# Patient Record
Sex: Female | Born: 1970
Health system: Southern US, Community
[De-identification: ages and names within clinical notes are randomized; demographics above are authoritative.]

## PROBLEM LIST (undated history)

## (undated) DIAGNOSIS — I10 Essential (primary) hypertension: Secondary | ICD-10-CM

## (undated) DIAGNOSIS — R519 Headache, unspecified: Secondary | ICD-10-CM

## (undated) DIAGNOSIS — E559 Vitamin D deficiency, unspecified: Secondary | ICD-10-CM

## (undated) DIAGNOSIS — Z8489 Family history of other specified conditions: Secondary | ICD-10-CM

## (undated) DIAGNOSIS — M549 Dorsalgia, unspecified: Secondary | ICD-10-CM

## (undated) DIAGNOSIS — T4145XA Adverse effect of unspecified anesthetic, initial encounter: Secondary | ICD-10-CM

## (undated) DIAGNOSIS — K760 Fatty (change of) liver, not elsewhere classified: Secondary | ICD-10-CM

## (undated) DIAGNOSIS — D649 Anemia, unspecified: Secondary | ICD-10-CM

## (undated) DIAGNOSIS — M069 Rheumatoid arthritis, unspecified: Secondary | ICD-10-CM

## (undated) DIAGNOSIS — J189 Pneumonia, unspecified organism: Secondary | ICD-10-CM

## (undated) DIAGNOSIS — F419 Anxiety disorder, unspecified: Secondary | ICD-10-CM

## (undated) DIAGNOSIS — F988 Other specified behavioral and emotional disorders with onset usually occurring in childhood and adolescence: Secondary | ICD-10-CM

## (undated) DIAGNOSIS — T8859XA Other complications of anesthesia, initial encounter: Secondary | ICD-10-CM

## (undated) DIAGNOSIS — R51 Headache: Secondary | ICD-10-CM

## (undated) DIAGNOSIS — D219 Benign neoplasm of connective and other soft tissue, unspecified: Secondary | ICD-10-CM

## (undated) DIAGNOSIS — M797 Fibromyalgia: Secondary | ICD-10-CM

## (undated) HISTORY — DX: Vitamin D deficiency, unspecified: E55.9

## (undated) HISTORY — DX: Fatty (change of) liver, not elsewhere classified: K76.0

## (undated) HISTORY — DX: Other specified behavioral and emotional disorders with onset usually occurring in childhood and adolescence: F98.8

## (undated) HISTORY — DX: Morbid (severe) obesity due to excess calories: E66.01

## (undated) HISTORY — DX: Benign neoplasm of connective and other soft tissue, unspecified: D21.9

## (undated) HISTORY — DX: Anxiety disorder, unspecified: F41.9

## (undated) HISTORY — PX: BREAST BIOPSY: SHX20

## (undated) HISTORY — PX: CARPAL TUNNEL RELEASE: SHX101

## (undated) HISTORY — DX: Rheumatoid arthritis, unspecified: M06.9

## (undated) HISTORY — PX: WISDOM TOOTH EXTRACTION: SHX21

## (undated) HISTORY — DX: Fibromyalgia: M79.7

---

## 2011-02-17 DIAGNOSIS — M549 Dorsalgia, unspecified: Secondary | ICD-10-CM | POA: Insufficient documentation

## 2011-02-17 DIAGNOSIS — Z6841 Body Mass Index (BMI) 40.0 and over, adult: Secondary | ICD-10-CM | POA: Insufficient documentation

## 2012-01-16 LAB — BASIC METABOLIC PANEL: BUN: 10 mg/dL (ref 4–21)

## 2012-01-16 LAB — HEPATIC FUNCTION PANEL
Alkaline Phosphatase: 73 U/L (ref 25–125)
Bilirubin, Total: 0.2 mg/dL

## 2012-01-16 LAB — HEMOGLOBIN A1C: Hgb A1c MFr Bld: 5.4 % (ref 4.0–6.0)

## 2012-12-19 ENCOUNTER — Ambulatory Visit (INDEPENDENT_AMBULATORY_CARE_PROVIDER_SITE_OTHER): Payer: BC Managed Care – PPO | Admitting: Family Medicine

## 2012-12-19 ENCOUNTER — Encounter: Payer: Self-pay | Admitting: Family Medicine

## 2012-12-19 VITALS — BP 124/87 | HR 87 | Ht 63.0 in | Wt 228.0 lb

## 2012-12-19 DIAGNOSIS — M5416 Radiculopathy, lumbar region: Secondary | ICD-10-CM | POA: Insufficient documentation

## 2012-12-19 DIAGNOSIS — G8929 Other chronic pain: Secondary | ICD-10-CM

## 2012-12-19 DIAGNOSIS — M47817 Spondylosis without myelopathy or radiculopathy, lumbosacral region: Secondary | ICD-10-CM | POA: Insufficient documentation

## 2012-12-19 DIAGNOSIS — Z1231 Encounter for screening mammogram for malignant neoplasm of breast: Secondary | ICD-10-CM

## 2012-12-19 DIAGNOSIS — M5430 Sciatica, unspecified side: Secondary | ICD-10-CM

## 2012-12-19 DIAGNOSIS — M5431 Sciatica, right side: Secondary | ICD-10-CM

## 2012-12-19 DIAGNOSIS — M533 Sacrococcygeal disorders, not elsewhere classified: Secondary | ICD-10-CM | POA: Insufficient documentation

## 2012-12-19 DIAGNOSIS — M545 Low back pain: Secondary | ICD-10-CM

## 2012-12-19 MED ORDER — GABAPENTIN 300 MG PO CAPS: ORAL_CAPSULE | ORAL | Status: DC

## 2012-12-19 MED ORDER — HYDROCODONE-ACETAMINOPHEN 5-325 MG PO TABS: 1.0000 | ORAL_TABLET | Freq: Three times a day (TID) | ORAL | Status: DC | PRN

## 2012-12-19 MED ORDER — PREDNISONE 20 MG PO TABS: 40.0000 mg | ORAL_TABLET | Freq: Every day | ORAL | Status: DC
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
## 2012-12-25 ENCOUNTER — Encounter: Payer: Self-pay | Admitting: Family Medicine

## 2012-12-25 DIAGNOSIS — D259 Leiomyoma of uterus, unspecified: Secondary | ICD-10-CM | POA: Insufficient documentation

## 2012-12-26 ENCOUNTER — Ambulatory Visit (INDEPENDENT_AMBULATORY_CARE_PROVIDER_SITE_OTHER): Payer: BC Managed Care – PPO | Admitting: Physical Therapy

## 2012-12-26 ENCOUNTER — Encounter: Payer: Self-pay | Admitting: *Deleted

## 2012-12-26 DIAGNOSIS — M2550 Pain in unspecified joint: Secondary | ICD-10-CM

## 2012-12-26 DIAGNOSIS — G8929 Other chronic pain: Secondary | ICD-10-CM

## 2012-12-26 DIAGNOSIS — M2560 Stiffness of unspecified joint, not elsewhere classified: Secondary | ICD-10-CM

## 2012-12-26 DIAGNOSIS — M545 Low back pain: Secondary | ICD-10-CM

## 2012-12-31 ENCOUNTER — Ambulatory Visit (HOSPITAL_BASED_OUTPATIENT_CLINIC_OR_DEPARTMENT_OTHER)
Admission: RE | Admit: 2012-12-31 | Discharge: 2012-12-31 | Disposition: A | Discharge status: Home / Self Care | Payer: BC Managed Care – PPO | Source: Ambulatory Visit | Attending: Family Medicine | Admitting: Family Medicine

## 2012-12-31 DIAGNOSIS — R928 Other abnormal and inconclusive findings on diagnostic imaging of breast: Secondary | ICD-10-CM | POA: Insufficient documentation

## 2012-12-31 DIAGNOSIS — Z1231 Encounter for screening mammogram for malignant neoplasm of breast: Secondary | ICD-10-CM

## 2013-01-02 ENCOUNTER — Ambulatory Visit (INDEPENDENT_AMBULATORY_CARE_PROVIDER_SITE_OTHER): Payer: BC Managed Care – PPO | Admitting: Physical Therapy

## 2013-01-02 DIAGNOSIS — G8929 Other chronic pain: Secondary | ICD-10-CM

## 2013-01-02 DIAGNOSIS — M545 Low back pain: Secondary | ICD-10-CM

## 2013-01-02 DIAGNOSIS — M2560 Stiffness of unspecified joint, not elsewhere classified: Secondary | ICD-10-CM

## 2013-01-02 DIAGNOSIS — M2550 Pain in unspecified joint: Secondary | ICD-10-CM

## 2013-01-10 ENCOUNTER — Encounter: Payer: BC Managed Care – PPO | Admitting: Physical Therapy

## 2013-01-14 ENCOUNTER — Encounter: Payer: BC Managed Care – PPO | Admitting: Physical Therapy

## 2013-01-16 ENCOUNTER — Encounter: Payer: BC Managed Care – PPO | Admitting: Physical Therapy

## 2013-01-19 ENCOUNTER — Emergency Department
Admission: EM | Admit: 2013-01-19 | Discharge: 2013-01-19 | Disposition: A | Discharge status: Home / Self Care | Payer: BC Managed Care – PPO | Source: Home / Self Care | Attending: Family Medicine | Admitting: Family Medicine

## 2013-01-19 ENCOUNTER — Encounter: Payer: Self-pay | Admitting: Emergency Medicine

## 2013-01-19 DIAGNOSIS — J069 Acute upper respiratory infection, unspecified: Secondary | ICD-10-CM

## 2013-01-19 HISTORY — DX: Dorsalgia, unspecified: M54.9

## 2013-01-19 MED ORDER — AMOXICILLIN 875 MG PO TABS: 875.0000 mg | ORAL_TABLET | Freq: Two times a day (BID) | ORAL | Status: DC

## 2013-01-19 MED ORDER — BENZONATATE 100 MG PO CAPS: ORAL_CAPSULE | ORAL | Status: DC

## 2013-01-19 NOTE — ED Provider Notes (Signed)
CSN: [id number]     Arrival date & time 01/19/13  0932 History   First MD Initiated Contact with Patient 01/19/13 [phone]     Chief Complaint  Patient presents with  . Facial Pain      HPI Comments: Patient complains of onset of cold-like symptoms 6 days ago with sinus congestion, followed by sore throat and a cough.  The sore throat resolved yesterday, and she still has the cough. She states that she has a history of pneumonia, with last episode January 2014.  She has had pneumonia about 5 times since 2003.  The history is provided by the patient.    Past Medical History  Diagnosis Date  . Back pain    Past Surgical History  Procedure Laterality Date  . Cesarean section  2000   Family History  Problem Relation Age of Onset  . Hypertension    . Diabetes Father    History  Substance Use Topics  . Smoking status: Former Smoker    Types: Cigarettes    Quit date: 04/20/2010  . Smokeless tobacco: Not on file  . Alcohol Use: Yes     Comment: social   OB History   Grav Para Term Preterm Abortions TAB SAB Ect Mult Living                 Review of Systems + sore throat, resolved + cough No pleuritic pain No wheezing + nasal congestion + post-nasal drainage + sinus pain/pressure + itchy/red eyes ? earache No hemoptysis No SOB No fever, + chills No nausea No vomiting No abdominal pain No diarrhea No urinary symptoms No skin rashes + fatigue No myalgias + headache Used OTC meds without relief  Allergies  Review of patient's allergies indicates no known allergies.  Home Medications   Current Outpatient Rx  Name  Route  Sig  Dispense  Refill  . amoxicillin (AMOXIL) 875 MG tablet   Oral   Take 1 tablet (875 mg total) by mouth 2 (two) times daily. (Rx void after 01/27/13)   20 tablet   0   . benzonatate (TESSALON) 100 MG capsule      Take one cap at bedtime as necessary for cough   12 capsule   0   . gabapentin (NEURONTIN) 300 MG capsule      Once a  day at bedtime x 1 week, then BID x 1 week, then TID   90 capsule   3   . HYDROcodone-acetaminophen (NORCO/VICODIN) 5-325 MG per tablet   Oral   Take 1 tablet by mouth every 8 (eight) hours as needed for pain.   30 tablet   0   . predniSONE (DELTASONE) 20 MG tablet   Oral   Take 2 tablets (40 mg total) by mouth daily.   10 tablet   0    BP 133/82  Pulse 96  Temp(Src) 98.1 F (36.7 C) (Oral)  Ht 5\' 3"  (1.6 m)  Wt 229 lb (103.874 kg)  BMI 40.58 kg/m2  SpO2 97% Physical Exam Nursing notes and Vital Signs reviewed. Appearance:  Patient appears stated age, and in no acute distress.  Patient is obese (BMI 40.6) Eyes:  Pupils are equal, round, and reactive to light and accomodation.  Extraocular movement is intact.  Conjunctivae are not inflamed  Ears:  Canals normal.  Tympanic membranes normal.  Nose:  Mildly congested turbinates.  No sinus tenderness.   Pharynx:  Normal Neck:  Supple.  Tender shotty posterior nodes are  palpated bilaterally  Lungs:  Clear to auscultation.  Breath sounds are equal. Chest:  Distinct tenderness to palpation over the mid-sternum.   Heart:  Regular rate and rhythm without murmurs, rubs, or gallops.  Abdomen:  Nontender without masses or hepatosplenomegaly.  Bowel sounds are present.  No CVA or flank tenderness.  Extremities:  No edema.  No calf tenderness Skin:  No rash present.   ED Course  Procedures  none       MDM   1. Acute upper respiratory infections of unspecified site; suspect viral URI    There is no evidence of bacterial infection today.   Treat symptomatically for now:  Prescription written for Benzonatate [facility]) to take at bedtime for night-time cough.  Take Mucinex D (guaifenesin with decongestant) twice daily for congestion, or plain Mucinex plus Sudafed.  Increase fluid intake, rest. May use Afrin nasal spray (or generic oxymetazoline) twice daily for about 5 days.  Also recommend using saline nasal spray several times  daily and saline nasal irrigation (AYR is a common brand) Stop all antihistamines for now, and other non-prescription cough/cold preparations. Begin Amoxicillin if not improving about one week or if persistent fever develops (Given a prescription to hold, with an expiration date)  Follow-up with family doctor if not improving about10 days.     Lattie Haw, MD 01/19/13 1027

## 2013-01-19 NOTE — ED Notes (Signed)
States she has not travel internationally. Noted sinus congestion on Monday and productive cough.

## 2013-01-21 ENCOUNTER — Encounter: Payer: BC Managed Care – PPO | Admitting: Physical Therapy

## 2013-01-23 ENCOUNTER — Encounter: Payer: BC Managed Care – PPO | Admitting: Physical Therapy

## 2013-01-25 ENCOUNTER — Other Ambulatory Visit: Payer: Self-pay | Admitting: Family Medicine

## 2013-01-25 DIAGNOSIS — R928 Other abnormal and inconclusive findings on diagnostic imaging of breast: Secondary | ICD-10-CM

## 2013-02-13 ENCOUNTER — Other Ambulatory Visit: Payer: Self-pay | Admitting: Radiology

## 2013-02-13 ENCOUNTER — Ambulatory Visit
Admission: RE | Admit: 2013-02-13 | Discharge: 2013-02-13 | Disposition: A | Discharge status: Home / Self Care | Payer: BC Managed Care – PPO | Source: Ambulatory Visit | Attending: Family Medicine | Admitting: Family Medicine

## 2013-02-13 ENCOUNTER — Ambulatory Visit: Payer: BC Managed Care – PPO | Admitting: Family Medicine

## 2013-02-13 ENCOUNTER — Other Ambulatory Visit: Payer: Self-pay | Admitting: Family Medicine

## 2013-02-13 DIAGNOSIS — R928 Other abnormal and inconclusive findings on diagnostic imaging of breast: Secondary | ICD-10-CM

## 2013-02-13 DIAGNOSIS — N6310 Unspecified lump in the right breast, unspecified quadrant: Secondary | ICD-10-CM

## 2013-02-20 ENCOUNTER — Ambulatory Visit (INDEPENDENT_AMBULATORY_CARE_PROVIDER_SITE_OTHER): Payer: BC Managed Care – PPO | Admitting: Family Medicine

## 2013-02-20 ENCOUNTER — Encounter: Payer: Self-pay | Admitting: Family Medicine

## 2013-02-20 VITALS — BP 132/76 | HR 98 | Temp 98.3°F | Wt 229.0 lb

## 2013-02-20 DIAGNOSIS — Z23 Encounter for immunization: Secondary | ICD-10-CM

## 2013-02-20 DIAGNOSIS — M545 Low back pain, unspecified: Secondary | ICD-10-CM

## 2013-02-20 DIAGNOSIS — G8929 Other chronic pain: Secondary | ICD-10-CM

## 2013-02-20 MED ORDER — HYDROCODONE-ACETAMINOPHEN 7.5-325 MG PO TABS: 1.0000 | ORAL_TABLET | Freq: Two times a day (BID) | ORAL | Status: DC | PRN

## 2013-02-20 NOTE — Progress Notes (Signed)
  Subjective:    Patient ID: Elizabeth Copeland, female    DOB: 1970-12-15, 42 y.o.   MRN: [id number]  HPI Acute on chronic back pain - estab care here 2 months ago for chronic low back pain. Started her back on neurontin. Had used it before. Given 30 hydrocodone to use over 2 month period. Had warned about dependency. Started PT and felt worse and was getting HA so quit going.  She feels better some. No longer radiating down her leg as well.  The weather change has exacerbated her sxs. Taking the neurontin BID. Feels like she is back to her baseline for her pain.     Review of Systems     Objective:   Physical Exam  Constitutional: She is oriented to person, place, and time. She appears well-developed and well-nourished.  HENT:  Head: Normocephalic and atraumatic.  Cardiovascular: Normal rate, regular rhythm and normal heart sounds.   Pulmonary/Chest: Effort normal and breath sounds normal.  Musculoskeletal:  Normall flexion, extension, rotation right and left.  + straight leg raise on the right.  Patellar reflexes 2+ bilat.    Neurological: She is alert and oriented to person, place, and time.  Skin: Skin is warm and dry.  Psychiatric: She has a normal mood and affect. Her behavior is normal.          Assessment & Plan:  Chronic low back pain - Unable to complete PT. Has been seeing chiropractor on and off.  Has been using her hydrocodone sparingly but says doesn't isn't as effective. Will inc hydrocodone to 7.5 mg. Given 60 tabs today.  Continue neurontin BID. Ok ot increase to TID if needed. Work on stretches and exercise.  Recommend heating pad at bedtime.  Can still use an NSAID prn on top of meds for pain relief as well. Her radicular sxs on the right are not completely resolved but are better.   Given flu shot today.

## 2013-04-10 ENCOUNTER — Encounter: Payer: Self-pay | Admitting: Family Medicine

## 2013-04-10 ENCOUNTER — Ambulatory Visit (INDEPENDENT_AMBULATORY_CARE_PROVIDER_SITE_OTHER): Payer: BC Managed Care – PPO | Admitting: Family Medicine

## 2013-04-10 VITALS — BP 119/73 | HR 86 | Temp 98.2°F | Wt 226.0 lb

## 2013-04-10 DIAGNOSIS — R5383 Other fatigue: Secondary | ICD-10-CM

## 2013-04-10 DIAGNOSIS — R300 Dysuria: Secondary | ICD-10-CM

## 2013-04-10 DIAGNOSIS — R5381 Other malaise: Secondary | ICD-10-CM

## 2013-04-10 DIAGNOSIS — Z0000 Encounter for general adult medical examination without abnormal findings: Secondary | ICD-10-CM

## 2013-04-10 LAB — POCT URINALYSIS DIPSTICK
Bilirubin, UA: NEGATIVE
GLUCOSE UA: NEGATIVE
Ketones, UA: NEGATIVE
LEUKOCYTES UA: NEGATIVE
NITRITE UA: NEGATIVE
Protein, UA: NEGATIVE
Spec Grav, UA: 1.025
Urobilinogen, UA: 0.2
pH, UA: 6

## 2013-04-10 MED ORDER — GABAPENTIN 300 MG PO CAPS: ORAL_CAPSULE | ORAL | Status: DC

## 2013-04-10 MED ORDER — HYDROCODONE-ACETAMINOPHEN 7.5-325 MG PO TABS: 1.0000 | ORAL_TABLET | Freq: Two times a day (BID) | ORAL | Status: DC | PRN

## 2013-04-10 NOTE — Addendum Note (Signed)
Addended by: Teddy Spike on: 04/10/2013 11:22 AM   Modules accepted: Orders

## 2013-04-10 NOTE — Progress Notes (Signed)
Subjective:     Elizabeth Copeland is a 43 y.o. female and is here for a comprehensive physical exam. The patient reports no problems.  History   Social History  . Marital Status: Single    Spouse Name: N/A    Number of Children: 1  . Years of Education: 2 yr coll   Occupational History  . member [employer]   Social History Main Topics  . Smoking status: Former Smoker    Types: Cigarettes    Quit date: 04/20/2010  . Smokeless tobacco: Not on file  . Alcohol Use: Yes     Comment: social  . Drug Use: No  . Sexual Activity: Not Currently   Other Topics Concern  . Not on file   Social History Narrative   No regular exercise.  Lives with her mother [name] and father Laurren Lepkowski   Health Maintenance  Topic Date Due  . Pap Smear  03/21/1987  . Influenza Vaccine  11/02/2013  . Tetanus/tdap  02/21/2023    The following portions of the patient's history were reviewed and updated as appropriate: allergies, current medications, past family history, past medical history, past social history, past surgical history and problem list.  Review of Systems   A comprehensive review of systems was negative.   Objective:    BP 119/73  Pulse 86  Temp(Src) 98.2 F (36.8 C)  Wt 226 lb (102.513 kg) General appearance: alert, cooperative and appears stated age Head: Normocephalic, without obvious abnormality, atraumatic Eyes: conj clear, EOMi, PEERLA Ears: normal TM's and external ear canals both ears Nose: Nares normal. Septum midline. Mucosa normal. No drainage or sinus tenderness. Throat: lips, mucosa, and tongue normal; teeth and gums normal Neck: no adenopathy, no carotid bruit, no JVD, supple, symmetrical, trachea midline and thyroid not enlarged, symmetric, no tenderness/mass/nodules Back: symmetric, no curvature. ROM normal. No CVA tenderness. Lungs: clear to auscultation bilaterally Heart: regular rate and rhythm, S1, S2 normal, no  murmur, click, rub or gallop Abdomen: soft, non-tender; bowel sounds normal; no masses,  no organomegaly Extremities: extremities normal, atraumatic, no cyanosis or edema Pulses: 2+ and symmetric Skin: Skin color, texture, turgor normal. No rashes or lesions Lymph nodes: Cervical, supraclavicular, and axillary nodes normal. Neurologic: Alert and oriented X 3, normal strength and tone. Normal symmetric reflexes. Normal coordination and gait    Assessment:    Healthy female exam.      Plan:     See After Visit Summary for Counseling Recommendations  Keep up a regular exercise program and make sure you are eating a healthy diet Try to eat 4 servings of dairy a day, or if you are lactose intolerant take a calcium with vitamin D daily.  Your vaccines are up to date.  Check CMP and fasting lipid panel.   dysuria-she has had some dysuria on and off over the last year. We'll check a urinalysis today. No hematuria. She does have uterine fibroids which sometimes cause discomfort and pain and she has heavy bleeding with irregular cycles for which she sees her OB/GYN.  Back Pain - refilled hydrocodone today. F/U in 3 months.   Fatigue - she had a viral illness about a month ago. She says it was an upper respiratory infection. It finally resolved but since then she's had on and off fatigue, joint aches, and myalgias. She said to be fine for a few days and then will come home from work until completely exhausted. It would  then last for a few days and then seemed to improve. I explained her that sometimes illnesses can have longer lasting effects. For example cytomegalovirus and Epstein-Barr virus can actually last for 3 months. Encouraged her to give this another month or 2 disease she continues to improve. She's been afebrile. We will check a CBC with differential today as well as a thyroid level.

## 2013-04-10 NOTE — Patient Instructions (Signed)
Keep up a regular exercise program and make sure you are eating a healthy diet Try to eat 4 servings of dairy a day, or if you are lactose intolerant take a calcium with vitamin D daily.  Your vaccines are up to date.   

## 2013-04-11 ENCOUNTER — Other Ambulatory Visit: Payer: Self-pay | Admitting: Family Medicine

## 2013-04-17 ENCOUNTER — Telehealth: Payer: Self-pay | Admitting: *Deleted

## 2013-04-17 ENCOUNTER — Ambulatory Visit (INDEPENDENT_AMBULATORY_CARE_PROVIDER_SITE_OTHER): Payer: BC Managed Care – PPO

## 2013-04-17 ENCOUNTER — Encounter: Payer: Self-pay | Admitting: Sports Medicine

## 2013-04-17 ENCOUNTER — Other Ambulatory Visit: Payer: Self-pay | Admitting: Family Medicine

## 2013-04-17 ENCOUNTER — Ambulatory Visit (INDEPENDENT_AMBULATORY_CARE_PROVIDER_SITE_OTHER): Payer: BC Managed Care – PPO | Admitting: Sports Medicine

## 2013-04-17 VITALS — BP 125/74 | HR 84 | Wt 227.0 lb

## 2013-04-17 DIAGNOSIS — M79609 Pain in unspecified limb: Secondary | ICD-10-CM

## 2013-04-17 DIAGNOSIS — M5416 Radiculopathy, lumbar region: Secondary | ICD-10-CM

## 2013-04-17 DIAGNOSIS — M545 Low back pain, unspecified: Secondary | ICD-10-CM

## 2013-04-17 DIAGNOSIS — IMO0002 Reserved for concepts with insufficient information to code with codable children: Secondary | ICD-10-CM

## 2013-04-17 LAB — LIPID PANEL
CHOLESTEROL: 205 mg/dL — AB (ref 0–200)
HDL: 29 mg/dL — ABNORMAL LOW (ref >39)
LDL Cholesterol: 137 mg/dL — ABNORMAL HIGH (ref 0–99)
TRIGLYCERIDES: 196 mg/dL — AB (ref <150)
Total CHOL/HDL Ratio: 7.1 Ratio
VLDL: 39 mg/dL (ref 0–40)

## 2013-04-17 LAB — COMPLETE METABOLIC PANEL WITH GFR
ALT: 14 U/L (ref 0–35)
AST: 16 U/L (ref 0–37)
Albumin: 4 g/dL (ref 3.5–5.2)
Alkaline Phosphatase: 53 U/L (ref 39–117)
BUN: 12 mg/dL (ref 6–23)
CALCIUM: 8.9 mg/dL (ref 8.4–10.5)
CHLORIDE: 106 meq/L (ref 96–112)
CO2: 24 mEq/L (ref 19–32)
CREATININE: 0.78 mg/dL (ref 0.50–1.10)
GFR, Est African American: >89 mL/min
GFR, Est Non African American: >89 mL/min
Glucose, Bld: 79 mg/dL (ref 70–99)
Potassium: 4.4 mEq/L (ref 3.5–5.3)
Sodium: 137 mEq/L (ref 135–145)
Total Bilirubin: 0.5 mg/dL (ref 0.3–1.2)
Total Protein: 6.7 g/dL (ref 6.0–8.3)

## 2013-04-17 LAB — CBC
HCT: 30.8 % — ABNORMAL LOW (ref 36.0–46.0)
Hemoglobin: 9.5 g/dL — ABNORMAL LOW (ref 12.0–15.0)
MCH: 20.3 pg — ABNORMAL LOW (ref 26.0–34.0)
MCHC: 30.8 g/dL (ref 30.0–36.0)
MCV: 66 fL — AB (ref 78.0–100.0)
Platelets: 383 10*3/uL (ref 150–400)
RBC: 4.67 MIL/uL (ref 3.87–5.11)
RDW: 17.2 % — ABNORMAL HIGH (ref 11.5–15.5)
WBC: 7.3 10*3/uL (ref 4.0–10.5)

## 2013-04-17 LAB — TSH: TSH: 2.686 u[IU]/mL (ref 0.350–4.500)

## 2013-04-17 MED ORDER — MELOXICAM 15 MG PO TABS: ORAL_TABLET | ORAL | Status: DC

## 2013-04-17 NOTE — Telephone Encounter (Signed)
No precert is needed for MRI Lumbar Spine w/o contrast per [facility].  Helen with imaging notified. Clemetine Marker, LPN

## 2013-04-17 NOTE — Progress Notes (Signed)
   Subjective:    I'm seeing this patient as a consultation for:  Dr. Madilyn Fireman  CC: Low back pain  HPI: This is a very pleasant 43 year old female, she is the daughter of Aurielle Slingerland and Britiny Defrain who I am also seeing. She was referred to me for persistent low back pain despite conservative measures. Back pain has been present for decades, is worse with sitting and with Valsalva and tends to radiate down the right leg in an L5 versus an S1 nerve distribution. She is currently taking low dose gabapentin at approximately 300 mg twice a day, and is currently on low-dose narcotics at 5/325 mg of Norco. Unfortunately symptoms continue to be persistent. She denies any constitutional symptoms denies any bowel or bladder dysfunction.  Past medical history, Surgical history, Family history not pertinant except as noted below, Social history, Allergies, and medications have been entered into the medical record, reviewed, and no changes needed.   Review of Systems: No headache, visual changes, nausea, vomiting, diarrhea, constipation, dizziness, abdominal pain, skin rash, fevers, chills, night sweats, weight loss, swollen lymph nodes, body aches, joint swelling, muscle aches, chest pain, shortness of breath, mood changes, visual or auditory hallucinations.   Objective:   General: Well Developed, well nourished, and in no acute distress.  Neuro/Psych: Alert and oriented x3, extra-ocular muscles intact, able to move all 4 extremities, sensation grossly intact. Skin: Warm and dry, no rashes noted.  Respiratory: Not using accessory muscles, speaking in full sentences, trachea midline.  Cardiovascular: Pulses palpable, no extremity edema. Abdomen: Does not appear distended. Back Exam:  Inspection: Unremarkable  Motion: Flexion 45 deg, Extension 45 deg, Side Bending to 45 deg bilaterally,  Rotation to 45 deg bilaterally  SLR laying: Negative  XSLR laying: Negative  Palpable tenderness:  None. FABER: negative. Sensory change: Gross sensation intact to all lumbar and sacral dermatomes.  Reflexes: 2+ at both patellar tendons, 2+ at achilles tendons, Babinski's downgoing.  Strength at foot  Plantar-flexion: 5/5 Dorsi-flexion: 5/5 Eversion: 5/5 Inversion: 5/5  Leg strength  Quad: 5/5 Hamstring: 5/5 Hip flexor: 5/5 Hip abductors: 5/5  Gait unremarkable.  Lumbar spine x-rays were personally reviewed, there do appear to be very mild endplate reactive changes, there is also what appears to be very mild retrolisthesis of L5 on S1.  Impression and Recommendations:   This case required medical decision making of moderate complexity.

## 2013-04-17 NOTE — Assessment & Plan Note (Signed)
Likely L5-S1. At this point she has had several years of symptoms and has failed physical therapy, low-dose gabapentin, and is currently on low dose narcotics. We are going to proceed with an MRI for interventional injection planning. I am also going to increase her gabapentin. Return to see me go over results of the MRI.

## 2013-04-18 LAB — VITAMIN D 25 HYDROXY (VIT D DEFICIENCY, FRACTURES): Vit D, 25-Hydroxy: 23 ng/mL — ABNORMAL LOW (ref 30–89)

## 2013-04-18 LAB — FERRITIN: Ferritin: 2 ng/mL — ABNORMAL LOW (ref 10–291)

## 2013-04-18 LAB — FOLATE: FOLATE: 6.5 ng/mL

## 2013-04-18 LAB — VITAMIN B12: Vitamin B-12: 470 pg/mL (ref 211–911)

## 2013-04-23 ENCOUNTER — Ambulatory Visit (HOSPITAL_BASED_OUTPATIENT_CLINIC_OR_DEPARTMENT_OTHER)
Admission: RE | Admit: 2013-04-23 | Discharge: 2013-04-23 | Disposition: A | Discharge status: Home / Self Care | Payer: BC Managed Care – PPO | Source: Ambulatory Visit | Attending: Sports Medicine | Admitting: Sports Medicine

## 2013-04-23 DIAGNOSIS — R1909 Other intra-abdominal and pelvic swelling, mass and lump: Secondary | ICD-10-CM | POA: Insufficient documentation

## 2013-04-23 DIAGNOSIS — M5416 Radiculopathy, lumbar region: Secondary | ICD-10-CM

## 2013-04-23 DIAGNOSIS — M48061 Spinal stenosis, lumbar region without neurogenic claudication: Secondary | ICD-10-CM | POA: Insufficient documentation

## 2013-04-23 DIAGNOSIS — M47817 Spondylosis without myelopathy or radiculopathy, lumbosacral region: Secondary | ICD-10-CM | POA: Insufficient documentation

## 2013-04-23 DIAGNOSIS — M5126 Other intervertebral disc displacement, lumbar region: Secondary | ICD-10-CM | POA: Insufficient documentation

## 2013-04-23 DIAGNOSIS — M545 Low back pain, unspecified: Secondary | ICD-10-CM | POA: Insufficient documentation

## 2013-05-01 ENCOUNTER — Encounter: Payer: Self-pay | Admitting: Sports Medicine

## 2013-05-01 ENCOUNTER — Ambulatory Visit (INDEPENDENT_AMBULATORY_CARE_PROVIDER_SITE_OTHER): Payer: BC Managed Care – PPO | Admitting: Sports Medicine

## 2013-05-01 VITALS — BP 125/76 | HR 90 | Ht 62.0 in | Wt 230.0 lb

## 2013-05-01 DIAGNOSIS — D259 Leiomyoma of uterus, unspecified: Secondary | ICD-10-CM

## 2013-05-01 DIAGNOSIS — IMO0002 Reserved for concepts with insufficient information to code with codable children: Secondary | ICD-10-CM

## 2013-05-01 DIAGNOSIS — M5416 Radiculopathy, lumbar region: Secondary | ICD-10-CM

## 2013-05-01 MED ORDER — GABAPENTIN 800 MG PO TABS: 800.0000 mg | ORAL_TABLET | Freq: Three times a day (TID) | ORAL | Status: DC

## 2013-05-01 NOTE — Progress Notes (Signed)
  Subjective:    CC: Followup  HPI: Low back pain: Present for years, discogenic with right-sided L5 radicular symptoms. She had failed conservative measures including physical therapy, NSAIDs, steroids. We recently obtained an MRI, and she is here to followup results. Pain is moderate, persistent. She is having a very good response to gabapentin near the max dose. She does tell me that her goal would be to get off of some of the pills.  Past medical history, Surgical history, Family history not pertinant except as noted below, Social history, Allergies, and medications have been entered into the medical record, reviewed, and no changes needed.   Review of Systems: No fevers, chills, night sweats, weight loss, chest pain, or shortness of breath.   Objective:    General: Well Developed, well nourished, and in no acute distress.  Neuro: Alert and oriented x3, extra-ocular muscles intact, sensation grossly intact.  HEENT: Normocephalic, atraumatic, pupils equal round reactive to light, neck supple, no masses, no lymphadenopathy, thyroid nonpalpable.  Skin: Warm and dry, no rashes. Cardiac: Regular rate and rhythm, no murmurs rubs or gallops, no lower extremity edema.  Respiratory: Clear to auscultation bilaterally. Not using accessory muscles, speaking in full sentences.  MRI was reviewed, there is L4-L5 degenerative disc disease with a small broad-based protrusion, she has worsened degenerative changes at the L5-S1 level with mild grade 1 retrolisthesis of L5 on S1, with a midline as well as foraminal disc protrusion left worse than right. Incidentally noted was a large pelvic mass suggestive of fibroids as well as low bone marrow signal intensity partly explained by her anemia.   Impression and Recommendations:

## 2013-05-01 NOTE — Assessment & Plan Note (Signed)
MRI was personally reviewed and shows multilevel degenerative disc disease worse at the L4-L5 and at the L5-S1 level. The L5-S1 level does show a midline disc protrusion causing bilateral foraminal stenosis, left worse than right. At this point we are going to proceed with a right-sided L5-S1 interlaminar epidural steroid injection. I am also going to have her continue her Mobic, and we are going to switch gabapentin to the 800 mg form. 11 see her back in about 2 weeks after her injection.

## 2013-05-01 NOTE — Assessment & Plan Note (Signed)
Persistent menorrhagia with anemia, and large uterine fibroids, I do think that she would be a good candidate for combined oral contraceptive treatment. It would be nice to have a period once every 3 months, and correct the anemia. I surely would like her to speak with some of our OB/GYN, and I will provide referral.

## 2013-05-08 ENCOUNTER — Ambulatory Visit
Admission: RE | Admit: 2013-05-08 | Discharge: 2013-05-08 | Disposition: A | Discharge status: Home / Self Care | Payer: BC Managed Care – PPO | Source: Ambulatory Visit | Attending: Sports Medicine | Admitting: Sports Medicine

## 2013-05-08 VITALS — BP 179/110 | HR 88

## 2013-05-08 DIAGNOSIS — M5416 Radiculopathy, lumbar region: Secondary | ICD-10-CM

## 2013-05-08 MED ORDER — IOHEXOL 180 MG/ML  SOLN
1.0000 mL | Freq: Once | INTRAMUSCULAR | Status: AC | PRN
  Administered 2013-05-08: 1 mL via EPIDURAL

## 2013-05-08 MED ORDER — METHYLPREDNISOLONE ACETATE 40 MG/ML INJ SUSP (RADIOLOG
120.0000 mg | Freq: Once | INTRAMUSCULAR | Status: AC
  Administered 2013-05-08: 120 mg via EPIDURAL

## 2013-05-08 NOTE — Discharge Instructions (Signed)

## 2013-05-13 ENCOUNTER — Telehealth: Payer: Self-pay

## 2013-05-13 NOTE — Telephone Encounter (Signed)
Left message for patient to give our office a call to set up appointment per Dr. Dianah Field referral.

## 2013-05-22 ENCOUNTER — Ambulatory Visit: Payer: BC Managed Care – PPO | Admitting: Family Medicine

## 2013-05-22 DIAGNOSIS — Z0289 Encounter for other administrative examinations: Secondary | ICD-10-CM

## 2013-05-29 ENCOUNTER — Ambulatory Visit (INDEPENDENT_AMBULATORY_CARE_PROVIDER_SITE_OTHER): Payer: BC Managed Care – PPO | Admitting: Sports Medicine

## 2013-05-29 ENCOUNTER — Encounter: Payer: Self-pay | Admitting: Sports Medicine

## 2013-05-29 VITALS — BP 131/78 | HR 88 | Ht 63.0 in | Wt 230.0 lb

## 2013-05-29 DIAGNOSIS — IMO0002 Reserved for concepts with insufficient information to code with codable children: Secondary | ICD-10-CM

## 2013-05-29 DIAGNOSIS — M5416 Radiculopathy, lumbar region: Secondary | ICD-10-CM

## 2013-05-29 NOTE — Progress Notes (Signed)
  Subjective:    CC: Follow up  HPI: Lumbar radiculopathy: right-sided, L5, failed physical therapy, steroids, NSAIDs, gabapentin was effective but insufficiently, she recently had a right-sided L5-S1 interlaminar epidural and returns pain-free.  Past medical history, Surgical history, Family history not pertinant except as noted below, Social history, Allergies, and medications have been entered into the medical record, reviewed, and no changes needed.   Review of Systems: No fevers, chills, night sweats, weight loss, chest pain, or shortness of breath.   Objective:    General: Well Developed, well nourished, and in no acute distress.  Neuro: Alert and oriented x3, extra-ocular muscles intact, sensation grossly intact.  HEENT: Normocephalic, atraumatic, pupils equal round reactive to light, neck supple, no masses, no lymphadenopathy, thyroid nonpalpable.  Skin: Warm and dry, no rashes. Cardiac: Regular rate and rhythm, no murmurs rubs or gallops, no lower extremity edema.  Respiratory: Clear to auscultation bilaterally. Not using accessory muscles, speaking in full sentences.  Impression and Recommendations:

## 2013-05-29 NOTE — Assessment & Plan Note (Signed)
Resolved after right-sided L5-S1 interlaminar epidural. Return as needed. I have recommended that she do magnesium at bedtime for occasional cramping.

## 2013-07-03 ENCOUNTER — Ambulatory Visit (INDEPENDENT_AMBULATORY_CARE_PROVIDER_SITE_OTHER): Payer: BC Managed Care – PPO | Admitting: Obstetrics & Gynecology

## 2013-07-03 ENCOUNTER — Encounter: Payer: Self-pay | Admitting: Obstetrics & Gynecology

## 2013-07-03 VITALS — BP 140/89 | HR 87 | Resp 16 | Ht 63.0 in | Wt 228.0 lb

## 2013-07-03 DIAGNOSIS — N946 Dysmenorrhea, unspecified: Secondary | ICD-10-CM

## 2013-07-03 DIAGNOSIS — D259 Leiomyoma of uterus, unspecified: Secondary | ICD-10-CM

## 2013-07-03 DIAGNOSIS — D219 Benign neoplasm of connective and other soft tissue, unspecified: Secondary | ICD-10-CM

## 2013-07-03 DIAGNOSIS — N920 Excessive and frequent menstruation with regular cycle: Secondary | ICD-10-CM

## 2013-07-03 MED ORDER — NORGESTREL-ETHINYL ESTRADIOL 0.3-30 MG-MCG PO TABS: 3.0000 | ORAL_TABLET | Freq: Every day | ORAL | Status: DC

## 2013-07-03 NOTE — Progress Notes (Signed)
   Subjective:    Patient ID: Elizabeth Copeland, female    DOB: 10-01-1970, 43 y.o.   MRN: [id number]  HPI  43yo SW P1 (69 yo daughter) here today to discuss her fibroid and create a plan of action. Her periods are heavier and more painful, worse in the recent past. U/S at [facility] showed a 6x6 cm intramural fibroid. Endometrium 9 mm. She took [place] but felt bad with them and is not taking them now. She was offered an endometrial ablation as well as Lupron. She would like to try OCPs.  Review of Systems Pap normal 7/14. Last mammogram 11/14.    Objective:   Physical Exam        Assessment & Plan:  Dysmenorrhea/menorrhagia with an intramural fibroid- trial of OCPs, genric lo ovral. BP check in 3 weeks.

## 2013-07-03 NOTE — Patient Instructions (Signed)
Fibroids Fibroids are lumps (tumors) that can occur any place in a woman's body. These lumps are not cancerous. Fibroids vary in size, weight, and where they grow. HOME CARE  Do not take aspirin.  Write down the number of pads or tampons you use during your period. Tell your doctor. This can help determine the best treatment for you. GET HELP RIGHT AWAY IF:  You have pain in your lower belly (abdomen) that is not helped with medicine.  You have cramps that are not helped with medicine.  You have more bleeding between or during your period.  You feel lightheaded or pass out (faint).  Your lower belly pain gets worse. MAKE SURE YOU:  Understand these instructions.  Will watch your condition.  Will get help right away if you are not doing well or get worse. Document Released: 04/23/2010 Document Revised: 06/13/2011 Document Reviewed: 04/23/2010 [employer] Patient Information 2014 [employer].  

## 2013-07-10 ENCOUNTER — Encounter: Payer: Self-pay | Admitting: Family Medicine

## 2013-07-10 ENCOUNTER — Ambulatory Visit (INDEPENDENT_AMBULATORY_CARE_PROVIDER_SITE_OTHER): Payer: BC Managed Care – PPO | Admitting: Family Medicine

## 2013-07-10 VITALS — BP 120/72 | HR 73 | Ht 63.0 in | Wt 228.0 lb

## 2013-07-10 DIAGNOSIS — M545 Low back pain, unspecified: Secondary | ICD-10-CM

## 2013-07-10 DIAGNOSIS — IMO0002 Reserved for concepts with insufficient information to code with codable children: Secondary | ICD-10-CM

## 2013-07-10 DIAGNOSIS — M5416 Radiculopathy, lumbar region: Secondary | ICD-10-CM

## 2013-07-10 DIAGNOSIS — R197 Diarrhea, unspecified: Secondary | ICD-10-CM

## 2013-07-10 MED ORDER — HYDROCODONE-ACETAMINOPHEN 7.5-325 MG PO TABS
1.0000 | ORAL_TABLET | Freq: Two times a day (BID) | ORAL | Status: DC | PRN
Start: 2013-07-10 — End: 2014-01-03

## 2013-07-10 NOTE — Addendum Note (Signed)
Addended by: Teddy Spike on: 07/10/2013 09:35 AM   Modules accepted: Orders

## 2013-07-10 NOTE — Progress Notes (Signed)
   Subjective:    Patient ID: Elizabeth Copeland, female    DOB: [birth year], 43 y.o.   MRN: [id number]  HPI F/U right lumbar radiculitis. She had epidural and did well overall. Back has been flaring since has been working on her house. Wants to hold off on another epidural till later this year.  Was moving boxes and furniture, etc and that inflammed her back. She is not doing any regular exercises, etc. Did do PT once.  Would like refill on her norco.  Carrying her dog also aggrevated her back too. Better with rest.     Review of Systems     Objective:   Physical Exam  Constitutional: She is oriented to person, place, and time. She appears well-developed and well-nourished.  HENT:  Head: Normocephalic and atraumatic.  Cardiovascular: Normal rate, regular rhythm and normal heart sounds.   Pulmonary/Chest: Effort normal and breath sounds normal.  Musculoskeletal:  Lumbar spine with normal flexion, extension, rotation, side bending. Nontender of the actual spine itself. Nontender over the lumbar paraspinous muscles bilaterally. No SI joint tenderness. Hip, knee, ankle strength is 5 out of 5 bilaterally.  Neurological: She is alert and oriented to person, place, and time.  Skin: Skin is warm and dry.  Psychiatric: She has a normal mood and affect. Her behavior is normal.          Assessment & Plan:  Low back pain, acute on chronic. Will refill hydrocodone today. She says she has a prescription of the meloxicam on hand. Recommend work on heat and gentle stretches. Also discussed long-term really needs to work on strengthening her course. She's not doing any type of regular exercise or stretching or strengthening program. He'll give her a handout today on something so she can certainly work on. If she continues to flare or improving then consider seeing Dr. Dianah Field back for possible epidural injection which was helpful for her.

## 2013-07-24 ENCOUNTER — Ambulatory Visit (INDEPENDENT_AMBULATORY_CARE_PROVIDER_SITE_OTHER): Payer: BC Managed Care – PPO | Admitting: *Deleted

## 2013-07-24 VITALS — BP 119/79 | HR 77 | Resp 16

## 2013-07-24 DIAGNOSIS — N920 Excessive and frequent menstruation with regular cycle: Secondary | ICD-10-CM

## 2013-07-24 DIAGNOSIS — Z3041 Encounter for surveillance of contraceptive pills: Secondary | ICD-10-CM

## 2013-07-24 NOTE — Progress Notes (Signed)
Pt here for BP check. Pt c/o last cycle causing a lot of pain and low grade fever. Pt started OCP 3 days ago and will see if next cycle causes the same Sx. BP WNL today. Pt states BP is as low as it has ever been.

## 2014-01-01 ENCOUNTER — Ambulatory Visit: Payer: BC Managed Care – PPO | Admitting: Family Medicine

## 2014-01-03 ENCOUNTER — Encounter: Payer: Self-pay | Admitting: Family Medicine

## 2014-01-03 ENCOUNTER — Ambulatory Visit (INDEPENDENT_AMBULATORY_CARE_PROVIDER_SITE_OTHER): Payer: BC Managed Care – PPO | Admitting: Family Medicine

## 2014-01-03 VITALS — BP 139/86 | HR 84 | Temp 98.4°F | Ht 63.0 in | Wt 222.0 lb

## 2014-01-03 DIAGNOSIS — M5417 Radiculopathy, lumbosacral region: Secondary | ICD-10-CM | POA: Diagnosis not present

## 2014-01-03 DIAGNOSIS — K2100 Gastro-esophageal reflux disease with esophagitis, without bleeding: Secondary | ICD-10-CM

## 2014-01-03 DIAGNOSIS — M25842 Other specified joint disorders, left hand: Secondary | ICD-10-CM

## 2014-01-03 DIAGNOSIS — M5416 Radiculopathy, lumbar region: Secondary | ICD-10-CM

## 2014-01-03 MED ORDER — HYDROCODONE-ACETAMINOPHEN 7.5-325 MG PO TABS: 1.0000 | ORAL_TABLET | Freq: Two times a day (BID) | ORAL | Status: DC | PRN

## 2014-01-03 MED ORDER — OMEPRAZOLE 40 MG PO CPDR: 40.0000 mg | DELAYED_RELEASE_CAPSULE | Freq: Every day | ORAL | Status: DC

## 2014-01-03 MED ORDER — GABAPENTIN 800 MG PO TABS: 800.0000 mg | ORAL_TABLET | Freq: Three times a day (TID) | ORAL | Status: DC

## 2014-01-03 NOTE — Progress Notes (Signed)
   Subjective:    Patient ID: Elizabeth Copeland, female    DOB: [birth year], 43 y.o.   MRN: [id number]  Back Pain   Followup right lumbar radiculitis- last seen in April.  She would like a refill on her gabapentin and her vicodin.  Has lost 5 lbs.    Still having some loose stool but not all the time.  She never did the stool sample testing.  She has been having a lot of heartburn. + nausea.  Using her rolaids every day.  Not on a PPI. She denies any abdominal pain today. She is having some cramping because she's on her.  Cyst on the palm on the left hand for years.  Started getting tender more lately.  Esp with gripping.  Review of Systems  Musculoskeletal: Positive for back pain.       Objective:   Physical Exam  Constitutional: She is oriented to person, place, and time. She appears well-developed and well-nourished.  HENT:  Head: Normocephalic and atraumatic.  Cardiovascular: Normal rate, regular rhythm and normal heart sounds.   Pulmonary/Chest: Effort normal and breath sounds normal.  Neurological: She is alert and oriented to person, place, and time.  She does have a palpable, mobile cyst on the palm of the left hand at the base of the 4th finger.   Skin: Skin is warm and dry.  Psychiatric: She has a normal mood and affect. Her behavior is normal.          Assessment & Plan:  Right lumbar radiculitis- Stable.  Will refill her meds. F/U in 2-3 months.  Need to have her complete narcotic contract at f/u visiti.   GERD - will start omeprazole 40 mg. Encouraged her take this for 4-6 weeks. If she's doing well at that point and she can decrease down to 20 mg and we can slowly wean her off the medication. We discussed reflux diet. Handout provided.  Cyst on palm of hand-has been there for several years only become painful more recently. Encouraged her to seek with Dr. Dianah Field, or sports medicine doctor about possibly treating this.

## 2014-01-03 NOTE — Patient Instructions (Signed)

## 2014-01-28 ENCOUNTER — Other Ambulatory Visit: Payer: Self-pay | Admitting: *Deleted

## 2014-01-28 DIAGNOSIS — N921 Excessive and frequent menstruation with irregular cycle: Secondary | ICD-10-CM

## 2014-01-28 MED ORDER — NORGESTREL-ETHINYL ESTRADIOL 0.3-30 MG-MCG PO TABS: 3.0000 | ORAL_TABLET | Freq: Every day | ORAL | Status: DC

## 2014-01-28 NOTE — Telephone Encounter (Signed)
RF authorization sent to [facility] for Low-Ogestrel per Dr Hulan Fray.

## 2014-02-03 ENCOUNTER — Encounter: Payer: Self-pay | Admitting: Family Medicine

## 2014-02-19 ENCOUNTER — Encounter: Payer: Self-pay | Admitting: Sports Medicine

## 2014-02-19 ENCOUNTER — Ambulatory Visit (INDEPENDENT_AMBULATORY_CARE_PROVIDER_SITE_OTHER): Payer: BC Managed Care – PPO | Admitting: Sports Medicine

## 2014-02-19 VITALS — BP 131/72 | HR 97 | Ht 63.0 in | Wt 222.0 lb

## 2014-02-19 DIAGNOSIS — M5417 Radiculopathy, lumbosacral region: Secondary | ICD-10-CM | POA: Diagnosis not present

## 2014-02-19 DIAGNOSIS — G5621 Lesion of ulnar nerve, right upper limb: Secondary | ICD-10-CM

## 2014-02-19 DIAGNOSIS — M5416 Radiculopathy, lumbar region: Secondary | ICD-10-CM

## 2014-02-19 NOTE — Assessment & Plan Note (Signed)
Unclear site of neurapraxia, suspect cubital tunnel syndrome. We will start with an elbow sleeve to discourage flexion at night. Over-the-counter NSAIDs. If still no response after one month we will proceed with nerve conduction study. If this does represent cubital tunnel syndrome I will do an ulnar nerve hydrodissection of the cubital tunnel, it seems to be radicular we can obtain an MRI of her neck for interventional injection planning.  Return in one month.

## 2014-02-19 NOTE — Assessment & Plan Note (Signed)
Ten-month response to right-sided L5-S1 interlaminar epidural. Repeat injection.

## 2014-02-19 NOTE — Progress Notes (Signed)
   Subjective:    I'm seeing this patient as a consultation for:  Dr. Madilyn Fireman  CC: right arm numbness  HPI: This is a very pleasant 43 year old female, she comes in with a several week history of pain, with occasional numbness and tingling along the medial aspect of her right forearm radiating into this third, fourth, and fifth fingers. It is moderate, persistent, worse in the morning. She denies any neck pain. No contralateral symptoms. No trauma.  Lumbar radiculopathy: We did do a right-sided lumbar epidural approximately 10-11 months ago, she does desire repeat.  Past medical history, Surgical history, Family history not pertinant except as noted below, Social history, Allergies, and medications have been entered into the medical record, reviewed, and no changes needed.   Review of Systems: No headache, visual changes, nausea, vomiting, diarrhea, constipation, dizziness, abdominal pain, skin rash, fevers, chills, night sweats, weight loss, swollen lymph nodes, body aches, joint swelling, muscle aches, chest pain, shortness of breath, mood changes, visual or auditory hallucinations.   Objective:   General: Well Developed, well nourished, and in no acute distress.  Neuro/Psych: Alert and oriented x3, extra-ocular muscles intact, able to move all 4 extremities, sensation grossly intact. Skin: Warm and dry, no rashes noted.  Respiratory: Not using accessory muscles, speaking in full sentences, trachea midline.  Cardiovascular: Pulses palpable, no extremity edema. Abdomen: Does not appear distended. Neck: Negative spurling's Full neck range of motion Grip strength and sensation normal in bilateral hands Strength good C4 to T1 distribution No sensory change to C4 to T1 Reflexes normal Right Elbow: Unremarkable to inspection. Range of motion full pronation, supination, flexion, extension. Strength is full to all of the above directions Stable to varus, valgus stress. Negative moving  valgus stress test. No discrete areas of tenderness to palpation. Ulnar nerve does not sublux. Positive cubital tunnel Tinel's.  Impression and Recommendations:   This case required medical decision making of moderate complexity.

## 2014-03-19 ENCOUNTER — Ambulatory Visit: Payer: BC Managed Care – PPO | Admitting: Sports Medicine

## 2014-04-30 ENCOUNTER — Other Ambulatory Visit: Payer: Self-pay | Admitting: *Deleted

## 2014-04-30 ENCOUNTER — Encounter: Payer: Self-pay | Admitting: Family Medicine

## 2014-04-30 ENCOUNTER — Ambulatory Visit (INDEPENDENT_AMBULATORY_CARE_PROVIDER_SITE_OTHER): Payer: BLUE CROSS/BLUE SHIELD | Admitting: Family Medicine

## 2014-04-30 VITALS — BP 123/77 | HR 84 | Ht 63.0 in | Wt 223.0 lb

## 2014-04-30 DIAGNOSIS — R0789 Other chest pain: Secondary | ICD-10-CM | POA: Diagnosis not present

## 2014-04-30 DIAGNOSIS — M546 Pain in thoracic spine: Secondary | ICD-10-CM | POA: Diagnosis not present

## 2014-04-30 DIAGNOSIS — J3489 Other specified disorders of nose and nasal sinuses: Secondary | ICD-10-CM | POA: Diagnosis not present

## 2014-04-30 DIAGNOSIS — J029 Acute pharyngitis, unspecified: Secondary | ICD-10-CM

## 2014-04-30 DIAGNOSIS — M549 Dorsalgia, unspecified: Secondary | ICD-10-CM

## 2014-04-30 LAB — POCT RAPID STREP A (OFFICE): Rapid Strep A Screen: NEGATIVE

## 2014-04-30 MED ORDER — HYDROCODONE-ACETAMINOPHEN 7.5-325 MG PO TABS: 1.0000 | ORAL_TABLET | Freq: Two times a day (BID) | ORAL | Status: DC | PRN

## 2014-04-30 NOTE — Progress Notes (Signed)
   Subjective:    Patient ID: Elizabeth Copeland, female    DOB: February 25, 1971, 44 y.o.   MRN: [id number]  HPI ON Sunday was shoveling snow started having anterior and posterior chest pain when took a deep breath.  No SOB or wheezing.  Today she feels some better.  No fever.  Has had a persistant ST.  Says throat was worse yesterday.   Has been on her PPI until recently . Ran out 4 days ago.  Having a lot of post nasal drip and drainage. I think most of her sore throat symptoms are coming from her postnasal drainage. She's also had some nasal itching. Also complains of very dry nose. She feels like it has some cracks and has been bleeding..  Review of Systems     Objective:   Physical Exam  Constitutional: She is oriented to person, place, and time. She appears well-developed and well-nourished.  HENT:  Head: Normocephalic and atraumatic.  Right Ear: External ear normal.  Left Ear: External ear normal.  Nose: Nose normal.  Mouth/Throat: Oropharynx is clear and moist.  TMs and canals are clear.   Eyes: Conjunctivae and EOM are normal. Pupils are equal, round, and reactive to light.  Neck: Neck supple. No thyromegaly present.  Cardiovascular: Normal rate, regular rhythm and normal heart sounds.   Pulmonary/Chest: Effort normal and breath sounds normal. She has no wheezes.  Lymphadenopathy:    She has no cervical adenopathy.  Neurological: She is alert and oriented to person, place, and time.  Skin: Skin is warm and dry.  Psychiatric: She has a normal mood and affect.          Assessment & Plan:  Chest pain/upper back pain-most like secondary to muscle skeletal strain from shoveling snow. She's actually feeling much better today. She still a little bit tender on the anterior chest wall with pressure. Otherwise lungs sounds great.  Peak flows in the green zone.  Heart sounds normal today. It does not sound cardiac in nature.  Pharyngitis - neg for strep. Recommended trial of an  antihistamine such as Claritin or Zyrtec. These can be very drying to the nasal mucosa is similar need to stop it if it bothers her.  Dryness-recommend running a humidifier at that time in her bedroom and using nasal gel moisturizers like Ayr.

## 2014-04-30 NOTE — Patient Instructions (Addendum)
Can try Zyrtec  Or Claritin Can try the nasal gel moisturizer like Ayr. Call if not better.

## 2014-09-24 ENCOUNTER — Ambulatory Visit (INDEPENDENT_AMBULATORY_CARE_PROVIDER_SITE_OTHER): Payer: BLUE CROSS/BLUE SHIELD | Admitting: Sports Medicine

## 2014-09-24 ENCOUNTER — Encounter: Payer: Self-pay | Admitting: Sports Medicine

## 2014-09-24 DIAGNOSIS — M5417 Radiculopathy, lumbosacral region: Secondary | ICD-10-CM

## 2014-09-24 DIAGNOSIS — M5416 Radiculopathy, lumbar region: Secondary | ICD-10-CM

## 2014-09-24 MED ORDER — MAGNESIUM OXIDE 400 MG PO TABS: 800.0000 mg | ORAL_TABLET | Freq: Every day | ORAL | Status: DC

## 2014-09-24 MED ORDER — GABAPENTIN 800 MG PO TABS: 800.0000 mg | ORAL_TABLET | Freq: Three times a day (TID) | ORAL | Status: DC

## 2014-09-24 MED ORDER — HYDROCODONE-ACETAMINOPHEN 7.5-325 MG PO TABS: 1.0000 | ORAL_TABLET | Freq: Two times a day (BID) | ORAL | Status: DC | PRN

## 2014-09-24 NOTE — Progress Notes (Signed)
  Subjective:    CC: Follow-up  HPI: Right lumbar radiculopathy: Previous epidural was approximately a year and a half ago, did well, now having recurrence of axial back pain with right-sided radicular symptoms, she needs to take gabapentin 800 mg 3 times per day. Symptoms are moderate, recurrent.  Past medical history, Surgical history, Family history not pertinant except as noted below, Social history, Allergies, and medications have been entered into the medical record, reviewed, and no changes needed.   Review of Systems: No fevers, chills, night sweats, weight loss, chest pain, or shortness of breath.   Objective:    General: Well Developed, well nourished, and in no acute distress.  Neuro: Alert and oriented x3, extra-ocular muscles intact, sensation grossly intact.  HEENT: Normocephalic, atraumatic, pupils equal round reactive to light, neck supple, no masses, no lymphadenopathy, thyroid nonpalpable.  Skin: Warm and dry, no rashes. Cardiac: Regular rate and rhythm, no murmurs rubs or gallops, no lower extremity edema.  Respiratory: Clear to auscultation bilaterally. Not using accessory muscles, speaking in full sentences. Back Exam:  Inspection: Unremarkable  Motion: Flexion 45 deg, Extension 45 deg, Side Bending to 45 deg bilaterally,  Rotation to 45 deg bilaterally  SLR laying: Negative  XSLR laying: Negative  Palpable tenderness: None. FABER: negative. Sensory change: Gross sensation intact to all lumbar and sacral dermatomes.  Reflexes: 2+ at both patellar tendons, 2+ at achilles tendons, Babinski's downgoing.  Strength at foot  Plantar-flexion: 5/5 Dorsi-flexion: 5/5 Eversion: 5/5 Inversion: 5/5  Leg strength  Quad: 5/5 Hamstring: 5/5 Hip flexor: 5/5 Hip abductors: 5/5  Gait unremarkable.  Impression and Recommendations:

## 2014-09-24 NOTE — Assessment & Plan Note (Signed)
Starting to have recurrence of pain, we are going to repeat a right-sided L5-S1 interlaminar epidural.  Refilling gabapentin. Also adding magnesium oxide for use at bedtime or nocturnal cramps. Return to see me in one month to evaluate response. We can do up to 3 epidurals in a six-month period.

## 2014-10-15 ENCOUNTER — Encounter: Payer: Self-pay | Admitting: Sports Medicine

## 2014-10-15 ENCOUNTER — Ambulatory Visit
Admission: RE | Admit: 2014-10-15 | Discharge: 2014-10-15 | Disposition: A | Discharge status: Home / Self Care | Payer: BLUE CROSS/BLUE SHIELD | Source: Ambulatory Visit | Attending: Sports Medicine | Admitting: Sports Medicine

## 2014-10-15 ENCOUNTER — Ambulatory Visit (INDEPENDENT_AMBULATORY_CARE_PROVIDER_SITE_OTHER): Payer: BLUE CROSS/BLUE SHIELD | Admitting: Sports Medicine

## 2014-10-15 DIAGNOSIS — M5416 Radiculopathy, lumbar region: Secondary | ICD-10-CM

## 2014-10-15 DIAGNOSIS — M5417 Radiculopathy, lumbosacral region: Secondary | ICD-10-CM | POA: Diagnosis not present

## 2014-10-15 MED ORDER — METHYLPREDNISOLONE ACETATE 40 MG/ML INJ SUSP (RADIOLOG
120.0000 mg | Freq: Once | INTRAMUSCULAR | Status: AC
  Administered 2014-10-15: 120 mg via EPIDURAL

## 2014-10-15 MED ORDER — TRAMADOL HCL 50 MG PO TABS: ORAL_TABLET | ORAL | Status: DC

## 2014-10-15 MED ORDER — IOHEXOL 180 MG/ML  SOLN
1.0000 mL | Freq: Once | INTRAMUSCULAR | Status: AC | PRN
  Administered 2014-10-15: 1 mL via EPIDURAL

## 2014-10-15 NOTE — Discharge Instructions (Signed)

## 2014-10-15 NOTE — Progress Notes (Signed)
  Subjective:    CC:  Low back pain  HPI: Lumbar radiculopathy: right-sided, L5-S1 level, was overall doing well after an epidural in February 2015, spent a lot of time a car and now has significant back pain in the same distribution and desires a repeat epidural,  painis moderate, persistent with radiation down the right leg in an L5 distribution. She also has significant axial pain. She does desire a repeat epidural if possible.  Past medical history, Surgical history, Family history not pertinant except as noted below, Social history, Allergies, and medications have been entered into the medical record, reviewed, and no changes needed.   Review of Systems: No fevers, chills, night sweats, weight loss, chest pain, or shortness of breath.   Objective:    General: Well Developed, well nourished, and in no acute distress.  Neuro: Alert and oriented x3, extra-ocular muscles intact, sensation grossly intact.  HEENT: Normocephalic, atraumatic, pupils equal round reactive to light, neck supple, no masses, no lymphadenopathy, thyroid nonpalpable.  Skin: Warm and dry, no rashes. Cardiac: Regular rate and rhythm, no murmurs rubs or gallops, no lower extremity edema.  Respiratory: Clear to auscultation bilaterally. Not using accessory muscles, speaking in full sentences. Back Exam:  Inspection: Unremarkable  Motion: Flexion 45 deg, Extension 45 deg, Side Bending to 45 deg bilaterally,  Rotation to 45 deg bilaterally  SLR laying: Negative  XSLR laying: Negative  Palpable tenderness: None. FABER: negative. Sensory change: Gross sensation intact to all lumbar and sacral dermatomes.  Reflexes: 2+ at both patellar tendons, 2+ at achilles tendons, Babinski's downgoing.  Strength at foot  Plantar-flexion: 5/5 Dorsi-flexion: 5/5 Eversion: 5/5 Inversion: 5/5  Leg strength  Quad: 5/5 Hamstring: 5/5 Hip flexor: 5/5 Hip abductors: 5/5  Gait unremarkable.  Impression and Recommendations:    I spent 25  minutes with this patient, greater than 50% was face-to-face time counseling regarding the above diagnoses

## 2014-10-15 NOTE — Assessment & Plan Note (Addendum)
Recurrent axial and radicular pain. Last epidural was 05/2013. Tramadol, repeat epidural.

## 2015-02-03 ENCOUNTER — Telehealth: Payer: Self-pay

## 2015-02-03 NOTE — Telephone Encounter (Signed)
Error

## 2015-02-04 ENCOUNTER — Telehealth: Payer: Self-pay

## 2015-02-04 DIAGNOSIS — M5416 Radiculopathy, lumbar region: Secondary | ICD-10-CM

## 2015-02-04 MED ORDER — GABAPENTIN 800 MG PO TABS: ORAL_TABLET | ORAL | Status: DC

## 2015-02-04 NOTE — Telephone Encounter (Signed)
PATIENT REQUEST REFILL FOR GABAPENTIN 800 MG   PATIENT ADVISED THAT A FOLLOW UP APPOINTMENT IS NEEDED FOR FURTHER REFILLS. Brenden Rudman,CMA

## 2015-02-05 MED ORDER — GABAPENTIN 800 MG PO TABS: ORAL_TABLET | ORAL | Status: DC

## 2015-02-05 NOTE — Telephone Encounter (Signed)
I think gabapentin is okay.I will call some in.

## 2015-04-09 ENCOUNTER — Encounter: Payer: Self-pay | Admitting: Osteopathic Medicine

## 2015-04-09 ENCOUNTER — Ambulatory Visit (INDEPENDENT_AMBULATORY_CARE_PROVIDER_SITE_OTHER): Payer: BLUE CROSS/BLUE SHIELD | Admitting: Osteopathic Medicine

## 2015-04-09 VITALS — BP 138/80 | HR 97 | Temp 98.2°F | Wt 222.0 lb

## 2015-04-09 DIAGNOSIS — B9789 Other viral agents as the cause of diseases classified elsewhere: Principal | ICD-10-CM

## 2015-04-09 DIAGNOSIS — J069 Acute upper respiratory infection, unspecified: Secondary | ICD-10-CM

## 2015-04-09 MED ORDER — HYDROCOD POLST-CPM POLST ER 10-8 MG/5ML PO SUER: 5.0000 mL | Freq: Two times a day (BID) | ORAL | Status: DC | PRN

## 2015-04-09 MED ORDER — MOMETASONE FUROATE 50 MCG/ACT NA SUSP: NASAL | Status: DC

## 2015-04-09 MED ORDER — METHYLPREDNISOLONE 4 MG PO TBPK: ORAL_TABLET | ORAL | Status: DC

## 2015-04-09 NOTE — Patient Instructions (Signed)
DR. Arianis Bowditch'S HOME CARE INSTRUCTIONS: UPPER RESPIRATORY ILLNESS AND SINUSITIS   FRIST, A FEW NOTES ON OVER-THE-COUNTER MEDICATIONS!  . USE CAUTION - MANY OVER-THE-COUNTER MEDICATIONS COME IN COMBINATIONS OF MULTIPLE GENERICS. FOR INSTANCE, NYQUIL HAS TYLENOL + COUGH MEDICINE + AN ANTIHISTAMINE, SO BE CAREFUL YOU'RE NOT TAKING A COMBINATION MEDICINE WHICH CONTAINS MEDICATIONS YOU'RE ALSO TAKING SEPARATELY (LIKE NYQUIL SYRUP AS WELL AS TYLENOL PILLS).  . YOUR PHARMACIST CAN HELP YOU AVOID MEDICATION INTERACTIONS AND DUPLICATIONS - ASK FOR THEIR HELP IF YOU ARE CONFUSED OR UNSURE ABOUT WHAT TO PURCHASE OVER-THE-COUNTER!  . REMEMBER - IF YOU'RE EVER CONCERNED ABOUT MEDICATION SIDE EFFECTS, OR IF YOU'RE EVER CONCERNED YOUR SYMPTOMS ARE GETTING WORSE DESPITE TREATMENT, PLEASE CALL THE OFFICE!   TREAT SINUS CONGESTION, RUNNY NOSE & POSTNASAL DRIP: . Treat to increase airflow through sinuses, decrease congestion pain and prevent bacterial growth!  . Remember, only 0.5-2% of sinus infections are due to a bacteria, the rest are due to a virus (usually the common cold)! Trust your doctor when he or she decides whether or not you really need an antibiotic!   NASAL SPRAYS: generally safe and should not interact with other medicines. Can take any of these medications, either alone or together... FLONASE (FLUTICASONE) - 2 sprays in each nostril twice per day (also a great allergy medicine to use long-term!) AFRIN (OXYMETOLAZONE) - use sparingly because it will cause rebound congestion, NEVER USE IN KIDS   SALINE NASAL SPRAY- no limit, but avoid use after other nasal sprays or it can wash the medicine away  ANTIHISTAMINES: Helps dry out runny nose and decreases postnasal drip. Benadryl may cause drowsiness but other preparations should not be as sedating. Certain kinds are not as safe in elderly individuals. OK to use unless Dr A says otherwise.  Only use one of the following... BENADRYL (DIPHENHYDRAMINE) -  25-50 mg every 6 hours ZYRTEC (CETIRIZINE) - 5-10 mg daily CLARITIN (LORATIDINE) - less potent. 10 mg daily ALLEGRA (FEXOFENADINE) - least likely to cause drowsiness! 180 mg daily or 60 mg twice per day  DECONGESTANTS: Helps dry out runny nose and helps with sinus pain. May cause insomnia, or sometimes elevated heart rate. Can cause problems if used often in people with high blood pressure. OK to use unless Dr A says otherwise. NEVER USE IN KIDS UNDER 2 YEARS OLD. Only use one of the following... SUDAFED (PSEUDOEPHEDINE) - 60 mg every 4 - 6 hours, also comes in 120 mg extended release every 12 hours, maximum 240 mg in 24 hours.  SUDAED PE (PHENYLEPHRINE) - 10 mg every 4 - 6 hours, maximum 60 mg per day  COMBINATIONS OF ANTIHISTAMINE + DECONGESTANT: these usually require you to show your ID at the pharmacy counter. You can also purchase these medicines separately as noted above.  Only use one of the following... ZYRTEC-D (CETIRIZINE + PSEUDOEPHEDRINE) - 12 hour formulation as directed CLARITIN-D (LORATIDINE + PSEUDOEPHEDRINE) - 12 and 24 hour formulations as directed ALLEGRA-D (FEXOFENADINE + PSEUDOEPHEDRINE) - 12 and 24 hour formulations as directed  PRESCRIPTION TREATMENT FOR SINUS PROBLEMS: Can include nasal sprays, pills, or antibiotics in the case of true bacterial infection. Not everyone needs an antibiotic but there are other medicines which will help you feel better while your body fights the infection!   TREAT COUGH & SORE THROAT: . Remember, cough is the body's way of protecting your airways and lungs, it's a hard-wired reflex that is tough for medicines to treat!  . Irritation to the airways will cause   cough. This irritation is usually caused by upper airway problems like postnasal drip (treat as above) and viral sore throat, but in severe cases can be due to lower airway problems like bronchitis or pneumonia, which a doctor can usually hear on exam of your lungs or an X-ray. . Sore  throat is almost always due to a virus, but occasionally caused by Strep, which requires antibiotics.  . Exercise and smoking may make cough worse - take it easy while you're sick, and QUIT SMOKING!   . Cough due to viral infection can linger for 2 weeks or so. If you're coughing longer than you think you should, or if the cough is severe, please make an appointment in the office - you may need a chest X-ray.   EXPECTORANT: Used to help clear airways, take these with PLENTY of water to help thin mucus secretions and make the mucus easier to cough up   Only use one of the following... ROBITUSSIN (DEXTROMETHORPHAN OR GUAIFENISEN depending on formulation)  MUCINEX (GUAIFENICEN) - usually longer acting  COUGH DROPS/LOZENGES: Whichever over-the-counter agent you prefer!  Here are some suggestions for ingredients to look for (can take both)... BENZOCAINE - numbing effect, also in [place] - cooling effect  HONEY: has gone head-to-head in several clinical trials with prescription cough medicines and found to be equally effective! Try 1 Teaspoon Honey + 2 Drops Lemon Juice, as much as you want to use. NONE FOR KIDS UNDER AGE 8  HERBAL TEA: There are certain ingredients which help "coat the throat" to relieve pain  such as ELM BARK, LICORICE ROOT, MARSHMALLOW ROOT  PRESCRIPTION TREATMENT FOR COUGH: Reserved for severe cases. Can include pills, syrups or inhalers.    TREAT ACHES & PAINS, FEVER: . Illness causes aches, pains and fever as your body increases its natural inflammation response to help fight the infection.  . Rest, good hydration and nutrition, and taking anti-inflammatory medications will help.  . Remember: a true fever is a temperature 100.4 or higher. If you have a fever that is 105.0 or higher, that is a dangerous level and needs medical attention in the office or in the ER!    Can take both of these together... IBUPROFEN - 400-800 mg every 6 - 8 hours.  Ibuprofen and similar medications can cause problems for people with heart disease or history of stomach ulcers, check with Dr A first if you're concerned. Lower doses are usually safe and effective.  TYLENOL (ACETAMINOPHEN) - [phone] mg every 6 hours. It won't cause problems with heart or stomach, but if you have liver problems ask your doctor first.     REMEMBER - THE MOST IMPORTANT THINGS YOU CAN DO TO AVOID CATCHING OR SPREADING ILLNESS INCLUDE:  . COVER YOUR COUGH WITH YOUR ARM, NOT WITH YOUR HANDS!  . DISINFECT COMMONLY USED SURFACES (SUCH AS TELEPHONES & DOORKNOBS) WHEN YOU OR SOMEONE CLOSE TO YOU IS FEELING SICK!  . BE SURE VACCINES ARE UP TO DATE - GET A FLU SHOT EVERY YEAR! . GOOD NUTRITION AND HEALTHY LIFESTYLE WILL HELP YOUR IMMUNE SYSTEM YEAR-ROUND! . AND ABOVE ALL - [place]!

## 2015-04-09 NOTE — Progress Notes (Signed)
HPI: Elizabeth Copeland is a 45 y.o. female who presents to [place]  today for chief complaint of:  Chief Complaint  Patient presents with  . Cough   . Location: sinuses, cough . Quality/duration: pressure/congestion in sinuses about a week, cough staring a few days ago . Context: yes sick contacts, no recent travel . Modifying factors: has tried the following OTC medications: tylenol, Advil  without relief . Assoc signs/symptoms: yes fever/chills highest 99.9 at home, yes in the morning productive cough, Yes  body aches, No  GI upset   Past medical, social and family history reviewed: Past Medical History  Diagnosis Date  . Back pain   . Morbid obesity   . Fibroid    Past Surgical History  Procedure Laterality Date  . Cesarean section  2000   Social History  Substance Use Topics  . Smoking status: Former Smoker    Types: Cigarettes    Quit date: 04/20/2010  . Smokeless tobacco: Not on file  . Alcohol Use: Yes     Comment: social   Family History  Problem Relation Age of Onset  . Hypertension    . Diabetes Father     Current Outpatient Prescriptions  Medication Sig Dispense Refill  . gabapentin (NEURONTIN) 800 MG tablet 1 tab in the morning, 1 tab midday, and 2 tabs at bedtime. 180 tablet 0  . HYDROcodone-acetaminophen (NORCO) 7.5-325 MG per tablet Take 1 tablet by mouth 2 (two) times daily as needed for moderate pain or severe pain. 30 tablet 0  . magnesium oxide (MAG-OX) 400 MG tablet Take 2 tablets (800 mg total) by mouth at bedtime. 90 tablet 3  . norgestrel-ethinyl estradiol (LO/OVRAL,CRYSELLE) 0.3-30 MG-MCG tablet Take 3 tablets by mouth daily. 1 Package 6  . omeprazole (PRILOSEC) 40 MG capsule Take 1 capsule (40 mg total) by mouth daily. 30 capsule 2  . traMADol (ULTRAM) 50 MG tablet 1-2 tabs by mouth Q8 hours, maximum 6 tabs per day. 40 tablet 0   No current facility-administered medications for this visit.   No Known  Allergies    Review of Systems: CONSTITUTIONAL: no fever/chills (temp high but no fever) HEAD/EYES/EARS/NOSE/THROAT: no headache, no vision change or hearing change, yes sore throat CARDIAC: No chest pain/pressure/palpitations, no orthopnea RESPIRATORY: yes cough, no shortness of breath GASTROINTESTINAL: no nausea, no vomiting, no abdominal pain/blood in stool/diarrhea/constipation MUSCULOSKELETAL: yes myalgia/arthralgia   Exam:  BP 138/80 mmHg  Pulse 97  Temp(Src) 98.2 F (36.8 C) (Oral)  Wt 222 lb (100.699 kg)  SpO2 100% Constitutional: VSS, see above. General Appearance: alert, well-developed, well-nourished, NAD Eyes: Normal lids and conjunctive, non-icteric sclera, PERRLA Ears, Nose, Mouth, Throat: Normal external inspection ears/nares/mouth/lips/gums, normal TM, MMM; posterior pharynx without erythema, without exudate Neck: No masses, trachea midline. No thyroid enlargement/tenderness/mass appreciated, normal lymph nodes Respiratory: Normal respiratory effort. No  wheeze/rhonchi/rales Cardiovascular: S1/S2 normal, no murmur/rub/gallop auscultated. RRR. No carotid bruit or JVD. No lower extremity edema.    ASSESSMENT/PLAN: OTC meds printed for patient: highlighted printed sheet with what she could take (antihistamines +/- decongestant, expectorant, Ibuprofen/Tylenol ok since BP recheck better, honey and lozenges and herbal tea no limit). Call Monday if still congested and will consider abx at that time. Pt states not taking Norco, advised do not double up on opiate meds (cough meds plus pain meds)   Viral URI with cough - Plan: methylPREDNISolone (MEDROL DOSEPAK) 4 MG TBPK tablet, mometasone (NASONEX) 50 MCG/ACT nasal spray, chlorpheniramine-HYDROcodone (TUSSIONEX) 10-8 MG/5ML SUER  Return if symptoms worsen or fail to improve.

## 2015-05-06 ENCOUNTER — Ambulatory Visit (INDEPENDENT_AMBULATORY_CARE_PROVIDER_SITE_OTHER): Payer: BLUE CROSS/BLUE SHIELD | Admitting: Sports Medicine

## 2015-05-06 ENCOUNTER — Encounter: Payer: Self-pay | Admitting: Sports Medicine

## 2015-05-06 VITALS — BP 141/86 | HR 97 | Resp 18 | Wt 224.0 lb

## 2015-05-06 DIAGNOSIS — I10 Essential (primary) hypertension: Secondary | ICD-10-CM

## 2015-05-06 DIAGNOSIS — D509 Iron deficiency anemia, unspecified: Secondary | ICD-10-CM

## 2015-05-06 DIAGNOSIS — M5417 Radiculopathy, lumbosacral region: Secondary | ICD-10-CM

## 2015-05-06 DIAGNOSIS — M5416 Radiculopathy, lumbar region: Secondary | ICD-10-CM

## 2015-05-06 LAB — COMPREHENSIVE METABOLIC PANEL
ALT: 12 U/L (ref 6–29)
BUN: 9 mg/dL (ref 7–25)
CO2: 23 mmol/L (ref 20–31)
Calcium: 8.9 mg/dL (ref 8.6–10.2)
Chloride: 104 mmol/L (ref 98–110)
Glucose, Bld: 82 mg/dL (ref 65–99)
Potassium: 4.4 mmol/L (ref 3.5–5.3)

## 2015-05-06 LAB — CBC
HCT: 25.2 % — ABNORMAL LOW (ref 36.0–46.0)
Hemoglobin: 6.6 g/dL — CL (ref 12.0–15.0)
MCH: 15 pg — ABNORMAL LOW (ref 26.0–34.0)
MCHC: 26.2 g/dL — ABNORMAL LOW (ref 30.0–36.0)
MCV: 57.3 fL — ABNORMAL LOW (ref 78.0–100.0)
Platelets: 474 10*3/uL — ABNORMAL HIGH (ref 150–400)
RBC: 4.4 MIL/uL (ref 3.87–5.11)
RDW: 19.3 % — ABNORMAL HIGH (ref 11.5–15.5)
WBC: 9.7 K/uL (ref 4.0–10.5)

## 2015-05-06 LAB — COMPREHENSIVE METABOLIC PANEL WITH GFR
AST: 19 U/L (ref 10–35)
Albumin: 4.1 g/dL (ref 3.6–5.1)
Alkaline Phosphatase: 53 U/L (ref 33–115)
Creat: 0.77 mg/dL (ref 0.50–1.10)
Sodium: 139 mmol/L (ref 135–146)
Total Bilirubin: 0.5 mg/dL (ref 0.2–1.2)
Total Protein: 6.8 g/dL (ref 6.1–8.1)

## 2015-05-06 LAB — LIPID PANEL
Cholesterol: 191 mg/dL (ref 125–200)
HDL: 38 mg/dL — ABNORMAL LOW (ref >=46)
LDL Cholesterol: 127 mg/dL (ref <130)
Total CHOL/HDL Ratio: 5 ratio (ref <=5.0)
Triglycerides: 130 mg/dL (ref <150)
VLDL: 26 mg/dL (ref <30)

## 2015-05-06 LAB — TSH: TSH: 1.654 u[IU]/mL (ref 0.350–4.500)

## 2015-05-06 MED ORDER — GABAPENTIN 800 MG PO TABS: ORAL_TABLET | ORAL | Status: DC

## 2015-05-06 NOTE — Assessment & Plan Note (Signed)
Checking routine blood work, she does have an establish care visit with Dr. Madilyn Fireman coming up. She can follow up the blood work with Dr. Madilyn Fireman.

## 2015-05-06 NOTE — Progress Notes (Signed)
  Subjective:    CC: follow-up  HPI: Lumbar radiculitis: Did extremely well with gabapentin but unfortunately ran out. Pain is not bad enough to consider a repeat epidural.  Preventive measures: Has a establish care visit coming up with Dr. Madilyn Fireman, I'm happy to order some routine blood work that they can follow up together.  Past medical history, Surgical history, Family history not pertinant except as noted below, Social history, Allergies, and medications have been entered into the medical record, reviewed, and no changes needed.   Review of Systems: No fevers, chills, night sweats, weight loss, chest pain, or shortness of breath.   Objective:    General: Well Developed, well nourished, and in no acute distress.  Neuro: Alert and oriented x3, extra-ocular muscles intact, sensation grossly intact.  HEENT: Normocephalic, atraumatic, pupils equal round reactive to light, neck supple, no masses, no lymphadenopathy, thyroid nonpalpable.  Skin: Warm and dry, no rashes. Cardiac: Regular rate and rhythm, no murmurs rubs or gallops, no lower extremity edema.  Respiratory: Clear to auscultation bilaterally. Not using accessory muscles, speaking in full sentences.  Impression and Recommendations:    I spent 25 minutes with this patient, greater than 50% was face-to-face time counseling regarding the above diagnoses

## 2015-05-06 NOTE — Assessment & Plan Note (Signed)
Refilling gabapentin. Previous epidural 7 months ago, does not desire a repeat at this point.

## 2015-05-07 DIAGNOSIS — D509 Iron deficiency anemia, unspecified: Secondary | ICD-10-CM | POA: Insufficient documentation

## 2015-05-07 LAB — IRON AND TIBC
%SAT: 1 % — ABNORMAL LOW (ref 11–50)
Iron: <10 ug/dL — ABNORMAL LOW (ref 40–190)
TIBC: 504 ug/dL — ABNORMAL HIGH (ref 250–450)
UIBC: 499 ug/dL — ABNORMAL HIGH (ref 125–400)

## 2015-05-07 LAB — FOLATE: Folate: 10.7 ng/mL

## 2015-05-07 LAB — RETICULOCYTES
ABS Retic: 95.7 K/uL (ref 19.0–186.0)
RBC.: 4.35 MIL/uL (ref 3.87–5.11)
Retic Ct Pct: 2.2 % (ref 0.4–2.3)

## 2015-05-07 LAB — FERRITIN: Ferritin: 2 ng/mL — ABNORMAL LOW (ref 10–291)

## 2015-05-07 LAB — VITAMIN B12: Vitamin B-12: 476 pg/mL (ref 211–911)

## 2015-05-07 LAB — VITAMIN D 25 HYDROXY (VIT D DEFICIENCY, FRACTURES): Vit D, 25-Hydroxy: 14 ng/mL — ABNORMAL LOW (ref 30–100)

## 2015-05-07 LAB — HEMOGLOBIN A1C
Hgb A1c MFr Bld: 5.3 % (ref <5.7)
Mean Plasma Glucose: 105 mg/dL (ref <117)

## 2015-05-07 MED ORDER — VITAMIN D (ERGOCALCIFEROL) 1.25 MG (50000 UNIT) PO CAPS: 50000.0000 [IU] | ORAL_CAPSULE | ORAL | Status: DC

## 2015-05-07 MED ORDER — FUSION PLUS PO CAPS: 1.0000 | ORAL_CAPSULE | Freq: Every day | ORAL | Status: DC

## 2015-05-07 NOTE — Assessment & Plan Note (Signed)
Likely iron deficiency, an anemia panel, ferritin, reticulocytes, adding aggressive iron supplementation. Next line recheck in one month.

## 2015-05-07 NOTE — Addendum Note (Signed)
Addended by: Silverio Decamp on: 05/07/2015 08:33 AM   Modules accepted: Orders

## 2015-05-11 ENCOUNTER — Encounter: Payer: Self-pay | Admitting: Sports Medicine

## 2015-05-11 DIAGNOSIS — D509 Iron deficiency anemia, unspecified: Secondary | ICD-10-CM

## 2015-05-11 MED ORDER — FUSION PLUS PO CAPS: 1.0000 | ORAL_CAPSULE | Freq: Every day | ORAL | Status: DC

## 2015-06-03 ENCOUNTER — Encounter: Payer: Self-pay | Admitting: Family Medicine

## 2015-06-03 ENCOUNTER — Ambulatory Visit (INDEPENDENT_AMBULATORY_CARE_PROVIDER_SITE_OTHER): Payer: BLUE CROSS/BLUE SHIELD | Admitting: Family Medicine

## 2015-06-03 ENCOUNTER — Encounter: Payer: Self-pay | Admitting: Obstetrics & Gynecology

## 2015-06-03 ENCOUNTER — Ambulatory Visit (INDEPENDENT_AMBULATORY_CARE_PROVIDER_SITE_OTHER): Payer: BLUE CROSS/BLUE SHIELD | Admitting: Obstetrics & Gynecology

## 2015-06-03 VITALS — BP 133/61 | HR 83 | Resp 16 | Ht 62.0 in | Wt 221.0 lb

## 2015-06-03 VITALS — BP 133/61 | HR 83 | Wt 221.0 lb

## 2015-06-03 DIAGNOSIS — D509 Iron deficiency anemia, unspecified: Secondary | ICD-10-CM | POA: Insufficient documentation

## 2015-06-03 DIAGNOSIS — D239 Other benign neoplasm of skin, unspecified: Secondary | ICD-10-CM

## 2015-06-03 DIAGNOSIS — N63 Unspecified lump in breast: Secondary | ICD-10-CM

## 2015-06-03 DIAGNOSIS — Z0000 Encounter for general adult medical examination without abnormal findings: Secondary | ICD-10-CM

## 2015-06-03 DIAGNOSIS — N6310 Unspecified lump in the right breast, unspecified quadrant: Secondary | ICD-10-CM

## 2015-06-03 DIAGNOSIS — Z0189 Encounter for other specified special examinations: Secondary | ICD-10-CM | POA: Diagnosis not present

## 2015-06-03 DIAGNOSIS — E559 Vitamin D deficiency, unspecified: Secondary | ICD-10-CM | POA: Insufficient documentation

## 2015-06-03 DIAGNOSIS — Z01419 Encounter for gynecological examination (general) (routine) without abnormal findings: Secondary | ICD-10-CM

## 2015-06-03 DIAGNOSIS — D251 Intramural leiomyoma of uterus: Secondary | ICD-10-CM | POA: Insufficient documentation

## 2015-06-03 MED ORDER — MEGESTROL ACETATE 40 MG PO TABS: 80.0000 mg | ORAL_TABLET | Freq: Two times a day (BID) | ORAL | Status: DC

## 2015-06-03 NOTE — Addendum Note (Signed)
Addended by: Asencion Islam on: 06/03/2015 11:02 AM   Modules accepted: Orders

## 2015-06-03 NOTE — Progress Notes (Signed)
  Subjective:     Elizabeth Copeland is a 45 y.o. female and is here for a comprehensive physical exam. The patient reports no problems.  Social History   Social History  . Marital Status: Single    Spouse Name: N/A  . Number of Children: 1  . Years of Education: 2 yr coll   Occupational History  . member [employer]   Social History Main Topics  . Smoking status: Former Smoker    Types: Cigarettes    Quit date: 04/20/2010  . Smokeless tobacco: Not on file  . Alcohol Use: Yes     Comment: social  . Drug Use: No  . Sexual Activity: Not Currently   Other Topics Concern  . Not on file   Social History Narrative   No regular exercise.  Lives with her mother [name] and father Shakeita Kozal   Health Maintenance  Topic Date Due  . HIV Screening  03/20/1984  . INFLUENZA VACCINE  06/02/2016 (Originally 11/03/2014)  . PAP SMEAR  10/21/2015  . TETANUS/TDAP  02/21/2023    The following portions of the patient's history were reviewed and updated as appropriate: allergies, current medications, past family history, past medical history, past social history, past surgical history and problem list.  Review of Systems Pertinent items noted in HPI and remainder of comprehensive ROS otherwise negative.   Objective:    BP 133/61 mmHg  Pulse 83  Wt 221 lb (100.245 kg)  SpO2 100% General appearance: alert, cooperative and appears stated age Head: Normocephalic, without obvious abnormality, atraumatic Eyes: conj clear, EOMI, PEERLA Ears: normal TM's and external ear canals both ears Nose: Nares normal. Septum midline. Mucosa normal. No drainage or sinus tenderness. Throat: lips, mucosa, and tongue normal; teeth and gums normal Neck: no adenopathy, no carotid bruit, no JVD, supple, symmetrical, trachea midline and thyroid not enlarged, symmetric, no tenderness/mass/nodules Back: symmetric, no curvature. ROM normal. No CVA tenderness. Lungs: clear to  auscultation bilaterally Heart: regular rate and rhythm, S1, S2 normal, no murmur, click, rub or gallop Abdomen: soft, non-tender; bowel sounds normal; no masses,  no organomegaly Extremities: extremities normal, atraumatic, no cyanosis or edema Pulses: 2+ and symmetric Skin: Skin color, texture, turgor normal. No rashes or lesions or vesicular lesion at corner of the right eye. Lymph nodes: Cervical adenopathy: nl and Supraclavicular adenopathy: nl Neurologic: Alert and oriented X 3, normal strength and tone. Normal symmetric reflexes. Normal coordination and gait    Assessment:    Healthy female exam.      Plan:     See After Visit Summary for Counseling Recommendations   Keep up a regular exercise program and make sure you are eating a healthy diet.  Try to eat 4 servings of dairy a day, or if you are lactose intolerant take a calcium with vitamin D daily.  Your vaccines are up to date.   Vit D - recheck in 3 months. She is taking 50000 unit cap once a day.    Iron def anemia -  Recheck level in 3 mo. Add stool softener since getting some constipation.   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 Refer to Dermatology for Lancing with possible cautery.

## 2015-06-03 NOTE — Progress Notes (Signed)
Subjective:    Elizabeth Copeland is a 45 y.o. SW P70 (50 yo daughter)  female who presents for an annual exam. The patient has no complaints today. The patient is not currently sexually active. GYN screening history: last pap: was normal. The patient wears seatbelts: yes. The patient participates in regular exercise: yes. Has the patient ever been transfused or tattooed?: no. The patient reports that there is not domestic violence in her life.   Menstrual History: OB History    Gravida Para Term Preterm AB TAB SAB Ectopic Multiple Living   1 1        1       Menarche age: 89  Patient's last menstrual period was 05/20/2015.    The following portions of the patient's history were reviewed and updated as appropriate: allergies, current medications, past family history, past medical history, past social history, past surgical history and problem list.  Review of Systems Pertinent items noted in HPI and remainder of comprehensive ROS otherwise negative.    Objective:    BP 133/61 mmHg  Pulse 83  Resp 16  Ht 5\' 2"  (1.575 m)  Wt 221 lb (100.245 kg)  BMI 40.41 kg/m2  LMP 05/20/2015  General Appearance:    Alert, cooperative, no distress, appears stated age  Head:    Normocephalic, without obvious abnormality, atraumatic  Eyes:    PERRL, conjunctiva/corneas clear, EOM's intact, fundi    benign, both eyes  Ears:    Normal TM's and external ear canals, both ears  Nose:   Nares normal, septum midline, mucosa normal, no drainage    or sinus tenderness  Throat:   Lips, mucosa, and tongue normal; teeth and gums normal  Neck:   Supple, symmetrical, trachea midline, no adenopathy;    thyroid:  no enlargement/tenderness/nodules; no carotid   bruit or JVD  Back:     Symmetric, no curvature, ROM normal, no CVA tenderness  Lungs:     Clear to auscultation bilaterally, respirations unlabored  Chest Wall:    No tenderness or deformity   Heart:    Regular rate and rhythm, S1 and S2 normal, no murmur, rub    or gallop  Breast Exam:    2 cm mobile mass at the 10 o'clock position of the right breast, otherwise, both breasts with a normal exam  Abdomen:     Soft, non-tender, bowel sounds active all four quadrants,    no masses, no organomegaly, obese, uterus palpable at about U-2  Genitalia:    Normal female without lesion, discharge or tenderness, I was unable to get a pap smear as her cervix was very anterior, her vagina very long, and she was very nervous about the exam (very little relaxation)     Extremities:   Extremities normal, atraumatic, no cyanosis or edema  Pulses:   2+ and symmetric all extremities  Skin:   Skin color, texture, turgor normal, no rashes or lesions  Lymph nodes:   Cervical, supraclavicular, and axillary nodes normal  Neurologic:   CNII-XII intact, normal strength, sensation and reflexes    throughout  .    Assessment:    Healthy female exam.   Fibroids, anemia   Plan:     Breast self exam technique reviewed and patient encouraged to perform self-exam monthly. Thin prep Pap smear. with cotesting Start megace 80 mg BID Check gyn u/s Consider hysterectomy

## 2015-06-09 ENCOUNTER — Ambulatory Visit (HOSPITAL_BASED_OUTPATIENT_CLINIC_OR_DEPARTMENT_OTHER)
Admission: RE | Admit: 2015-06-09 | Discharge: 2015-06-09 | Disposition: A | Discharge status: Home / Self Care | Payer: BLUE CROSS/BLUE SHIELD | Source: Ambulatory Visit | Attending: Obstetrics & Gynecology | Admitting: Obstetrics & Gynecology

## 2015-06-09 DIAGNOSIS — D251 Intramural leiomyoma of uterus: Secondary | ICD-10-CM

## 2015-06-10 ENCOUNTER — Other Ambulatory Visit (HOSPITAL_BASED_OUTPATIENT_CLINIC_OR_DEPARTMENT_OTHER): Payer: BLUE CROSS/BLUE SHIELD

## 2015-06-12 ENCOUNTER — Telehealth: Payer: Self-pay | Admitting: Obstetrics & Gynecology

## 2015-06-12 DIAGNOSIS — D251 Intramural leiomyoma of uterus: Secondary | ICD-10-CM

## 2015-06-12 NOTE — Telephone Encounter (Signed)
Her u/s showed a large fibroid and they recommend an MRI.

## 2015-06-15 ENCOUNTER — Ambulatory Visit (INDEPENDENT_AMBULATORY_CARE_PROVIDER_SITE_OTHER): Payer: BLUE CROSS/BLUE SHIELD

## 2015-06-15 DIAGNOSIS — D251 Intramural leiomyoma of uterus: Secondary | ICD-10-CM

## 2015-06-15 DIAGNOSIS — D259 Leiomyoma of uterus, unspecified: Secondary | ICD-10-CM

## 2015-06-15 MED ORDER — GADOBENATE DIMEGLUMINE 529 MG/ML IV SOLN
20.0000 mL | Freq: Once | INTRAVENOUS | Status: AC | PRN
  Administered 2015-06-15: 20 mL via INTRAVENOUS

## 2015-06-17 ENCOUNTER — Ambulatory Visit (INDEPENDENT_AMBULATORY_CARE_PROVIDER_SITE_OTHER): Payer: BLUE CROSS/BLUE SHIELD | Admitting: Obstetrics & Gynecology

## 2015-06-17 ENCOUNTER — Encounter: Payer: Self-pay | Admitting: Obstetrics & Gynecology

## 2015-06-17 VITALS — BP 143/83 | HR 94 | Resp 16 | Ht 62.0 in | Wt 221.0 lb

## 2015-06-17 DIAGNOSIS — D259 Leiomyoma of uterus, unspecified: Secondary | ICD-10-CM

## 2015-06-17 DIAGNOSIS — D500 Iron deficiency anemia secondary to blood loss (chronic): Secondary | ICD-10-CM

## 2015-06-17 LAB — CBC
HEMATOCRIT: 26.6 % — AB (ref 36.0–46.0)
HEMOGLOBIN: 7.1 g/dL — AB (ref 12.0–15.0)
MCH: 15.5 pg — AB (ref 26.0–34.0)
MCHC: 26.7 g/dL — AB (ref 30.0–36.0)
MCV: 58.1 fL — AB (ref 78.0–100.0)
Platelets: 514 10*3/uL — ABNORMAL HIGH (ref 150–400)
RBC: 4.58 MIL/uL (ref 3.87–5.11)
RDW: 20.9 % — AB (ref 11.5–15.5)
WBC: 8.1 10*3/uL (ref 4.0–10.5)

## 2015-06-17 NOTE — Progress Notes (Signed)
   Subjective:    Patient ID: Elizabeth Copeland, female    DOB: 12/27/1970, 45 y.o.   MRN: [id number]  HPI 45 yo lady with known fibroids here to discuss her MRI results. MRI shows large fibroids. HBG 6.6. Her bleeding has stopped with megace. She changed to once daily megace instead of BID due to breast tenderness.   Review of Systems     Objective:   Physical Exam [id number] Breathing, conversing, and ambulating normally    Assessment & Plan:   fibroids, anemia, morbid obesity- I discussed TAH versus [place], depot Lupron.  She wants to try medical management first. Lupron ordered Amb ref int radiol Recheck CBC Offered transfusion (She would like to defer this at this time)

## 2015-06-19 ENCOUNTER — Ambulatory Visit: Payer: BLUE CROSS/BLUE SHIELD

## 2015-06-19 ENCOUNTER — Ambulatory Visit (INDEPENDENT_AMBULATORY_CARE_PROVIDER_SITE_OTHER): Payer: BLUE CROSS/BLUE SHIELD | Admitting: *Deleted

## 2015-06-19 DIAGNOSIS — D259 Leiomyoma of uterus, unspecified: Secondary | ICD-10-CM

## 2015-06-19 MED ORDER — LEUPROLIDE ACETATE 3.75 MG IM KIT
3.7500 mg | PACK | Freq: Once | INTRAMUSCULAR | Status: AC
  Administered 2015-06-19: 3.75 mg via INTRAMUSCULAR

## 2015-06-24 ENCOUNTER — Other Ambulatory Visit: Payer: BLUE CROSS/BLUE SHIELD

## 2015-06-28 ENCOUNTER — Other Ambulatory Visit: Payer: Self-pay | Admitting: Sports Medicine

## 2015-07-01 ENCOUNTER — Ambulatory Visit
Admission: RE | Admit: 2015-07-01 | Discharge: 2015-07-01 | Disposition: A | Discharge status: Home / Self Care | Payer: BLUE CROSS/BLUE SHIELD | Source: Ambulatory Visit | Attending: Obstetrics & Gynecology | Admitting: Obstetrics & Gynecology

## 2015-07-01 DIAGNOSIS — N6310 Unspecified lump in the right breast, unspecified quadrant: Secondary | ICD-10-CM

## 2015-07-02 ENCOUNTER — Other Ambulatory Visit (HOSPITAL_COMMUNITY): Payer: Self-pay | Admitting: Obstetrics & Gynecology

## 2015-07-02 ENCOUNTER — Encounter: Payer: Self-pay | Admitting: Obstetrics & Gynecology

## 2015-07-02 DIAGNOSIS — D251 Intramural leiomyoma of uterus: Secondary | ICD-10-CM

## 2015-07-04 ENCOUNTER — Emergency Department (INDEPENDENT_AMBULATORY_CARE_PROVIDER_SITE_OTHER)
Admission: EM | Admit: 2015-07-04 | Discharge: 2015-07-04 | Disposition: A | Discharge status: Home / Self Care | Payer: BLUE CROSS/BLUE SHIELD | Source: Home / Self Care | Attending: Family Medicine | Admitting: Family Medicine

## 2015-07-04 ENCOUNTER — Encounter: Payer: Self-pay | Admitting: Emergency Medicine

## 2015-07-04 DIAGNOSIS — R3915 Urgency of urination: Secondary | ICD-10-CM

## 2015-07-04 DIAGNOSIS — R300 Dysuria: Secondary | ICD-10-CM | POA: Diagnosis not present

## 2015-07-04 LAB — POCT URINALYSIS DIP (MANUAL ENTRY)
BILIRUBIN UA: NEGATIVE
BILIRUBIN UA: NEGATIVE
GLUCOSE UA: NEGATIVE
LEUKOCYTES UA: NEGATIVE
NITRITE UA: NEGATIVE
Spec Grav, UA: 1.015
Urobilinogen, UA: 0.2
pH, UA: 6

## 2015-07-04 NOTE — Discharge Instructions (Signed)
Continue adequate fluid intake

## 2015-07-04 NOTE — ED Notes (Signed)
Pt c/o dysuria off and on, itching, pain and pressure in lower abdomen x 4 days.  Some relief with external Monistat cream.

## 2015-07-04 NOTE — ED Provider Notes (Signed)
CSN: [id number]     Arrival date & time 07/04/15  1110 History   First MD Initiated Contact with Patient 07/04/15 1309     Chief Complaint  Patient presents with  . Urinary Tract Infection      HPI Comments: Patient complains of 1.5 week history of dysuria, urgency, vaginal itching/burning without discharge.  No fevers, chills, and sweats.  No abdominal/pelvic pain or back pain. She states that she has had increased spotting during the past 2.5 weeks, and has an appointment scheduled with Dr. Hulan Fray  Patient is a 45 y.o. female presenting with dysuria. The history is provided by the patient.  Dysuria Pain quality:  Burning Pain severity:  Mild Onset quality:  Gradual Duration:  10 days Timing:  Constant Progression:  Unchanged Chronicity:  New Recent urinary tract infections: no   Relieved by:  None tried Worsened by:  Nothing tried Ineffective treatments:  None tried Urinary symptoms: frequent urination and hesitancy   Urinary symptoms: no discolored urine, no foul-smelling urine, no hematuria and no bladder incontinence   Associated symptoms: no abdominal pain, no fever, no flank pain, no genital lesions, no nausea, no vaginal discharge and no vomiting     Past Medical History  Diagnosis Date  . Back pain   . Morbid obesity ([place])   . Fibroid    Past Surgical History  Procedure Laterality Date  . Cesarean section  2000   Family History  Problem Relation Age of Onset  . Hypertension    . Diabetes Father    Social History  Substance Use Topics  . Smoking status: Former Smoker    Types: Cigarettes    Quit date: 04/20/2010  . Smokeless tobacco: None  . Alcohol Use: Yes     Comment: social   OB History    Gravida Para Term Preterm AB TAB SAB Ectopic Multiple Living   1 1        1      Review of Systems  Constitutional: Negative for fever.  Gastrointestinal: Negative for nausea, vomiting and abdominal pain.  Genitourinary: Positive for dysuria. Negative for flank  pain and vaginal discharge.  All other systems reviewed and are negative.   Allergies  Review of patient's allergies indicates no known allergies.  Home Medications   Prior to Admission medications   Medication Sig Start Date End Date Taking? Authorizing Provider  gabapentin (NEURONTIN) 800 MG tablet 1 tab in the morning, 1 tab midday, and 2 tabs at bedtime. 05/06/15   Silverio Decamp, MD  Iron-FA-B Cmp-C-Biot-Probiotic (FUSION PLUS) CAPS Take 1 capsule by mouth daily. 05/11/15   Silverio Decamp, MD  megestrol (MEGACE) 40 MG tablet Take 2 tablets (80 mg total) by mouth 2 (two) times daily. 06/03/15   Emily Filbert, MD  omeprazole (PRILOSEC) 40 MG capsule Take 1 capsule (40 mg total) by mouth daily. 01/03/14   Hali Marry, MD  Vitamin D, Ergocalciferol, (DRISDOL) 50000 units CAPS capsule TAKE ONE CAPSULE BY MOUTH EVERY 7 DAYS FOR 8 WEEKS TOTAL 06/29/15   Silverio Decamp, MD   Meds Ordered and Administered this Visit  Medications - No data to display  BP 147/98 mmHg  Pulse 86  Temp(Src) 98.6 F (37 C) (Oral)  Ht 5\' 3"  (1.6 m)  Wt 222 lb 8 oz (100.925 kg)  BMI 39.42 kg/m2  SpO2 100%  LMP 05/25/2015 No data found.   Physical Exam Nursing notes and Vital Signs reviewed. Appearance:  Patient appears stated age,  and in no acute distress.  Patient is obese (BMI 39.4)   Eyes:  Pupils are equal, round, and reactive to light and accomodation.  Extraocular movement is intact.  Conjunctivae are not inflamed   Pharynx:  Normal; moist mucous membranes  Neck:  Supple.  No adenopathy Lungs:  Clear to auscultation.  Breath sounds are equal.  Moving air well. Heart:  Regular rate and rhythm without murmurs, rubs, or gallops.  Abdomen:  Nontender without masses or hepatosplenomegaly.  Bowel sounds are present.  No CVA or flank tenderness.  Extremities:  No edema.  Skin:  No rash present.    ED Course  Procedures none     Labs Reviewed  POCT URINALYSIS DIP (MANUAL ENTRY) -  Abnormal; Notable for the following:    Blood, UA moderate (*)    Protein Ur, POC trace (*)    All other components within normal limits     MDM   1. Urinary urgency    Urine culture pending. Follow with Dr. Hulan Fray as scheduled    Kandra Nicolas, MD 07/06/15 [phone]

## 2015-07-05 LAB — URINE CULTURE

## 2015-07-06 ENCOUNTER — Telehealth: Payer: Self-pay

## 2015-07-06 ENCOUNTER — Telehealth: Payer: Self-pay | Admitting: Emergency Medicine

## 2015-07-08 ENCOUNTER — Encounter: Payer: Self-pay | Admitting: Radiology

## 2015-07-08 ENCOUNTER — Ambulatory Visit
Admission: RE | Admit: 2015-07-08 | Discharge: 2015-07-08 | Disposition: A | Discharge status: Home / Self Care | Payer: BLUE CROSS/BLUE SHIELD | Source: Ambulatory Visit | Attending: Obstetrics & Gynecology | Admitting: Obstetrics & Gynecology

## 2015-07-08 DIAGNOSIS — D251 Intramural leiomyoma of uterus: Secondary | ICD-10-CM

## 2015-07-08 DIAGNOSIS — D259 Leiomyoma of uterus, unspecified: Secondary | ICD-10-CM | POA: Diagnosis not present

## 2015-07-08 NOTE — Consult Note (Signed)
Chief Complaint: Patient was seen in consultation today for  Chief Complaint  Patient presents with  . Advice Only    Consult for [place]     at the request of Dove,Myra C  Referring Physician(s): Dove,Myra C  History of Present Illness: Elizabeth Copeland is a 44 y.o. female with known uterine fibroids for many years. She was told that she had fibroids 16 years ago after the birth of her daughter. She has experienced heavy menstrual bleeding for many years and was recently told that she is anemic. Many years ago she used oral contraceptive pills which shorten the number of bleeding days but did not really change the heavy menstrual bleeding. Patient describes a laparoscopic procedure many years ago which may have represented a myomectomy but this is uncertain. She had relatively regular menstrual cycles until recently. Menstrual bleeding typically lasted 7-8 days with heavy bleeding lasting 4-5 days. During the heavy bleeding she is changing pads every 1-2 hours. She recently started Megace and had a Lupron shot approximately 2 weeks. She has developed interperiod spotting since the Lupron injection. She has used iron for anemia in the past but has problems with constipation associated with the iron supplementation. She also complains of frequent urinations, bloating and lower abdominal pressure. Patient's other main complaint is chronic back pain. She points to the pain in her lower back and says that the pain radiates to the right foot. Her last Pap smear was in 2014. Patient is planning to have another Pap smear in the near future. Patient has already had a pelvic ultrasound and MRI.  Past Medical History  Diagnosis Date  . Back pain   . Morbid obesity ([place])   . Fibroid     Past Surgical History  Procedure Laterality Date  . Cesarean section  2000    Allergies: Review of patient's allergies indicates no known allergies.  Medications: Prior to Admission medications   Medication Sig  Start Date End Date Taking? Authorizing Provider  cephALEXin (KEFLEX) 500 MG capsule Take 500 mg by mouth 2 (two) times daily.   Yes Historical Provider, MD  gabapentin (NEURONTIN) 800 MG tablet 1 tab in the morning, 1 tab midday, and 2 tabs at bedtime. 05/06/15  Yes Silverio Decamp, MD  Iron-FA-B Cmp-C-Biot-Probiotic (FUSION PLUS) CAPS Take 1 capsule by mouth daily. 05/11/15  Yes Silverio Decamp, MD  megestrol (MEGACE) 40 MG tablet Take 2 tablets (80 mg total) by mouth 2 (two) times daily. 06/03/15  Yes Emily Filbert, MD  omeprazole (PRILOSEC) 40 MG capsule Take 1 capsule (40 mg total) by mouth daily. 01/03/14  Yes Hali Marry, MD  Vitamin D, Ergocalciferol, (DRISDOL) 50000 units CAPS capsule TAKE ONE CAPSULE BY MOUTH EVERY 7 DAYS FOR 8 WEEKS TOTAL 06/29/15  Yes Silverio Decamp, MD     Family History  Problem Relation Age of Onset  . Hypertension    . Diabetes Father     Social History   Social History  . Marital Status: Single    Spouse Name: N/A  . Number of Children: 1  . Years of Education: 2 yr coll   Occupational History  . member Building control surveyor [employer]   Social History Main Topics  . Smoking status: Former Smoker    Types: Cigarettes    Quit date: 04/20/2010  . Smokeless tobacco: Not on file  . Alcohol Use: Yes     Comment: social  . Drug Use:  No  . Sexual Activity: Not Currently   Other Topics Concern  . Not on file   Social History Narrative   No regular exercise.  Lives with her mother [name] and father Jolan Sundt     Review of Systems  Constitutional: Negative for fever and activity change.  Respiratory: Negative.   Cardiovascular: Negative.   Gastrointestinal: Positive for abdominal pain and constipation.  Genitourinary: Positive for frequency and pelvic pain.  Musculoskeletal: Positive for back pain.    Vital Signs: BP 118/63 mmHg  Pulse 85  Temp(Src) 98.7 F (37.1 C) (Oral)  Resp 15  Ht 5\' 2"   (1.575 m)  Wt 222 lb (100.699 kg)  BMI 40.59 kg/m2  SpO2 100%  LMP 05/25/2015  Physical Exam  Cardiovascular: Normal rate, regular rhythm, normal heart sounds and intact distal pulses.   Pulmonary/Chest: Effort normal and breath sounds normal. No respiratory distress. She has no wheezes. She has no rales. She exhibits no tenderness.  Abdominal: Soft. Bowel sounds are normal. She exhibits no distension. There is tenderness.  Very tender to light palpation in lower abdomen and pelvis.    Mallampati Score: 2     Imaging: Mr Pelvis W Wo Contrast  06/15/2015  CLINICAL DATA:  Patient with pelvic pain. Multiple uterine fibroids. EXAM: MRI PELVIS WITHOUT AND WITH CONTRAST TECHNIQUE: Multiplanar multisequence MR imaging of the pelvis was performed both before and after administration of intravenous contrast. CONTRAST:  64mL MULTIHANCE GADOBENATE DIMEGLUMINE 529 MG/ML IV SOLN COMPARISON:  Pelvic ultrasound 06/09/2015 FINDINGS: Uterus:  Measures 15.8 x 9.4 x 9.9 cm. Fibroids: There is a dominant intramural fibroid with a large submucosal component within the posterior uterine body measuring 5.9 x 7.3 x 6.9 cm. This fibroid demonstrates homogeneous contrast enhancement. There is a 3.8 x 3.8 x 3.7 cm subserosal fibroid off of the right aspect of the uterine fundus which demonstrates homogeneous contrast enhancement. There is a 2.2 x 1.6 x 1.4 cm subserosal fibroid within the right aspect of the uterine body (image 21; series 2) which demonstrates homogeneous enhancement. Intracavitary fibroids:  None. Pedunculated fibroids: None. Fibroid contrast enhancement: All fibroids show diffuse contrast enhancement, without significant internal degeneration/devascularization. Cervix:  Normal appearance. Right ovary:  Normal in appearance.  Measures 1.4 x 2.2 x 1.6 cm. Left ovary:  Normal in appearance.  Measures 3.1 x 2.4 x 2.0 cm. Free fluid:  None. Other: No other pelvic masses, lymphadenopathy, or inflammatory  process identified. Probable incompletely visualized cyst off the inferior pole of the left kidney. Mild heterogeneity of the endometrium, favor small amount of blood products. IMPRESSION: Multiple uterine fibroids with a large dominant fibroid within the posterior uterine body which has a large submucosal component. Electronically Signed   By: Lovey Newcomer M.D.   On: 06/15/2015 09:44   US Transvaginal Non-ob  06/09/2015  CLINICAL DATA:  45 year old female with reported history of uterine fibroids and anemia. LMP 05/20/2015. EXAM: TRANSABDOMINAL AND TRANSVAGINAL ULTRASOUND OF PELVIS TECHNIQUE: Both transabdominal and transvaginal ultrasound examinations of the pelvis were performed. Transabdominal technique was performed for global imaging of the pelvis including uterus, ovaries, adnexal regions, and pelvic cul-de-sac. It was necessary to proceed with endovaginal exam following the transabdominal exam to visualize the endometrium, myometrium, ovaries and adnexa. COMPARISON:  None FINDINGS: Uterus Measurements: 14.0 x 9.0 x 10.9 cm. The uterus is prominently enlarged by uterine fibroids. The transvaginal images of the uterus are essentially nondiagnostic due to slightly retroverted uterine position and enlarged size. Uterine fibroids as follows (using transabdominal  measurements): - posterior uterine body 2.1 x 1.6 x 2.1 cm fibroid, which is probably submucosal - anterior fundal intramural 3.3 x 2.6 x 3.5 cm fibroid - posterior upper uterine body intramural 8.1 x 5.6 x 8.3 cm fibroid Endometrium Endometrial visualization is limited by mass effect/distortion by the surrounding fibroids. Estimated bilayer endometrial thickness is 16 mm. No endometrial cavity fluid or focal endometrial mass demonstrated. Right ovary Right ovary not visualized on the transabdominal or transvaginal portions of the study. No right adnexal mass. Left ovary Left ovary not visualized on the transabdominal or transvaginal portions of the  study. No left adnexal mass. Other findings No abnormal free fluid. IMPRESSION: 1. Prominently enlarged myomatous uterus, with probable submucosal fibroid in the posterior uterine body. Consider further evaluation with MRI pelvis and/or saline infusion sonohysterography to further delineate fibroid locations, as clinically warranted. 2. Limited endometrial visualization due to mass effect/distortion by surrounding fibroids. Estimated bilayer endometrial thickness 16 mm, which is borderline abnormally thickened, possibly due to the secretory phase of the menstrual cycle. No discrete endometrial mass. Consider follow-up by [place] in 6-8 weeks, during the week immediately following menses (exam timing is critical). 3. Nonvisualization of the ovaries. No suspicious adnexal findings. No abnormal free fluid in the pelvis. Electronically Signed   By: Ilona Sorrel M.D.   On: 06/09/2015 13:24   US Pelvis Complete  06/09/2015  CLINICAL DATA:  45 year old female with reported history of uterine fibroids and anemia. LMP 05/20/2015. EXAM: TRANSABDOMINAL AND TRANSVAGINAL ULTRASOUND OF PELVIS TECHNIQUE: Both transabdominal and transvaginal ultrasound examinations of the pelvis were performed. Transabdominal technique was performed for global imaging of the pelvis including uterus, ovaries, adnexal regions, and pelvic cul-de-sac. It was necessary to proceed with endovaginal exam following the transabdominal exam to visualize the endometrium, myometrium, ovaries and adnexa. COMPARISON:  None FINDINGS: Uterus Measurements: 14.0 x 9.0 x 10.9 cm. The uterus is prominently enlarged by uterine fibroids. The transvaginal images of the uterus are essentially nondiagnostic due to slightly retroverted uterine position and enlarged size. Uterine fibroids as follows (using transabdominal measurements): - posterior uterine body 2.1 x 1.6 x 2.1 cm fibroid, which is probably submucosal - anterior fundal intramural 3.3 x 2.6 x 3.5 cm fibroid -  posterior upper uterine body intramural 8.1 x 5.6 x 8.3 cm fibroid Endometrium Endometrial visualization is limited by mass effect/distortion by the surrounding fibroids. Estimated bilayer endometrial thickness is 16 mm. No endometrial cavity fluid or focal endometrial mass demonstrated. Right ovary Right ovary not visualized on the transabdominal or transvaginal portions of the study. No right adnexal mass. Left ovary Left ovary not visualized on the transabdominal or transvaginal portions of the study. No left adnexal mass. Other findings No abnormal free fluid. IMPRESSION: 1. Prominently enlarged myomatous uterus, with probable submucosal fibroid in the posterior uterine body. Consider further evaluation with MRI pelvis and/or saline infusion sonohysterography to further delineate fibroid locations, as clinically warranted. 2. Limited endometrial visualization due to mass effect/distortion by surrounding fibroids. Estimated bilayer endometrial thickness 16 mm, which is borderline abnormally thickened, possibly due to the secretory phase of the menstrual cycle. No discrete endometrial mass. Consider follow-up by [place] in 6-8 weeks, during the week immediately following menses (exam timing is critical). 3. Nonvisualization of the ovaries. No suspicious adnexal findings. No abnormal free fluid in the pelvis. Electronically Signed   By: Ilona Sorrel M.D.   On: 06/09/2015 13:24   US Breast Ltd Uni Right Inc Axilla  07/01/2015  CLINICAL DATA:  Patient presents for  bilateral diagnostic examination as patient's physician believes the previously biopsied benign mass over the upper outer right breast may be larger. Patient had ultrasound-guided core biopsy of a mass over the 11 o'clock position of the right breast November 2014 demonstrating fibroadenoma and PASH. EXAM: 2D DIGITAL DIAGNOSTIC bilateral MAMMOGRAM WITH CAD AND ADJUNCT TOMO ULTRASOUND right BREAST COMPARISON:  Previous exam(s). ACR Breast Density Category b:  There are scattered areas of fibroglandular density. FINDINGS: Examination demonstrates the previously biopsied mass over the upper outer right periareolar region containing post biopsy metallic clip centrally. This mass appears slightly smaller mammographically. Remainder the exam is unchanged. Mammographic images were processed with CAD. Targeted ultrasound is performed, showing slight decrease in size in patient's biopsy-proven benign mass over the 11 o'clock position of the right breast measuring 1.4 x 2.5 x 2.6 cm (previously 1.8 x 2.7 x 3.0 cm). This mass is hypoechoic, oval and circumscribed with parallel long axis. IMPRESSION: Slight decrease in size of biopsy-proven fibroadenoma/PASH over the 11 o'clock position of the right breast 4 cm from the nipple. RECOMMENDATION: Recommend continued annual bilateral screening mammographic followup. I have discussed the findings and recommendations with the patient. Results were also provided in writing at the conclusion of the visit. If applicable, a reminder letter will be sent to the patient regarding the next appointment. BI-RADS CATEGORY  2: Benign. Electronically Signed   By: Marin Olp M.D.   On: 07/01/2015 11:47   Mm Diag Breast Tomo Bilateral  07/01/2015  CLINICAL DATA:  Patient presents for bilateral diagnostic examination as patient's physician believes the previously biopsied benign mass over the upper outer right breast may be larger. Patient had ultrasound-guided core biopsy of a mass over the 11 o'clock position of the right breast November 2014 demonstrating fibroadenoma and PASH. EXAM: 2D DIGITAL DIAGNOSTIC bilateral MAMMOGRAM WITH CAD AND ADJUNCT TOMO ULTRASOUND right BREAST COMPARISON:  Previous exam(s). ACR Breast Density Category b: There are scattered areas of fibroglandular density. FINDINGS: Examination demonstrates the previously biopsied mass over the upper outer right periareolar region containing post biopsy metallic clip centrally.  This mass appears slightly smaller mammographically. Remainder the exam is unchanged. Mammographic images were processed with CAD. Targeted ultrasound is performed, showing slight decrease in size in patient's biopsy-proven benign mass over the 11 o'clock position of the right breast measuring 1.4 x 2.5 x 2.6 cm (previously 1.8 x 2.7 x 3.0 cm). This mass is hypoechoic, oval and circumscribed with parallel long axis. IMPRESSION: Slight decrease in size of biopsy-proven fibroadenoma/PASH over the 11 o'clock position of the right breast 4 cm from the nipple. RECOMMENDATION: Recommend continued annual bilateral screening mammographic followup. I have discussed the findings and recommendations with the patient. Results were also provided in writing at the conclusion of the visit. If applicable, a reminder letter will be sent to the patient regarding the next appointment. BI-RADS CATEGORY  2: Benign. Electronically Signed   By: Marin Olp M.D.   On: 07/01/2015 11:47    Labs:  CBC:  Recent Labs  05/06/15 1021 06/17/15 0949  WBC 9.7 8.1  HGB 6.6* 7.1*  HCT 25.2* 26.6*  PLT 474* 514*    COAGS: No results for input(s): INR, APTT in the last 8760 hours.  BMP:  Recent Labs  05/06/15 1021  NA 139  K 4.4  CL 104  CO2 23  GLUCOSE 82  BUN 9  CALCIUM 8.9  CREATININE 0.77    LIVER FUNCTION TESTS:  Recent Labs  05/06/15 1021  BILITOT 0.5  AST 19  ALT 12  ALKPHOS 53  PROT 6.8  ALBUMIN 4.1    TUMOR MARKERS: No results for input(s): AFPTM, CEA, CA199, CHROMGRNA in the last 8760 hours.  Assessment and Plan:  45 year old female with uterine fibroids, menorrhagia and anemia. Patient also complains of abdominal cramping and urinary frequency. After discussion with the patient, physical examination and review of imaging, I feel the patient would be a good candidate for uterine artery embolization procedure. We discussed uterine artery embolization procedure in depth. We discussed the  procedure risks which include bleeding, infection and vascular injury. We discussed the post procedure recovery which includes at least one night in the hospital and being out of work for a minimum of one week. Patient understands that we would not recommend future pregnancies after this procedure. In addition, I discussed the large posterior fibroid in the submucosal region. Patient understands that this fibroid could be at risk for causing vaginal discharge following the procedure. Other therapeutic options for uterine fibroids were discussed with the patient, including medical management and hysterectomy. At this time, the patient is favoring the uterine artery embolization procedure and would like to proceed with this procedure. The patient is scheduled to get a Pap smear and we'll also request for an endometrial biopsy at the same time. If there is no contraindications based on the endometrial biopsy, we'll plan for uterine artery embolization procedure in the near future.  Thank you for this interesting consult.  I greatly enjoyed meeting Elizabeth Copeland and look forward to participating in their care.  A copy of this report was sent to the requesting provider on this date.  Electronically Signed: Carylon Perches 07/08/2015, 10:52 AM   I spent a total of  30 Minutes in face to face in clinical consultation, greater than 50% of which was counseling/coordinating care for uterine fibroids and menorrhagia.

## 2015-07-21 ENCOUNTER — Ambulatory Visit (INDEPENDENT_AMBULATORY_CARE_PROVIDER_SITE_OTHER): Payer: BLUE CROSS/BLUE SHIELD | Admitting: Physician Assistant

## 2015-07-21 ENCOUNTER — Encounter: Payer: Self-pay | Admitting: Physician Assistant

## 2015-07-21 VITALS — BP 134/68 | HR 87 | Temp 98.9°F | Ht 62.0 in | Wt 221.0 lb

## 2015-07-21 DIAGNOSIS — H65192 Other acute nonsuppurative otitis media, left ear: Secondary | ICD-10-CM | POA: Diagnosis not present

## 2015-07-21 DIAGNOSIS — J069 Acute upper respiratory infection, unspecified: Secondary | ICD-10-CM

## 2015-07-21 DIAGNOSIS — R300 Dysuria: Secondary | ICD-10-CM | POA: Diagnosis not present

## 2015-07-21 DIAGNOSIS — R319 Hematuria, unspecified: Secondary | ICD-10-CM | POA: Diagnosis not present

## 2015-07-21 LAB — POCT URINALYSIS DIPSTICK
Bilirubin, UA: NEGATIVE
GLUCOSE UA: NEGATIVE
Ketones, UA: NEGATIVE
NITRITE UA: NEGATIVE
PROTEIN UA: 100
Spec Grav, UA: 1.02
UROBILINOGEN UA: 0.2
pH, UA: 5

## 2015-07-21 MED ORDER — AMOXICILLIN-POT CLAVULANATE 875-125 MG PO TABS: 1.0000 | ORAL_TABLET | Freq: Two times a day (BID) | ORAL | Status: DC

## 2015-07-21 NOTE — Progress Notes (Deleted)
   Subjective:    Patient ID: Elizabeth Copeland, female    DOB: September 12, 1970, 45 y.o.   MRN: [id number]  HPI    Review of Systems     Objective:   Physical Exam        Assessment & Plan:

## 2015-07-21 NOTE — Progress Notes (Addendum)
   Subjective:    Patient ID: Elizabeth Copeland, female    DOB: [birth year], 45 y.o.   MRN: [id number]  HPI Patient states she started not feeling well last Thursday. She reports sore throat, dry cough, congestion, fullness in her ears, and eye pain/pressure. Her left ear is painful. Patient denies fever. Reports body aches and chills. She has been taken Advil, Robitussin, Benedryl, and Zyrtec with no relief. No ill contacts.   Patient is also complaining of vaginal itching and dysuria since yesterday. Denies urgency or frequency. Reports some odor. Denies suprapubic or back pain. Patient has had menstrual bleeding since the end of March and is seeing GYN tomorrow.  She has dx of uterine fibriods.     Review of Systems  HENT: Positive for congestion and ear pain.   Eyes: Positive for pain.  Respiratory: Positive for cough. Negative for shortness of breath and wheezing.   Genitourinary: Positive for dysuria. Negative for urgency, frequency and pelvic pain.  Neurological: Positive for headaches.       Objective:   Physical Exam  Constitutional: She appears well-developed and well-nourished.  HENT:  Left Ear: There is tenderness. Tympanic membrane is injected and erythematous. Tympanic membrane mobility is abnormal.  Cardiovascular: Normal rate, regular rhythm and normal heart sounds.   Pulmonary/Chest: No respiratory distress. She has no wheezes. She has no rales.  Abdominal: There is tenderness (LLQ. Patient has a history of fibroids and states this tenderness is normal for her.). There is rebound (LLQ). There is no CVA tenderness.  Lymphadenopathy:    She has cervical adenopathy.          Assessment & Plan:  Acute URI- Patient has had symptoms x6 days with no relief from OTC medications. Will treat with Augmentin.   Left otitis media- augmentin should treat.   Dysuria/UTI-.Marland Kitchen Results for orders placed or performed in visit on 07/21/15  POCT urinalysis dipstick  Result Value Ref  Range   Color, UA red    Clarity, UA turbid    Glucose, UA neg    Bilirubin, UA neg    Ketones, UA neg    Spec Grav, UA 1.020    Blood, UA large    pH, UA 5.0    Protein, UA 100    Urobilinogen, UA 0.2    Nitrite, UA neg    Leukocytes, UA small (1+) (A) Negative    UA showed large blood, small leukocytes, protein, and negative nitrates. Will send urine for culture. Treat with Augmentin. Follow up as needed.   Uterine fibriod with menorrhagia and anemia- managed by Dr. Hulan Fray. Not a surgical candidate due to weight. See discussion below.   Morbid obesity- discussed diet changes. Until bleeding and anemia improve hold on exercise. Discussed medication options; however, would like to hold off until pt is feeling better.

## 2015-07-22 ENCOUNTER — Ambulatory Visit (INDEPENDENT_AMBULATORY_CARE_PROVIDER_SITE_OTHER): Payer: BLUE CROSS/BLUE SHIELD | Admitting: Obstetrics & Gynecology

## 2015-07-22 ENCOUNTER — Encounter: Payer: Self-pay | Admitting: Obstetrics & Gynecology

## 2015-07-22 VITALS — BP 141/89 | HR 100 | Ht 63.0 in | Wt 221.0 lb

## 2015-07-22 DIAGNOSIS — D500 Iron deficiency anemia secondary to blood loss (chronic): Secondary | ICD-10-CM | POA: Diagnosis not present

## 2015-07-22 DIAGNOSIS — Z01419 Encounter for gynecological examination (general) (routine) without abnormal findings: Secondary | ICD-10-CM

## 2015-07-22 DIAGNOSIS — N938 Other specified abnormal uterine and vaginal bleeding: Secondary | ICD-10-CM

## 2015-07-22 MED ORDER — MISOPROSTOL 200 MCG PO TABS: ORAL_TABLET | ORAL | Status: DC

## 2015-07-22 NOTE — Progress Notes (Signed)
Subjective:    Elizabeth Copeland is a 45 y.o. 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 The patient has no complaints today except continued DUB. She has seen the radiologist about getting a [place] and this will be scheduled after a pap and EMBX. The patient is not currently sexually active. GYN screening history: last pap: was normal. The patient wears seatbelts: yes. The patient participates in regular exercise: no. Has the patient ever been transfused or tattooed?: no. The patient reports that there is not domestic violence in her life.   Menstrual History: OB History    Gravida Para Term Preterm AB TAB SAB Ectopic Multiple Living   1 1        1       Menarche age: 2  Patient's last menstrual period was 06/28/2015.    The following portions of the patient's history were reviewed and updated as appropriate: allergies, current medications, past family history, past medical history, past social history, past surgical history and problem list.  Review of Systems Pertinent items are noted in HPI. 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 Using megace to help decrease bleeding. Lots of fibroids on u/s/MRI. She declines a flu vaccine. Last mammogram UTD.   Objective:    BP 141/89 mmHg  Pulse 100  Ht 5\' 3"  (1.6 m)  Wt 221 lb (100.245 kg)  BMI 39.16 kg/m2  LMP 06/28/2015  General Appearance:    Alert, cooperative, no distress, appears stated age  Head:    Normocephalic, without obvious abnormality, atraumatic  Eyes:    PERRL, conjunctiva/corneas clear, EOM's intact, fundi    benign, both eyes  Ears:    Normal TM's and external ear canals, both ears  Nose:   Nares normal, septum midline, mucosa normal, no drainage    or sinus tenderness  Throat:   Lips, mucosa, and tongue normal; teeth and gums normal  Neck:   Supple, symmetrical, trachea midline, no adenopathy;    thyroid:  no enlargement/tenderness/nodules; no carotid   bruit or JVD  Back:     Symmetric, no curvature, ROM  normal, no CVA tenderness  Lungs:     Clear to auscultation bilaterally, respirations unlabored  Chest Wall:    No tenderness or deformity   Heart:    Regular rate and rhythm, S1 and S2 normal, no murmur, rub   or gallop  Breast Exam:    No tenderness, masses, or nipple abnormality  Abdomen:     Soft, non-tender, bowel sounds active all four quadrants,    no masses, no organomegaly  Genitalia:    Normal female without lesion, discharge or tenderness, uterus enlarged, about 16 week size. Her cervix is so much deviated anteriorly and cephalad that I do not have a speculum that will allow me to even visualize the cervix     Extremities:   Extremities normal, atraumatic, no cyanosis or edema  Pulses:   2+ and symmetric all extremities  Skin:   Skin color, texture, turgor normal, no rashes or lesions  Lymph nodes:   Cervical, supraclavicular, and axillary nodes normal  Neurologic:   CNII-XII intact, normal strength, sensation and reflexes    throughout  .    Assessment:    Healthy female exam.   DUB   Plan:     Thin prep Pap smear. with cotesting will need to be done in the OR along with a d&c Pretreat with cytotec the night before d&c Email sent to  [place] to schedule this

## 2015-07-23 ENCOUNTER — Encounter (HOSPITAL_COMMUNITY): Payer: Self-pay | Admitting: *Deleted

## 2015-07-24 ENCOUNTER — Other Ambulatory Visit: Payer: Self-pay

## 2015-07-24 ENCOUNTER — Telehealth: Payer: Self-pay

## 2015-07-24 MED ORDER — SULFAMETHOXAZOLE-TRIMETHOPRIM 800-160 MG PO TABS: 1.0000 | ORAL_TABLET | Freq: Two times a day (BID) | ORAL | Status: DC

## 2015-07-24 NOTE — Telephone Encounter (Signed)
Left message for patient that new script sent.

## 2015-07-24 NOTE — Telephone Encounter (Signed)
Ok for bactrim DS 1 tablet bid for 10 days. #20 NRF

## 2015-07-25 LAB — URINE CULTURE: Colony Count: 60000

## 2015-08-05 DIAGNOSIS — Z1283 Encounter for screening for malignant neoplasm of skin: Secondary | ICD-10-CM | POA: Diagnosis not present

## 2015-08-05 DIAGNOSIS — D2339 Other benign neoplasm of skin of other parts of face: Secondary | ICD-10-CM | POA: Diagnosis not present

## 2015-08-05 DIAGNOSIS — Z809 Family history of malignant neoplasm, unspecified: Secondary | ICD-10-CM | POA: Diagnosis not present

## 2015-08-25 NOTE — Patient Instructions (Signed)
Your procedure is scheduled on:  Tuesday, Sep 01, 2015  Enter through the Main Entrance of [facility] at:  10:00 AM  Pick up the phone at the desk and dial [phone].  Call this number if you have problems the morning of surgery: [phone].  Remember: Do NOT eat food or drink after:  Midnight Monday  Take these medicines the morning of surgery with a SIP OF WATER: Gabapentin, Omeprazole  Do NOT wear jewelry (body piercing), metal hair clips/bobby pins, make-up, or nail polish. Do NOT wear lotions, powders, or perfumes.  You may wear deodorant. Do NOT shave for 48 hours prior to surgery. Do NOT bring valuables to the hospital. Contacts, dentures, or bridgework may not be worn into surgery.  Have a responsible adult drive you home and stay with you for 24 hours after your procedure

## 2015-08-26 ENCOUNTER — Encounter (HOSPITAL_COMMUNITY): Payer: Self-pay

## 2015-08-26 ENCOUNTER — Encounter (HOSPITAL_COMMUNITY)
Admission: RE | Admit: 2015-08-26 | Discharge: 2015-08-26 | Disposition: A | Discharge status: Home / Self Care | Payer: BLUE CROSS/BLUE SHIELD | Source: Ambulatory Visit | Attending: Obstetrics & Gynecology | Admitting: Obstetrics & Gynecology

## 2015-08-26 DIAGNOSIS — Z01812 Encounter for preprocedural laboratory examination: Secondary | ICD-10-CM | POA: Insufficient documentation

## 2015-08-26 HISTORY — DX: Headache, unspecified: R51.9

## 2015-08-26 HISTORY — DX: Adverse effect of unspecified anesthetic, initial encounter: T41.45XA

## 2015-08-26 HISTORY — DX: Headache: R51

## 2015-08-26 HISTORY — DX: Anemia, unspecified: D64.9

## 2015-08-26 HISTORY — DX: Other complications of anesthesia, initial encounter: T88.59XA

## 2015-08-26 HISTORY — DX: Pneumonia, unspecified organism: J18.9

## 2015-08-26 LAB — CBC
HEMATOCRIT: 28.6 % — AB (ref 36.0–46.0)
HEMOGLOBIN: 7.5 g/dL — AB (ref 12.0–15.0)
MCH: 15.5 pg — AB (ref 26.0–34.0)
MCHC: 26.2 g/dL — AB (ref 30.0–36.0)
MCV: 59.1 fL — ABNORMAL LOW (ref 78.0–100.0)
Platelets: 438 10*3/uL — ABNORMAL HIGH (ref 150–400)
RBC: 4.84 MIL/uL (ref 3.87–5.11)
RDW: 18.6 % — AB (ref 11.5–15.5)
WBC: 10.3 10*3/uL (ref 4.0–10.5)

## 2015-08-26 NOTE — Pre-Procedure Instructions (Signed)
Dr. Hulan Fray and Dr. Dian Situ notified of Elizabeth Copeland CBC results today.  Dr. Kalman Shan request a type and screen be drawn the day of surgery.  I spoke with the patient and encouraged her to try to resume some form of iron supplementation and increase her intake of iron rich foods.  [name] even though hesitant because of her previous negative experience with taking iron has agreed to resume the iron at this time.  She will start taking the iron every other day to see how she tolerates it.

## 2015-09-01 ENCOUNTER — Ambulatory Visit (HOSPITAL_COMMUNITY): Payer: BLUE CROSS/BLUE SHIELD | Admitting: Anesthesiology

## 2015-09-01 ENCOUNTER — Encounter (HOSPITAL_COMMUNITY)
Admission: RE | Disposition: A | Discharge status: Home / Self Care | Payer: Self-pay | Source: Ambulatory Visit | Attending: Obstetrics & Gynecology

## 2015-09-01 ENCOUNTER — Encounter (HOSPITAL_COMMUNITY): Payer: Self-pay | Admitting: Emergency Medicine

## 2015-09-01 ENCOUNTER — Ambulatory Visit (HOSPITAL_COMMUNITY)
Admission: RE | Admit: 2015-09-01 | Discharge: 2015-09-01 | Disposition: A | Discharge status: Home / Self Care | Payer: BLUE CROSS/BLUE SHIELD | Source: Ambulatory Visit | Attending: Obstetrics & Gynecology | Admitting: Obstetrics & Gynecology

## 2015-09-01 DIAGNOSIS — K219 Gastro-esophageal reflux disease without esophagitis: Secondary | ICD-10-CM | POA: Diagnosis not present

## 2015-09-01 DIAGNOSIS — D649 Anemia, unspecified: Secondary | ICD-10-CM | POA: Diagnosis not present

## 2015-09-01 DIAGNOSIS — N938 Other specified abnormal uterine and vaginal bleeding: Secondary | ICD-10-CM | POA: Insufficient documentation

## 2015-09-01 DIAGNOSIS — Z87891 Personal history of nicotine dependence: Secondary | ICD-10-CM | POA: Diagnosis not present

## 2015-09-01 DIAGNOSIS — D259 Leiomyoma of uterus, unspecified: Secondary | ICD-10-CM | POA: Diagnosis not present

## 2015-09-01 DIAGNOSIS — N840 Polyp of corpus uteri: Secondary | ICD-10-CM | POA: Diagnosis not present

## 2015-09-01 DIAGNOSIS — Z6839 Body mass index (BMI) 39.0-39.9, adult: Secondary | ICD-10-CM | POA: Insufficient documentation

## 2015-09-01 DIAGNOSIS — M549 Dorsalgia, unspecified: Secondary | ICD-10-CM | POA: Diagnosis not present

## 2015-09-01 HISTORY — DX: Family history of other specified conditions: Z84.89

## 2015-09-01 HISTORY — PX: DILATION AND CURETTAGE OF UTERUS: SHX78

## 2015-09-01 LAB — PREGNANCY, URINE: Preg Test, Ur: NEGATIVE

## 2015-09-01 SURGERY — EXAM UNDER ANESTHESIA
Anesthesia: General | Site: Vagina

## 2015-09-01 MED ORDER — ONDANSETRON HCL 4 MG/2ML IJ SOLN
INTRAMUSCULAR | Status: AC
  Filled 2015-09-01: qty 2

## 2015-09-01 MED ORDER — FENTANYL CITRATE (PF) 100 MCG/2ML IJ SOLN
25.0000 ug | INTRAMUSCULAR | Status: DC | PRN
  Administered 2015-09-01 (×4): 25 ug via INTRAVENOUS

## 2015-09-01 MED ORDER — MIDAZOLAM HCL 2 MG/2ML IJ SOLN
INTRAMUSCULAR | Status: DC | PRN
Start: 2015-09-01 — End: 2015-09-01
  Administered 2015-09-01: 2 mg via INTRAVENOUS

## 2015-09-01 MED ORDER — FENTANYL CITRATE (PF) 100 MCG/2ML IJ SOLN
INTRAMUSCULAR | Status: AC
  Filled 2015-09-01: qty 2

## 2015-09-01 MED ORDER — SUCCINYLCHOLINE CHLORIDE 20 MG/ML IJ SOLN
INTRAMUSCULAR | Status: AC
  Filled 2015-09-01: qty 1

## 2015-09-01 MED ORDER — KETOROLAC TROMETHAMINE 30 MG/ML IJ SOLN
INTRAMUSCULAR | Status: AC
  Filled 2015-09-01: qty 1

## 2015-09-01 MED ORDER — FENTANYL CITRATE (PF) 100 MCG/2ML IJ SOLN
INTRAMUSCULAR | Status: AC
  Administered 2015-09-01: 25 ug via INTRAVENOUS
  Filled 2015-09-01: qty 2

## 2015-09-01 MED ORDER — LACTATED RINGERS IV SOLN
INTRAVENOUS | Status: DC
  Administered 2015-09-01 (×2): via INTRAVENOUS

## 2015-09-01 MED ORDER — PROPOFOL 10 MG/ML IV BOLUS
INTRAVENOUS | Status: DC | PRN
  Administered 2015-09-01: 50 mg via INTRAVENOUS
  Administered 2015-09-01: 200 mg via INTRAVENOUS

## 2015-09-01 MED ORDER — OXYCODONE-ACETAMINOPHEN 5-325 MG PO TABS: 1.0000 | ORAL_TABLET | Freq: Four times a day (QID) | ORAL | Status: DC | PRN

## 2015-09-01 MED ORDER — FENTANYL CITRATE (PF) 100 MCG/2ML IJ SOLN
INTRAMUSCULAR | Status: DC | PRN
  Administered 2015-09-01: 100 ug via INTRAVENOUS
  Administered 2015-09-01 (×2): 50 ug via INTRAVENOUS

## 2015-09-01 MED ORDER — LACTATED RINGERS IV SOLN: INTRAVENOUS | Status: DC

## 2015-09-01 MED ORDER — LIDOCAINE HCL (CARDIAC) 20 MG/ML IV SOLN
INTRAVENOUS | Status: AC
  Filled 2015-09-01: qty 5

## 2015-09-01 MED ORDER — PROPOFOL 10 MG/ML IV BOLUS
INTRAVENOUS | Status: AC
  Filled 2015-09-01: qty 40

## 2015-09-01 MED ORDER — LIDOCAINE HCL (CARDIAC) 20 MG/ML IV SOLN
INTRAVENOUS | Status: DC | PRN
  Administered 2015-09-01: 100 mg via INTRAVENOUS

## 2015-09-01 MED ORDER — SCOPOLAMINE 1 MG/3DAYS TD PT72
1.0000 | MEDICATED_PATCH | Freq: Once | TRANSDERMAL | Status: DC
  Administered 2015-09-01: 1.5 mg via TRANSDERMAL

## 2015-09-01 MED ORDER — BUPIVACAINE HCL (PF) 0.5 % IJ SOLN
INTRAMUSCULAR | Status: AC
  Filled 2015-09-01: qty 30

## 2015-09-01 MED ORDER — KETOROLAC TROMETHAMINE 30 MG/ML IJ SOLN
INTRAMUSCULAR | Status: DC | PRN
  Administered 2015-09-01: 30 mg via INTRAVENOUS

## 2015-09-01 MED ORDER — MIDAZOLAM HCL 2 MG/2ML IJ SOLN
INTRAMUSCULAR | Status: AC
  Filled 2015-09-01: qty 2

## 2015-09-01 MED ORDER — DEXAMETHASONE SODIUM PHOSPHATE 4 MG/ML IJ SOLN
INTRAMUSCULAR | Status: AC
  Filled 2015-09-01: qty 1

## 2015-09-01 MED ORDER — SCOPOLAMINE 1 MG/3DAYS TD PT72
MEDICATED_PATCH | TRANSDERMAL | Status: AC
  Administered 2015-09-01: 1.5 mg via TRANSDERMAL
  Filled 2015-09-01: qty 1

## 2015-09-01 MED ORDER — ONDANSETRON HCL 4 MG/2ML IJ SOLN
INTRAMUSCULAR | Status: DC | PRN
  Administered 2015-09-01: 4 mg via INTRAVENOUS

## 2015-09-01 MED ORDER — DEXAMETHASONE SODIUM PHOSPHATE 10 MG/ML IJ SOLN
INTRAMUSCULAR | Status: DC | PRN
  Administered 2015-09-01: 4 mg via INTRAVENOUS

## 2015-09-01 SURGICAL SUPPLY — 18 items
CLOTH BEACON ORANGE TIMEOUT ST (SAFETY) ×3 IMPLANT
CONTAINER PREFILL 10% NBF 60ML (FORM) IMPLANT
COVER BACK TABLE 60X90IN (DRAPES) IMPLANT
DILATOR CANAL MILEX (MISCELLANEOUS) IMPLANT
GLOVE BIO SURGEON STRL SZ 6.5 (GLOVE) ×2 IMPLANT
GLOVE BIO SURGEONS STRL SZ 6.5 (GLOVE) ×1
GLOVE BIOGEL PI IND STRL 7.0 (GLOVE) ×1 IMPLANT
GLOVE BIOGEL PI INDICATOR 7.0 (GLOVE) ×2
GOWN STRL REUS W/TWL LRG LVL3 (GOWN DISPOSABLE) ×6 IMPLANT
KIT BERKELEY 1ST TRIMESTER 3/8 (MISCELLANEOUS) ×3 IMPLANT
NEEDLE SPNL 18GX3.5 QUINCKE PK (NEEDLE) ×3 IMPLANT
PACK VAGINAL MINOR WOMEN LF (CUSTOM PROCEDURE TRAY) ×3 IMPLANT
PAD OB MATERNITY 4.3X12.25 (PERSONAL CARE ITEMS) ×3 IMPLANT
PAD PREP 24X48 CUFFED NSTRL (MISCELLANEOUS) ×3 IMPLANT
PIPET BIOPSY ENDOMETRIAL 3MM (SUCTIONS) ×3 IMPLANT
SET BERKELEY SUCTION TUBING (SUCTIONS) ×3 IMPLANT
TOWEL OR 17X24 6PK STRL BLUE (TOWEL DISPOSABLE) ×6 IMPLANT
VACURETTE 6 ASPIR F TIP BERK (CANNULA) ×3 IMPLANT

## 2015-09-01 NOTE — Anesthesia Procedure Notes (Addendum)
Procedure Name: LMA Insertion Date/Time: 09/01/2015 12:08 PM Performed by: Jonna Munro Pre-anesthesia Checklist: Patient identified, Emergency Drugs available, Suction available, Patient being monitored and Timeout performed Patient Re-evaluated:Patient Re-evaluated prior to inductionOxygen Delivery Method: Circle system utilized Preoxygenation: Pre-oxygenation with 100% oxygen Intubation Type: IV induction Ventilation: Mask ventilation without difficulty LMA: LMA inserted LMA Size: 4.0 Number of attempts: 1 Placement Confirmation: positive ETCO2 and breath sounds checked- equal and bilateral Tube secured with: Tape Dental Injury: Teeth and Oropharynx as per pre-operative assessment    Procedure Name: Intubation Date/Time: 09/01/2015 12:24 PM Performed by: Jonna Munro Pre-anesthesia Checklist: Patient identified, Emergency Drugs available, Suction available, Patient being monitored and Timeout performed Patient Re-evaluated:Patient Re-evaluated prior to inductionOxygen Delivery Method: Circle system utilized Preoxygenation: Pre-oxygenation with 100% oxygen Intubation Type: IV induction Laryngoscope Size: Mac and 3 Grade View: Grade II Tube type: Oral Tube size: 7.0 mm Number of attempts: 2 Placement Confirmation: ETT inserted through vocal cords under direct vision,  positive ETCO2 and breath sounds checked- equal and bilateral Secured at: 22 cm Tube secured with: Tape Dental Injury: Teeth and Oropharynx as per pre-operative assessment  Comments: DL  By CRNA unable to see cords, DL by Dr. Landry Dyke, patient intubated atraumatically, +ETCO2, =BBS.

## 2015-09-01 NOTE — Anesthesia Preprocedure Evaluation (Signed)
Anesthesia Evaluation  Patient identified by MRN, date of birth, ID band Patient awake    Reviewed: Allergy & Precautions, H&P , NPO status , Patient's Chart, lab work & pertinent test results  Airway Mallampati: II  TM Distance: >3 FB Neck ROM: full    Dental no notable dental hx. (+) Dental Advisory Given, Teeth Intact   Pulmonary neg pulmonary ROS, former smoker,    Pulmonary exam normal breath sounds clear to auscultation       Cardiovascular Exercise Tolerance: Good negative cardio ROS   Rhythm:regular Rate:Normal     Neuro/Psych negative neurological ROS  negative psych ROS   GI/Hepatic negative GI ROS, Neg liver ROS, GERD  Medicated and Controlled,  Endo/Other  negative endocrine ROSMorbid obesity  Renal/GU negative Renal ROS  negative genitourinary   Musculoskeletal   Abdominal (+) + obese,   Peds  Hematology negative hematology ROS (+)   Anesthesia Other Findings   Reproductive/Obstetrics negative OB ROS                             Anesthesia Physical Anesthesia Plan  ASA: III  Anesthesia Plan: General   Post-op Pain Management:    Induction: Intravenous  Airway Management Planned: LMA  Additional Equipment:   Intra-op Plan:   Post-operative Plan:   Informed Consent: I have reviewed the patients History and Physical, chart, labs and discussed the procedure including the risks, benefits and alternatives for the proposed anesthesia with the patient or authorized representative who has indicated his/her understanding and acceptance.   Dental Advisory Given  Plan Discussed with: CRNA and Surgeon  Anesthesia Plan Comments:         Anesthesia Quick Evaluation

## 2015-09-01 NOTE — Op Note (Signed)
09/01/2015  12:33 PM  PATIENT:  Elizabeth Copeland  45 y.o. female  PRE-OPERATIVE DIAGNOSIS:  DUB, fibroids, morbid obesity, preventative care  POST-OPERATIVE DIAGNOSIS:  same  PROCEDURE:  Procedure(s): EXAM UNDER ANESTHESIA (N/A) DILATATION AND CURETTAGE (N/A)  SURGEON:  Surgeon(s) and Role:    * Emily Filbert, MD - Primary  ANESTHESIA:   general  EBL:   minimal  BLOOD ADMINISTERED:none  DRAINS: none   LOCAL MEDICATIONS USED:  NONE  SPECIMEN:  Source of Specimen:  uterine curettings and pap smear  DISPOSITION OF SPECIMEN:  PATHOLOGY  COUNTS:  YES  TOURNIQUET:  * No tourniquets in log *  DICTATION: .Dragon Dictation  PLAN OF CARE: Discharge to home after PACU  PATIENT DISPOSITION:  PACU - hemodynamically stable.   Delay start of Pharmacological VTE agent (>24hrs) due to surgical blood loss or risk of bleeding: not applicable    The risks, benefits, and alternatives of surgery were explained, understood, and accepted. All questions were answered. Consents were signed. In the operating room MAC anesthesia was applied without complication, and she was placed in the dorsal lithotomy position. Her vagina was prepped and draped in the usual sterile fashion.  A an extra longspeculum was placed in the vagina and a Deaver anteriorly. I was able to see her small cervix and I grasped it with a singe tooth tenaculum. In order to get the pap smear and biopsy, general anesthesia was given to provide patient relaxation.  I was then able to obtain a pap smear and then I used a Pipelle endometrial biopsy devise to get a small amount of endometrial tissue. She is currently having her period and her blood is very watery (hemoglobin of 7.5). I didn't feel that I had an adequate sample so I used a #6 flexible suction curette and applied suction to obtain more endometrium. There was no bleeding noted at the end of the case (other than menses)  She was taken to the recovery room after being extubated.  She tolerated the procedure well.

## 2015-09-01 NOTE — H&P (Signed)
Elizabeth Copeland is an 45 y.o. female here today for a d&c and pap smear. She needs these prior to having a [place] for treatment of her large fibroids and menorrhagia and anemia.   Her cervix is so much deviated anteriorly and cephalad that I do not have a speculum that will allow me to even visualize the cervix            Patient's last menstrual period was 09/01/2015.    Past Medical History  Diagnosis Date  . Back pain   . Morbid obesity ([place])   . Fibroid   . Pneumonia   . Headache   . Anemia   . Complication of anesthesia     patient has not received anesthesia before but states father and brother have a hard time waking up from anesthesia  . Family history of adverse reaction to anesthesia     mother "has a hard time waking up and moody"     Past Surgical History  Procedure Laterality Date  . Cesarean section  2000  . Wisdom tooth extraction      Family History  Problem Relation Age of Onset  . Hypertension    . Diabetes Father     Social History:  reports that she quit smoking about 13 years ago. Her smoking use included Cigarettes. She has a 7.5 pack-year smoking history. She has never used smokeless tobacco. She reports that she does not drink alcohol or use illicit drugs.  Allergies: No Known Allergies  Prescriptions prior to admission  Medication Sig Dispense Refill Last Dose  . gabapentin (NEURONTIN) 800 MG tablet 1 tab in the morning, 1 tab midday, and 2 tabs at bedtime. (Patient taking differently: Take 800-1,600 mg by mouth 3 (three) times daily. 1 tab in the morning, 1 tab midday, and 2 tabs at bedtime.) 360 tablet 3 08/31/2015 at Unknown time  . ibuprofen (ADVIL,MOTRIN) 200 MG tablet Take 400-600 mg by mouth every 6 (six) hours as needed for headache.     . megestrol (MEGACE) 40 MG tablet Take 2 tablets (80 mg total) by mouth 2 (two) times daily. 60 tablet 5 08/31/2015 at Unknown time  . misoprostol (CYTOTEC) 200 MCG tablet Take 3 pills by mouth the night  before d&c. (Patient taking differently: Take 600 mcg by mouth once. Take 3 pills by mouth the night before d&c.) 3 tablet 0 08/31/2015 at Unknown time  . omeprazole (PRILOSEC) 40 MG capsule Take 1 capsule (40 mg total) by mouth daily. 30 capsule 2 08/31/2015 at Unknown time  . Vitamin D, Ergocalciferol, (DRISDOL) 50000 units CAPS capsule Take 50,000 Units by mouth every 7 (seven) days. For 8 weeks total.     . sulfamethoxazole-trimethoprim (BACTRIM DS,SEPTRA DS) 800-160 MG tablet Take 1 tablet by mouth 2 (two) times daily. (Patient not taking: Reported on 08/24/2015) 20 tablet 0 Not Taking at Unknown time    ROS  Blood pressure 153/85, pulse 81, temperature 99.8 F (37.7 C), temperature source Oral, resp. rate 16, last menstrual period 09/01/2015, SpO2 99 %. Physical Exam  Heart- rrr Lungs- CTAB Abd- benign  Results for orders placed or performed during the hospital encounter of 09/01/15 (from the past 24 hour(s))  Pregnancy, urine     Status: None   Collection Time: 09/01/15 10:00 AM  Result Value Ref Range   Preg Test, Ur NEGATIVE NEGATIVE    No results found.  Assessment/Plan: Need for uterine sampling and pap smear Plan to do this under anesthesia  She  understands the risks of surgery, including, but not to infection, bleeding, DVTs, damage to bowel, bladder, ureters. She wishes to proceed.     Elizabeth Copeland C. 09/01/2015, 11:46 AM

## 2015-09-01 NOTE — Transfer of Care (Signed)
Immediate Anesthesia Transfer of Care Note  Patient: Elizabeth Copeland  Procedure(s) Performed: Procedure(s): EXAM UNDER ANESTHESIA (N/A) DILATATION AND CURETTAGE (N/A)  Patient Location: PACU  Anesthesia Type:General  Level of Consciousness: awake, alert  and oriented  Airway & Oxygen Therapy: Patient Spontanous Breathing and Patient connected to nasal cannula oxygen  Post-op Assessment: Report given to RN and Post -op Vital signs reviewed and stable  Post vital signs: Reviewed and stable  Last Vitals:  Filed Vitals:   09/01/15 1023  BP: 153/85  Pulse: 81  Temp: 37.7 C  Resp: 16    Last Pain:  Filed Vitals:   09/01/15 1254  PainSc: 7       Patients Stated Pain Goal: 3 ([id number])  Complications: No apparent anesthesia complications

## 2015-09-01 NOTE — Discharge Instructions (Signed)
Dilation and Curettage or Vacuum Curettage, Care After Refer to this sheet in the next few weeks. These instructions provide you with information on caring for yourself after your procedure. Your health care provider may also give you more specific instructions. Your treatment has been planned according to current medical practices, but problems sometimes occur. Call your health care provider if you have any problems or questions after your procedure. WHAT TO EXPECT AFTER THE PROCEDURE After your procedure, it is typical to have light cramping and bleeding. This may last for 2 days to 2 weeks after the procedure. HOME CARE INSTRUCTIONS   Do not drive for 24 hours.  Wait 1 week before returning to strenuous activities.  Take your temperature 2 times a day for 4 days and write it down. Provide these temperatures to your health care provider if you develop a fever.  Avoid long periods of standing.  Avoid heavy lifting, pushing, or pulling. Do not lift anything heavier than 10 pounds (4.5 kg).  Limit stair climbing to once or twice a day.  Take rest periods often.  You may resume your usual diet.  Drink enough fluids to keep your urine clear or pale yellow.  Your usual bowel function should return. If you have constipation, you may:  Take a mild laxative with permission from your health care provider.  Add fruit and bran to your diet.  Drink more fluids.  Take showers instead of baths until your health care provider gives you permission to take baths.  Do not go swimming or use a hot tub until your health care provider approves.  Try to have someone with you or available to you the first 24-48 hours, especially if you were given a general anesthetic.  Do not douche, use tampons, or have sex (intercourse) for 2 weeks after the procedure.  Only take over-the-counter or prescription medicines as directed by your health care provider. Do not take aspirin. It can cause  bleeding.  Follow up with your health care provider as directed. SEEK MEDICAL CARE IF:   You have increasing cramps or pain that is not relieved with medicine.  You have abdominal pain that does not seem to be related to the same area of earlier cramping and pain.  You have bad smelling vaginal discharge.  You have a rash.  You are having problems with any medicine. SEEK IMMEDIATE MEDICAL CARE IF:   You have bleeding that is heavier than a normal menstrual period.  You have a fever.  You have chest pain.  You have shortness of breath.  You feel dizzy or feel like fainting.  You pass out.  You have pain in your shoulder strap area.  You have heavy vaginal bleeding with or without blood clots. MAKE SURE YOU:   Understand these instructions.  Will watch your condition.  Will get help right away if you are not doing well or get worse.   This information is not intended to replace advice given to you by your health care provider. Make sure you discuss any questions you have with your health care provider.   Document Released: 03/18/2000 Document Revised: 03/26/2013 Document Reviewed: 10/18/2012 Elsevier Interactive Patient Education 2016 [place].   May remove scop patch behind left ear on or before 09/04/15.  Wash hands with soap and water after any contact with the patch.  Do not take ibuprofen/motrin/advil products until 6:40pm 09/01/15.    Patients signature: ______________________  Nurses signature ________________________  Support person's signature_______________________

## 2015-09-01 NOTE — Anesthesia Postprocedure Evaluation (Signed)
Anesthesia Post Note  Patient: Elizabeth Copeland  Procedure(s) Performed: Procedure(s) (LRB): EXAM UNDER ANESTHESIA (N/A) DILATATION AND CURETTAGE (N/A)  Patient location during evaluation: PACU Anesthesia Type: General Level of consciousness: awake and alert Pain management: pain level controlled Vital Signs Assessment: post-procedure vital signs reviewed and stable Respiratory status: spontaneous breathing, nonlabored ventilation, respiratory function stable and patient connected to nasal cannula oxygen Cardiovascular status: blood pressure returned to baseline and stable Postop Assessment: no signs of nausea or vomiting Anesthetic complications: no    Last Vitals:  Filed Vitals:   09/01/15 1415 09/01/15 1451  BP: 125/79 124/80  Pulse: 71 77  Temp: 36.8 C 36.9 C  Resp: 23 20    Last Pain:  Filed Vitals:   09/01/15 1504  PainSc: 4                  Peniel Biel L

## 2015-09-02 ENCOUNTER — Encounter (HOSPITAL_COMMUNITY): Payer: Self-pay | Admitting: Obstetrics & Gynecology

## 2015-09-02 LAB — CYTOLOGY - PAP

## 2015-09-04 ENCOUNTER — Other Ambulatory Visit: Payer: Self-pay | Admitting: Sports Medicine

## 2015-09-14 ENCOUNTER — Other Ambulatory Visit: Payer: Self-pay | Admitting: Diagnostic Radiology

## 2015-09-14 DIAGNOSIS — D259 Leiomyoma of uterus, unspecified: Secondary | ICD-10-CM

## 2015-10-14 ENCOUNTER — Ambulatory Visit (INDEPENDENT_AMBULATORY_CARE_PROVIDER_SITE_OTHER): Payer: BLUE CROSS/BLUE SHIELD

## 2015-10-14 ENCOUNTER — Ambulatory Visit (INDEPENDENT_AMBULATORY_CARE_PROVIDER_SITE_OTHER): Payer: BLUE CROSS/BLUE SHIELD | Admitting: Family Medicine

## 2015-10-14 ENCOUNTER — Encounter: Payer: Self-pay | Admitting: Family Medicine

## 2015-10-14 VITALS — BP 141/67 | HR 97 | Wt 225.0 lb

## 2015-10-14 DIAGNOSIS — Q6102 Congenital multiple renal cysts: Secondary | ICD-10-CM | POA: Diagnosis not present

## 2015-10-14 DIAGNOSIS — R1011 Right upper quadrant pain: Secondary | ICD-10-CM

## 2015-10-14 DIAGNOSIS — M5417 Radiculopathy, lumbosacral region: Secondary | ICD-10-CM

## 2015-10-14 DIAGNOSIS — K760 Fatty (change of) liver, not elsewhere classified: Secondary | ICD-10-CM

## 2015-10-14 DIAGNOSIS — M5416 Radiculopathy, lumbar region: Secondary | ICD-10-CM

## 2015-10-14 MED ORDER — PREDNISONE 50 MG PO TABS: ORAL_TABLET | ORAL | Status: DC

## 2015-10-14 NOTE — Progress Notes (Signed)
  Subjective:    CC: Recurrent back pain and radiculopathy  HPI: This is a pleasant 45 year old female, she has known lumbar degenerative disc disease, we treated her in the past with lumbar epidurals over a year ago as well as gabapentin, things are stable until recently. She's now having a recurrence of pain in the back, worse with sitting, flexion, Valsalva with radiation down the right leg in an L5 distribution to the ball of the foot. She tells me her previous epidural which was on the right at L5-S1 in interlaminar approach was ineffective. She tells me she desires a quick fix today.  Past medical history, Surgical history, Family history not pertinant except as noted below, Social history, Allergies, and medications have been entered into the medical record, reviewed, and no changes needed.   Review of Systems: No fevers, chills, night sweats, weight loss, chest pain, or shortness of breath.   Objective:    General: Well Developed, well nourished, and in no acute distress.  Neuro: Alert and oriented x3, extra-ocular muscles intact, sensation grossly intact.  HEENT: Normocephalic, atraumatic, pupils equal round reactive to light, neck supple, no masses, no lymphadenopathy, thyroid nonpalpable.  Skin: Warm and dry, no rashes. Cardiac: Regular rate and rhythm, no murmurs rubs or gallops, no lower extremity edema.  Respiratory: Clear to auscultation bilaterally. Not using accessory muscles, speaking in full sentences. Back Exam:  Inspection: Unremarkable  Motion: Flexion 45 deg, Extension 45 deg, Side Bending to 45 deg bilaterally,  Rotation to 45 deg bilaterally  SLR laying: Negative  XSLR laying: Negative  Palpable tenderness: None. FABER: negative. Sensory change: Gross sensation intact to all lumbar and sacral dermatomes.  Reflexes: 2+ at both patellar tendons, 2+ at achilles tendons, Babinski's downgoing.  Strength at foot  Plantar-flexion: 5/5 Dorsi-flexion: 5/5 Eversion: 5/5  Inversion: 5/5  Leg strength  Quad: 5/5 Hamstring: 5/5 Hip flexor: 5/5 Hip abductors: 5/5  Gait unremarkable.  MRI from 2015 shows that the right L5-S1 disc comes in close contact with the mid to extraforaminal L5 nerve root on the right. This has likely worsened over the past 2 years.  Impression and Recommendations:    I spent 25 minutes with this patient, greater than 50% was face-to-face time counseling regarding the above diagnoses

## 2015-10-14 NOTE — Progress Notes (Signed)
Subjective:    CC: Abd Pain  HPI:  45 yo female comes in complaining of Right upper quadrant abdominal pain, that occasionally radiates to the epigastric area.  Had a D & C 6 weeks ago.  She feels like it is a "rolling " pain under her breasts. Worse when she bends over or eats.She says even eating a small bowl of cereal will make her stomach hurt. Feels better when lying down or standing.  He Artie takes a PPI daily. She says that it almost feels like a very tight squeezing sensation. She has not been any nausea or vomiting.  No recent change in bowels. She was on iron previously but was causing some constipation so she did back off of that about a month ago when she had her D&C surgery. She still has a extremely large uterine fibroid and is actually planning on having a procedure soon to treat that. He has felt more thirsty.  BP 141/67 mmHg  Pulse 97  Wt 225 lb (102.059 kg)  SpO2 98%    No Known Allergies  Past Medical History  Diagnosis Date  . Back pain   . Morbid obesity ([place])   . Fibroid   . Pneumonia   . Headache   . Anemia   . Complication of anesthesia     patient has not received anesthesia before but states father and brother have a hard time waking up from anesthesia  . Family history of adverse reaction to anesthesia     mother "has a hard time waking up and moody"     Past Surgical History  Procedure Laterality Date  . Cesarean section  2000  . Wisdom tooth extraction    . Dilation and curettage of uterus N/A 09/01/2015    Procedure: DILATATION AND CURETTAGE;  Surgeon: Emily Filbert, MD;  Location: [facility];  Service: Gynecology;  Laterality: N/A;    Social History   Social History  . Marital Status: Single    Spouse Name: N/A  . Number of Children: 1  . Years of Education: 2 yr coll   Occupational History  . member [employer]   Social History Main Topics  . Smoking status: Former Smoker -- 0.50 packs/day for 15 years    Types: Cigarettes    Quit date: 04/20/2002  . Smokeless tobacco: Never Used  . Alcohol Use: No  . Drug Use: No  . Sexual Activity: Not Currently   Other Topics Concern  . Not on file   Social History Narrative   No regular exercise.  Lives with her mother [name] and father Hagan Cedar    Family History  Problem Relation Age of Onset  . Hypertension    . Diabetes Father     Outpatient Encounter Prescriptions as of 10/14/2015  Medication Sig  . Vitamin D, Ergocalciferol, (DRISDOL) 50000 units CAPS capsule TK ONE C PO Q 7 DAYS FOR 8 WKS TOTAL  . gabapentin (NEURONTIN) 800 MG tablet 1 tab in the morning, 1 tab midday, and 2 tabs at bedtime. (Patient taking differently: Take 800-1,600 mg by mouth 3 (three) times daily. 1 tab in the morning, 1 tab midday, and 2 tabs at bedtime.)  . ibuprofen (ADVIL,MOTRIN) 200 MG tablet Take 400-600 mg by mouth every 6 (six) hours as needed for headache.  Marland Kitchen omeprazole (PRILOSEC) 40 MG capsule Take 1 capsule (40 mg total) by mouth daily.  . [DISCONTINUED] megestrol (MEGACE) 40 MG tablet Take  2 tablets (80 mg total) by mouth 2 (two) times daily.  . [DISCONTINUED] oxyCODONE-acetaminophen (PERCOCET/ROXICET) 5-325 MG tablet Take 1-2 tablets by mouth every 6 (six) hours as needed.  . [DISCONTINUED] sulfamethoxazole-trimethoprim (BACTRIM DS,SEPTRA DS) 800-160 MG tablet Take 1 tablet by mouth 2 (two) times daily. (Patient not taking: Reported on 08/24/2015)  . [DISCONTINUED] Vitamin D, Ergocalciferol, (DRISDOL) 50000 units CAPS capsule Take 50,000 Units by mouth every 7 (seven) days. For 8 weeks total.  . [DISCONTINUED] Vitamin D, Ergocalciferol, (DRISDOL) 50000 units CAPS capsule TAKE ONE CAPSULE BY MOUTH EVERY 7 DAYS FOR 8 WEEKS TOTAL   No facility-administered encounter medications on file as of 10/14/2015.        Review of Systems: No fevers, chills, night sweats, weight loss, chest pain, or shortness of breath.   Objective:    General: Well  Developed, well nourished, and in no acute distress.  Neuro: Alert and oriented x3, extra-ocular muscles intact, sensation grossly intact.  HEENT: Normocephalic, atraumatic  Skin: Warm and dry, no rashes. Cardiac: Regular rate and rhythm, no murmurs rubs or gallops, no lower extremity edema.  Respiratory: Clear to auscultation bilaterally. Not using accessory muscles, speaking in full sentences. Abd: Soft, normal bowel sounds, tender in the right upper quadrant and in the right lower and left lower quadrants. No rebound or guarding.   Impression and Recommendations:   Right upper quadrant pain-recommend evaluate with ultrasound for possible cholecystitis or gallstones. His pain sounds consistent with gallbladder dysfunction. She is Artie on a PPI daily. Encouraged her to stay hydrated and eat small meals for now. Stay way from any foods that are higher content and fat such as dairy and more oily or greasy foods.

## 2015-10-14 NOTE — Assessment & Plan Note (Addendum)
Recurrence of back pain with right-sided radiculopathy, she had a right-sided L5-S1 interlaminar epidural in the distant past. Unfortunately she didn't have tremendous relief from this injection, pain is classic L5 distribution down to the plantar aspect of the foot. This time we are going to proceed with a right L5-S1 transforaminal epidural rather than interlaminar. Adding prednisone as well.

## 2015-11-03 ENCOUNTER — Other Ambulatory Visit: Payer: Self-pay | Admitting: Sports Medicine

## 2015-11-17 ENCOUNTER — Other Ambulatory Visit: Payer: Self-pay | Admitting: Radiology

## 2015-11-18 ENCOUNTER — Encounter (HOSPITAL_COMMUNITY): Payer: Self-pay

## 2015-11-18 ENCOUNTER — Observation Stay (HOSPITAL_COMMUNITY)
Admission: RE | Admit: 2015-11-18 | Discharge: 2015-11-19 | Disposition: A | Discharge status: Home / Self Care | Payer: BLUE CROSS/BLUE SHIELD | Source: Ambulatory Visit | Attending: Interventional Radiology | Admitting: Interventional Radiology

## 2015-11-18 ENCOUNTER — Ambulatory Visit (HOSPITAL_COMMUNITY)
Admission: RE | Admit: 2015-11-18 | Discharge: 2015-11-18 | Disposition: A | Discharge status: Home / Self Care | Payer: BLUE CROSS/BLUE SHIELD | Source: Ambulatory Visit | Attending: Diagnostic Radiology | Admitting: Diagnostic Radiology

## 2015-11-18 DIAGNOSIS — Z5181 Encounter for therapeutic drug level monitoring: Secondary | ICD-10-CM | POA: Insufficient documentation

## 2015-11-18 DIAGNOSIS — Z87891 Personal history of nicotine dependence: Secondary | ICD-10-CM | POA: Diagnosis not present

## 2015-11-18 DIAGNOSIS — D259 Leiomyoma of uterus, unspecified: Principal | ICD-10-CM | POA: Diagnosis present

## 2015-11-18 DIAGNOSIS — R102 Pelvic and perineal pain: Secondary | ICD-10-CM | POA: Diagnosis not present

## 2015-11-18 DIAGNOSIS — Z791 Long term (current) use of non-steroidal anti-inflammatories (NSAID): Secondary | ICD-10-CM | POA: Insufficient documentation

## 2015-11-18 HISTORY — PX: IR GENERIC HISTORICAL: IMG1180011

## 2015-11-18 LAB — CBC WITH DIFFERENTIAL/PLATELET
BASOS PCT: 1 %
Basophils Absolute: 0.1 10*3/uL (ref 0.0–0.1)
EOS ABS: 0.2 10*3/uL (ref 0.0–0.7)
Eosinophils Relative: 2 %
HCT: 25.6 % — ABNORMAL LOW (ref 36.0–46.0)
Hemoglobin: 6.6 g/dL — CL (ref 12.0–15.0)
LYMPHS ABS: 2.2 10*3/uL (ref 0.7–4.0)
Lymphocytes Relative: 26 %
MCH: 15.7 pg — AB (ref 26.0–34.0)
MCHC: 25.8 g/dL — ABNORMAL LOW (ref 30.0–36.0)
MCV: 60.8 fL — ABNORMAL LOW (ref 78.0–100.0)
MONO ABS: 0.6 10*3/uL (ref 0.1–1.0)
MONOS PCT: 7 %
Neutro Abs: 5.2 10*3/uL (ref 1.7–7.7)
Neutrophils Relative %: 64 %
PLATELETS: 531 10*3/uL — AB (ref 150–400)
RBC: 4.21 MIL/uL (ref 3.87–5.11)
RDW: 19 % — AB (ref 11.5–15.5)
WBC: 8.3 10*3/uL (ref 4.0–10.5)

## 2015-11-18 LAB — COMPREHENSIVE METABOLIC PANEL
ALBUMIN: 4.1 g/dL (ref 3.5–5.0)
ALT: 15 U/L (ref 14–54)
AST: 30 U/L (ref 15–41)
Alkaline Phosphatase: 65 U/L (ref 38–126)
Anion gap: 7 (ref 5–15)
BILIRUBIN TOTAL: 0.2 mg/dL — AB (ref 0.3–1.2)
BUN: 12 mg/dL (ref 6–20)
CO2: 23 mmol/L (ref 22–32)
Calcium: 9.1 mg/dL (ref 8.9–10.3)
Chloride: 106 mmol/L (ref 101–111)
Creatinine, Ser: 0.79 mg/dL (ref 0.44–1.00)
GFR calc Af Amer: >60 mL/min (ref >60)
GFR calc non Af Amer: >60 mL/min (ref >60)
GLUCOSE: 101 mg/dL — AB (ref 65–99)
POTASSIUM: 4.1 mmol/L (ref 3.5–5.1)
Sodium: 136 mmol/L (ref 135–145)
TOTAL PROTEIN: 7.4 g/dL (ref 6.5–8.1)

## 2015-11-18 LAB — HCG, SERUM, QUALITATIVE: PREG SERUM: NEGATIVE

## 2015-11-18 LAB — PROTIME-INR
INR: 0.91
PROTHROMBIN TIME: 12.3 s (ref 11.4–15.2)

## 2015-11-18 MED ORDER — IOPAMIDOL (ISOVUE-300) INJECTION 61%
100.0000 mL | Freq: Once | INTRAVENOUS | Status: AC | PRN
  Administered 2015-11-18: 50 mL via INTRAVENOUS

## 2015-11-18 MED ORDER — DIPHENHYDRAMINE HCL 50 MG/ML IJ SOLN
12.5000 mg | Freq: Four times a day (QID) | INTRAMUSCULAR | Status: DC | PRN
  Administered 2015-11-18: 12.5 mg via INTRAVENOUS
  Filled 2015-11-18: qty 1

## 2015-11-18 MED ORDER — SODIUM CHLORIDE 0.9 % IV SOLN
INTRAVENOUS | Status: DC
  Administered 2015-11-18: 08:00:00 via INTRAVENOUS

## 2015-11-18 MED ORDER — HYDROMORPHONE 1 MG/ML IV SOLN
INTRAVENOUS | Status: AC
  Filled 2015-11-18: qty 25

## 2015-11-18 MED ORDER — GABAPENTIN 400 MG PO CAPS: 1600.0000 mg | ORAL_CAPSULE | Freq: Every day | ORAL | Status: DC

## 2015-11-18 MED ORDER — CEFTRIAXONE SODIUM 2 G IJ SOLR: 2.0000 g | Freq: Once | INTRAMUSCULAR | Status: DC

## 2015-11-18 MED ORDER — HYDROCODONE-ACETAMINOPHEN 5-325 MG PO TABS
1.0000 | ORAL_TABLET | ORAL | Status: DC | PRN
  Administered 2015-11-19: 2 via ORAL
  Filled 2015-11-18: qty 2

## 2015-11-18 MED ORDER — HYDROMORPHONE HCL 2 MG/ML IJ SOLN
INTRAMUSCULAR | Status: AC
  Administered 2015-11-18: 1 mg
  Filled 2015-11-18: qty 2

## 2015-11-18 MED ORDER — GABAPENTIN 400 MG PO CAPS: 800.0000 mg | ORAL_CAPSULE | ORAL | Status: DC

## 2015-11-18 MED ORDER — DOCUSATE SODIUM 100 MG PO CAPS
100.0000 mg | ORAL_CAPSULE | Freq: Two times a day (BID) | ORAL | Status: DC
  Administered 2015-11-18 – 2015-11-19 (×2): 100 mg via ORAL
  Filled 2015-11-18 (×2): qty 1

## 2015-11-18 MED ORDER — DEXTROSE 5 % IV SOLN
2.0000 g | Freq: Once | INTRAVENOUS | Status: DC
  Administered 2015-11-18: 2 g via INTRAVENOUS
  Filled 2015-11-18: qty 2

## 2015-11-18 MED ORDER — NALOXONE HCL 0.4 MG/ML IJ SOLN: 0.4000 mg | INTRAMUSCULAR | Status: DC | PRN

## 2015-11-18 MED ORDER — ONDANSETRON HCL 4 MG/2ML IJ SOLN
4.0000 mg | Freq: Once | INTRAMUSCULAR | Status: AC
  Administered 2015-11-18: 4 mg via INTRAVENOUS

## 2015-11-18 MED ORDER — PROMETHAZINE HCL 25 MG PO TABS
25.0000 mg | ORAL_TABLET | Freq: Three times a day (TID) | ORAL | Status: DC | PRN
  Administered 2015-11-19: 25 mg via ORAL
  Filled 2015-11-18: qty 1

## 2015-11-18 MED ORDER — OLOPATADINE HCL 0.1 % OP SOLN
1.0000 [drp] | Freq: Every day | OPHTHALMIC | Status: DC
  Filled 2015-11-18: qty 5

## 2015-11-18 MED ORDER — DEXAMETHASONE SODIUM PHOSPHATE 10 MG/ML IJ SOLN
10.0000 mg | Freq: Once | INTRAMUSCULAR | Status: AC
  Administered 2015-11-18: 10 mg via INTRAVENOUS

## 2015-11-18 MED ORDER — LIDOCAINE HCL 1 % IJ SOLN
INTRAMUSCULAR | Status: AC | PRN
  Administered 2015-11-18: 5 mL

## 2015-11-18 MED ORDER — ONDANSETRON HCL 4 MG/2ML IJ SOLN
INTRAMUSCULAR | Status: AC
  Filled 2015-11-18: qty 2

## 2015-11-18 MED ORDER — HYDROMORPHONE 1 MG/ML IV SOLN: INTRAVENOUS | Status: DC

## 2015-11-18 MED ORDER — LIDOCAINE HCL 1 % IJ SOLN
INTRAMUSCULAR | Status: AC
  Filled 2015-11-18: qty 20

## 2015-11-18 MED ORDER — DIPHENHYDRAMINE HCL 50 MG/ML IJ SOLN: 12.5000 mg | Freq: Four times a day (QID) | INTRAMUSCULAR | Status: DC | PRN

## 2015-11-18 MED ORDER — SODIUM CHLORIDE 0.9 % IV SOLN
INTRAVENOUS | Status: AC
  Administered 2015-11-18: 15:00:00 via INTRAVENOUS

## 2015-11-18 MED ORDER — DEXAMETHASONE 4 MG PO TABS
8.0000 mg | ORAL_TABLET | Freq: Once | ORAL | Status: AC
  Administered 2015-11-18: 8 mg via ORAL
  Filled 2015-11-18: qty 2

## 2015-11-18 MED ORDER — SODIUM CHLORIDE 0.9% FLUSH: 3.0000 mL | INTRAVENOUS | Status: DC | PRN

## 2015-11-18 MED ORDER — KETOROLAC TROMETHAMINE 30 MG/ML IJ SOLN
30.0000 mg | INTRAMUSCULAR | Status: AC
  Administered 2015-11-18: 30 mg via INTRAVENOUS
  Filled 2015-11-18: qty 1

## 2015-11-18 MED ORDER — MIDAZOLAM HCL 2 MG/2ML IJ SOLN
INTRAMUSCULAR | Status: AC | PRN
  Administered 2015-11-18: 1 mg via INTRAVENOUS
  Administered 2015-11-18: 0.5 mg via INTRAVENOUS
  Administered 2015-11-18: 1 mg via INTRAVENOUS
  Administered 2015-11-18: 0.5 mg via INTRAVENOUS

## 2015-11-18 MED ORDER — NALOXONE HCL 0.4 MG/ML IJ SOLN
INTRAMUSCULAR | Status: AC
  Filled 2015-11-18: qty 1

## 2015-11-18 MED ORDER — PROMETHAZINE HCL 25 MG RE SUPP: 25.0000 mg | Freq: Three times a day (TID) | RECTAL | Status: DC | PRN

## 2015-11-18 MED ORDER — DIPHENHYDRAMINE HCL 12.5 MG/5ML PO ELIX
12.5000 mg | ORAL_SOLUTION | Freq: Four times a day (QID) | ORAL | Status: DC | PRN
  Filled 2015-11-18: qty 5

## 2015-11-18 MED ORDER — SODIUM CHLORIDE 0.9% FLUSH: 9.0000 mL | INTRAVENOUS | Status: DC | PRN

## 2015-11-18 MED ORDER — ONDANSETRON HCL 4 MG/2ML IJ SOLN
4.0000 mg | Freq: Four times a day (QID) | INTRAMUSCULAR | Status: DC | PRN
  Administered 2015-11-18: 4 mg via INTRAVENOUS
  Filled 2015-11-18: qty 2

## 2015-11-18 MED ORDER — MIDAZOLAM HCL 2 MG/2ML IJ SOLN
INTRAMUSCULAR | Status: AC
  Filled 2015-11-18: qty 4

## 2015-11-18 MED ORDER — SODIUM CHLORIDE 0.9 % IV SOLN
510.0000 mg | Freq: Once | INTRAVENOUS | Status: AC
  Administered 2015-11-18: 510 mg via INTRAVENOUS
  Filled 2015-11-18: qty 17

## 2015-11-18 MED ORDER — PREDNISONE 20 MG PO TABS
20.0000 mg | ORAL_TABLET | Freq: Every day | ORAL | Status: AC
  Administered 2015-11-19: 20 mg via ORAL
  Filled 2015-11-18: qty 1

## 2015-11-18 MED ORDER — KETOROLAC TROMETHAMINE 30 MG/ML IJ SOLN
30.0000 mg | Freq: Four times a day (QID) | INTRAMUSCULAR | Status: DC
  Administered 2015-11-18 – 2015-11-19 (×3): 30 mg via INTRAVENOUS
  Filled 2015-11-18 (×3): qty 1

## 2015-11-18 MED ORDER — ONDANSETRON HCL 4 MG/2ML IJ SOLN: 4.0000 mg | Freq: Four times a day (QID) | INTRAMUSCULAR | Status: DC | PRN

## 2015-11-18 MED ORDER — SODIUM CHLORIDE 0.9 % IV SOLN: 250.0000 mL | INTRAVENOUS | Status: DC | PRN

## 2015-11-18 MED ORDER — FLUMAZENIL 0.5 MG/5ML IV SOLN
INTRAVENOUS | Status: AC
  Filled 2015-11-18: qty 5

## 2015-11-18 MED ORDER — HYDROMORPHONE 1 MG/ML IV SOLN
INTRAVENOUS | Status: DC
  Administered 2015-11-18: 12:00:00 via INTRAVENOUS
  Administered 2015-11-18: 2.7 mg via INTRAVENOUS
  Administered 2015-11-18: 1.5 mg via INTRAVENOUS
  Administered 2015-11-19: 2.7 mg via INTRAVENOUS
  Administered 2015-11-19: 0.6 mg via INTRAVENOUS

## 2015-11-18 MED ORDER — SODIUM CHLORIDE 0.9% FLUSH
3.0000 mL | Freq: Two times a day (BID) | INTRAVENOUS | Status: DC
  Administered 2015-11-18: 3 mL via INTRAVENOUS

## 2015-11-18 MED ORDER — HYDROMORPHONE HCL 1 MG/ML IJ SOLN
INTRAMUSCULAR | Status: AC | PRN
  Administered 2015-11-18 (×4): 0.5 mg via INTRAVENOUS
  Administered 2015-11-18: 1 mg via INTRAVENOUS

## 2015-11-18 MED ORDER — PANTOPRAZOLE SODIUM 40 MG PO TBEC
40.0000 mg | DELAYED_RELEASE_TABLET | Freq: Every day | ORAL | Status: DC
  Administered 2015-11-19: 40 mg via ORAL
  Filled 2015-11-18: qty 1

## 2015-11-18 MED ORDER — HYDROMORPHONE HCL 1 MG/ML IJ SOLN: 1.0000 mg | Freq: Once | INTRAMUSCULAR | Status: DC

## 2015-11-18 MED ORDER — ENOXAPARIN SODIUM 40 MG/0.4ML ~~LOC~~ SOLN: 40.0000 mg | SUBCUTANEOUS | Status: DC

## 2015-11-18 MED ORDER — CEFAZOLIN SODIUM-DEXTROSE 2-4 GM/100ML-% IV SOLN
2.0000 g | INTRAVENOUS | Status: DC
  Filled 2015-11-18: qty 100

## 2015-11-18 MED ORDER — DEXAMETHASONE SODIUM PHOSPHATE 10 MG/ML IJ SOLN
INTRAMUSCULAR | Status: AC
  Filled 2015-11-18: qty 1

## 2015-11-18 MED ORDER — DIPHENHYDRAMINE HCL 12.5 MG/5ML PO ELIX: 12.5000 mg | ORAL_SOLUTION | Freq: Four times a day (QID) | ORAL | Status: DC | PRN

## 2015-11-18 NOTE — Progress Notes (Signed)
Patient ID: Elizabeth Copeland, female   DOB: [birth year], 45 y.o.   MRN: [id number]    Referring Physician(s): Dove,M  Supervising Physician: Jacqulynn Cadet  Patient Status:  Inpatient  Chief Complaint:  Symptomatic uterine fibroids  Subjective:  Patient status post bilateral uterine artery embolization earlier today. Now drowsy secondary to PCA Dilaudid with occasional pelvic cramping; denies nausea/ vomiting. Has had a few small sips of liquids. Feels a little flushed.  Allergies: Review of patient's allergies indicates no known allergies.  Medications: Prior to Admission medications   Medication Sig Start Date End Date Taking? Authorizing Provider  gabapentin (NEURONTIN) 800 MG tablet 1 tab in the morning, 1 tab midday, and 2 tabs at bedtime. Patient taking differently: Take 800-1,600 mg by mouth 3 (three) times daily. 1 tab in the morning, 1 tab midday, and 2 tabs at bedtime. 05/06/15  Yes Silverio Decamp, MD  ibuprofen (ADVIL,MOTRIN) 200 MG tablet Take 400-600 mg by mouth every 6 (six) hours as needed for headache.   Yes Historical Provider, MD  olopatadine (PATANOL) 0.1 % ophthalmic solution Place 1 drop into both eyes daily.    Yes Historical Provider, MD  omeprazole (PRILOSEC) 40 MG capsule Take 1 capsule (40 mg total) by mouth daily. Patient taking differently: Take 40 mg by mouth daily as needed (indigestion.).  01/03/14  Yes Hali Marry, MD  tiZANidine (ZANAFLEX) 2 MG tablet Take 2 mg by mouth daily as needed (migraine/cycle.).   Yes Historical Provider, MD  predniSONE (DELTASONE) 50 MG tablet One tab PO daily for 5 days. Patient not taking: Reported on 11/18/2015 10/14/15   Silverio Decamp, MD     Vital Signs: BP 137/81 (BP Location: Right Arm)   Pulse 90   Temp 97.7 F (36.5 C) (Oral)   Resp 16   Ht 5\' 3"  (1.6 m)   Wt 235 lb 3.7 oz (106.7 kg)   SpO2 98%   BMI 41.67 kg/m   Physical Exam awake, but drowsy. Abdomen soft, positive bowel sounds,  mild pelvic tenderness to palpation. Puncture site right common femoral artery soft, nontender, no hematoma. Intact distal pulses. Urine in Foley yellow.  Imaging: Ir Angiogram Pelvis Selective Or Supraselective  Result Date: 11/18/2015 INDICATION: 45 year old female with severe menorrhagia and chronic iron deficiency anemia in the setting of multiple uterine fibroids. She presents today for bilateral uterine artery embolization. EXAM: IR EMBO TUMOR ORGAN ISCHEMIA INFARCT INC GUIDE ROADMAPPING; IR ULTRASOUND GUIDANCE VASC ACCESS RIGHT; ADDITIONAL ARTERIOGRAPHY; PELVIC SELECTIVE ARTERIOGRAPHY 1. Ultrasound-guided puncture right common femoral artery 2. Catheterization of the left external iliac artery with arteriogram 3. Catheterization left internal iliac artery with arteriogram 4. Catheterization of the left uterine artery with arteriogram 5. Particle embolization left uterine artery to near stasis 6. Catheterization of the right internal iliac artery with arteriogram 7. Catheterization of the right uterine artery with arteriogram 8. Particle embolization of the right uterine artery to near stasis 9. Application of Cordis Exoseal extra arterial vascular plug for hemostasis MEDICATIONS: 2 g Rocephin. The antibiotic was administered within 1 hour of the procedure. Additionally, 10 mg Decadron, 4 mg Zofran and 1 mg Dilaudid were administered prior to the procedure. ANESTHESIA/SEDATION: Moderate (conscious) sedation was employed during this procedure. A total of Versed 3 mg and Dilaudid 4 mg was administered intravenously. Moderate Sedation Time: 51 minutes. The patient's level of consciousness and vital signs were monitored continuously by radiology nursing throughout the procedure under my direct supervision. CONTRAST:  75 mL Isovue 370 FLUOROSCOPY TIME:  Fluoroscopy Time: 8 minutes 36 seconds (1075 mGy). COMPLICATIONS: None immediate. PROCEDURE: Informed consent was obtained from the patient following  explanation of the procedure, risks, benefits and alternatives. The patient understands, agrees and consents for the procedure. All questions were addressed. A time out was performed prior to the initiation of the procedure. Maximal barrier sterile technique utilized including caps, mask, sterile gowns, sterile gloves, large sterile drape, hand hygiene, and Betadine prep. The right common femoral artery was interrogated with ultrasound and found to be widely patent. An image was obtained and stored for the medical record. Local anesthesia was attained by infiltration with 1% lidocaine. A small dermatotomy was made. Under real-time sonographic guidance, the vessel was punctured with a 21 gauge micropuncture needle. Using standard technique, the initial micro needle was exchanged over a 0.018 micro wire for a transitional 4 [place] micro sheath. The micro sheath was then exchanged over a 0.035 wire for a working 5 [place] vascular sheath. A C2 Cobra catheter was then advanced over a Bentson wire into the abdominal aorta. Using a glidewire, the C2 cobra catheter was advanced up and over the aortic bifurcation and into the and left iliac system. An arteriogram was performed confirming that the catheter was within the external iliac artery. The external iliac, proximal profunda and superficial femoral arteries are widely patent and unremarkable. The inferior epigastric and ascending ileal lumbar arteries are also within normal limits. The catheter was brought back and advanced into the internal iliac artery. An internal iliac arteriogram was performed. There is a robust left uterine artery with significant supplied to the fibroid uterus. A Renegade high flow micro catheter was advanced into the horizontal segment of the left uterine artery. An arteriogram was performed. This confirms hypervascularity and masslike tumor blush consistent with the known uterine fibroids. There is no evidence of a vesico vaginal branch or  other abnormal anatomy. Particle embolization was then performed to near stasis using 1 vial of 500 -700 embospheres and 1.6 vials of 700 - 900 embospheres. Post embolization contrast injection demonstrates near stasis with reflux of contrast material up the uterine artery. The micro catheter was removed. The C2 cobra catheter was then fashioned into a Waltman loop using a standard technique. The catheter was then used to select the right internal iliac artery. An internal iliac arteriogram was performed. The origin of the left uterine artery is clearly visible. The vessel slightly hyper trophic. Tumor blush corresponding with the known uterine fibroids again noted. Again, the Renegade high flow micro catheter was successfully advanced into the horizontal segments of the right uterine artery. An arteriogram was performed. There is a prominent vesicle vaginal branch proximally. The micro catheter was positions several cm beyond the origin of this blood vessel. Particle embolization was then performed using a combination of 1 vial of 500 -700 atmospheres and approximately 1 mL of 700 - 900 embospheres. Post embolization show near stasis within the uterine artery. The vesico vaginal branches are preserved. The catheters were removed. A limited right common femoral arteriogram was performed through the arterial sheath. The arterial puncture is well positioned between the femoral bifurcation and inferior epigastric artery. A Cordis Exoseal extra arterial vascular plug was then applied. Hemostasis was attained without issue. The patient tolerated the procedure very well. IMPRESSION: Successful bilateral uterine artery embolization. Signed, Criselda Peaches, MD [facility] Radiology Electronically Signed   By: Jacqulynn Cadet M.D.   On: 11/18/2015 12:59   Ir Angiogram Pelvis Selective Or  Supraselective  Result Date: 11/18/2015 INDICATION: 45 year old female with  severe menorrhagia and chronic iron deficiency anemia in the setting of multiple uterine fibroids. She presents today for bilateral uterine artery embolization. EXAM: IR EMBO TUMOR ORGAN ISCHEMIA INFARCT INC GUIDE ROADMAPPING; IR ULTRASOUND GUIDANCE VASC ACCESS RIGHT; ADDITIONAL ARTERIOGRAPHY; PELVIC SELECTIVE ARTERIOGRAPHY 1. Ultrasound-guided puncture right common femoral artery 2. Catheterization of the left external iliac artery with arteriogram 3. Catheterization left internal iliac artery with arteriogram 4. Catheterization of the left uterine artery with arteriogram 5. Particle embolization left uterine artery to near stasis 6. Catheterization of the right internal iliac artery with arteriogram 7. Catheterization of the right uterine artery with arteriogram 8. Particle embolization of the right uterine artery to near stasis 9. Application of Cordis Exoseal extra arterial vascular plug for hemostasis MEDICATIONS: 2 g Rocephin. The antibiotic was administered within 1 hour of the procedure. Additionally, 10 mg Decadron, 4 mg Zofran and 1 mg Dilaudid were administered prior to the procedure. ANESTHESIA/SEDATION: Moderate (conscious) sedation was employed during this procedure. A total of Versed 3 mg and Dilaudid 4 mg was administered intravenously. Moderate Sedation Time: 51 minutes. The patient's level of consciousness and vital signs were monitored continuously by radiology nursing throughout the procedure under my direct supervision. CONTRAST:  75 mL Isovue 370 FLUOROSCOPY TIME:  Fluoroscopy Time: 8 minutes 36 seconds (1075 mGy). COMPLICATIONS: None immediate. PROCEDURE: Informed consent was obtained from the patient following explanation of the procedure, risks, benefits and alternatives. The patient understands, agrees and consents for the procedure. All questions were addressed. A time out was performed prior to the initiation of the procedure. Maximal barrier sterile technique utilized including caps,  mask, sterile gowns, sterile gloves, large sterile drape, hand hygiene, and Betadine prep. The right common femoral artery was interrogated with ultrasound and found to be widely patent. An image was obtained and stored for the medical record. Local anesthesia was attained by infiltration with 1% lidocaine. A small dermatotomy was made. Under real-time sonographic guidance, the vessel was punctured with a 21 gauge micropuncture needle. Using standard technique, the initial micro needle was exchanged over a 0.018 micro wire for a transitional 4 [place] micro sheath. The micro sheath was then exchanged over a 0.035 wire for a working 5 [place] vascular sheath. A C2 Cobra catheter was then advanced over a Bentson wire into the abdominal aorta. Using a glidewire, the C2 cobra catheter was advanced up and over the aortic bifurcation and into the and left iliac system. An arteriogram was performed confirming that the catheter was within the external iliac artery. The external iliac, proximal profunda and superficial femoral arteries are widely patent and unremarkable. The inferior epigastric and ascending ileal lumbar arteries are also within normal limits. The catheter was brought back and advanced into the internal iliac artery. An internal iliac arteriogram was performed. There is a robust left uterine artery with significant supplied to the fibroid uterus. A Renegade high flow micro catheter was advanced into the horizontal segment of the left uterine artery. An arteriogram was performed. This confirms hypervascularity and masslike tumor blush consistent with the known uterine fibroids. There is no evidence of a vesico vaginal branch or other abnormal anatomy. Particle embolization was then performed to near stasis using 1 vial of 500 -700 embospheres and 1.6 vials of 700 - 900 embospheres. Post embolization contrast injection demonstrates near stasis with reflux of contrast material up the uterine artery. The micro  catheter was removed. The C2 cobra catheter was then fashioned into  a Waltman loop using a standard technique. The catheter was then used to select the right internal iliac artery. An internal iliac arteriogram was performed. The origin of the left uterine artery is clearly visible. The vessel slightly hyper trophic. Tumor blush corresponding with the known uterine fibroids again noted. Again, the Renegade high flow micro catheter was successfully advanced into the horizontal segments of the right uterine artery. An arteriogram was performed. There is a prominent vesicle vaginal branch proximally. The micro catheter was positions several cm beyond the origin of this blood vessel. Particle embolization was then performed using a combination of 1 vial of 500 -700 atmospheres and approximately 1 mL of 700 - 900 embospheres. Post embolization show near stasis within the uterine artery. The vesico vaginal branches are preserved. The catheters were removed. A limited right common femoral arteriogram was performed through the arterial sheath. The arterial puncture is well positioned between the femoral bifurcation and inferior epigastric artery. A Cordis Exoseal extra arterial vascular plug was then applied. Hemostasis was attained without issue. The patient tolerated the procedure very well. IMPRESSION: Successful bilateral uterine artery embolization. Signed, Criselda Peaches, MD [facility] Radiology Electronically Signed   By: Jacqulynn Cadet M.D.   On: 11/18/2015 12:59   Ir Angiogram Selective Each Additional Vessel  Result Date: 11/18/2015 INDICATION: 45 year old female with severe menorrhagia and chronic iron deficiency anemia in the setting of multiple uterine fibroids. She presents today for bilateral uterine artery embolization. EXAM: IR EMBO TUMOR ORGAN ISCHEMIA INFARCT INC GUIDE ROADMAPPING; IR ULTRASOUND GUIDANCE VASC ACCESS RIGHT; ADDITIONAL  ARTERIOGRAPHY; PELVIC SELECTIVE ARTERIOGRAPHY 1. Ultrasound-guided puncture right common femoral artery 2. Catheterization of the left external iliac artery with arteriogram 3. Catheterization left internal iliac artery with arteriogram 4. Catheterization of the left uterine artery with arteriogram 5. Particle embolization left uterine artery to near stasis 6. Catheterization of the right internal iliac artery with arteriogram 7. Catheterization of the right uterine artery with arteriogram 8. Particle embolization of the right uterine artery to near stasis 9. Application of Cordis Exoseal extra arterial vascular plug for hemostasis MEDICATIONS: 2 g Rocephin. The antibiotic was administered within 1 hour of the procedure. Additionally, 10 mg Decadron, 4 mg Zofran and 1 mg Dilaudid were administered prior to the procedure. ANESTHESIA/SEDATION: Moderate (conscious) sedation was employed during this procedure. A total of Versed 3 mg and Dilaudid 4 mg was administered intravenously. Moderate Sedation Time: 51 minutes. The patient's level of consciousness and vital signs were monitored continuously by radiology nursing throughout the procedure under my direct supervision. CONTRAST:  75 mL Isovue 370 FLUOROSCOPY TIME:  Fluoroscopy Time: 8 minutes 36 seconds (1075 mGy). COMPLICATIONS: None immediate. PROCEDURE: Informed consent was obtained from the patient following explanation of the procedure, risks, benefits and alternatives. The patient understands, agrees and consents for the procedure. All questions were addressed. A time out was performed prior to the initiation of the procedure. Maximal barrier sterile technique utilized including caps, mask, sterile gowns, sterile gloves, large sterile drape, hand hygiene, and Betadine prep. The right common femoral artery was interrogated with ultrasound and found to be widely patent. An image was obtained and stored for the medical record. Local anesthesia was attained by  infiltration with 1% lidocaine. A small dermatotomy was made. Under real-time sonographic guidance, the vessel was punctured with a 21 gauge micropuncture needle. Using standard technique, the initial micro needle was exchanged over a 0.018 micro wire for a transitional 4 [place] micro sheath. The micro  sheath was then exchanged over a 0.035 wire for a working 5 [place] vascular sheath. A C2 Cobra catheter was then advanced over a Bentson wire into the abdominal aorta. Using a glidewire, the C2 cobra catheter was advanced up and over the aortic bifurcation and into the and left iliac system. An arteriogram was performed confirming that the catheter was within the external iliac artery. The external iliac, proximal profunda and superficial femoral arteries are widely patent and unremarkable. The inferior epigastric and ascending ileal lumbar arteries are also within normal limits. The catheter was brought back and advanced into the internal iliac artery. An internal iliac arteriogram was performed. There is a robust left uterine artery with significant supplied to the fibroid uterus. A Renegade high flow micro catheter was advanced into the horizontal segment of the left uterine artery. An arteriogram was performed. This confirms hypervascularity and masslike tumor blush consistent with the known uterine fibroids. There is no evidence of a vesico vaginal branch or other abnormal anatomy. Particle embolization was then performed to near stasis using 1 vial of 500 -700 embospheres and 1.6 vials of 700 - 900 embospheres. Post embolization contrast injection demonstrates near stasis with reflux of contrast material up the uterine artery. The micro catheter was removed. The C2 cobra catheter was then fashioned into a Waltman loop using a standard technique. The catheter was then used to select the right internal iliac artery. An internal iliac arteriogram was performed. The origin of the left uterine artery is clearly  visible. The vessel slightly hyper trophic. Tumor blush corresponding with the known uterine fibroids again noted. Again, the Renegade high flow micro catheter was successfully advanced into the horizontal segments of the right uterine artery. An arteriogram was performed. There is a prominent vesicle vaginal branch proximally. The micro catheter was positions several cm beyond the origin of this blood vessel. Particle embolization was then performed using a combination of 1 vial of 500 -700 atmospheres and approximately 1 mL of 700 - 900 embospheres. Post embolization show near stasis within the uterine artery. The vesico vaginal branches are preserved. The catheters were removed. A limited right common femoral arteriogram was performed through the arterial sheath. The arterial puncture is well positioned between the femoral bifurcation and inferior epigastric artery. A Cordis Exoseal extra arterial vascular plug was then applied. Hemostasis was attained without issue. The patient tolerated the procedure very well. IMPRESSION: Successful bilateral uterine artery embolization. Signed, Criselda Peaches, MD [facility] Radiology Electronically Signed   By: Jacqulynn Cadet M.D.   On: 11/18/2015 12:59   Ir Angiogram Selective Each Additional Vessel  Result Date: 11/18/2015 INDICATION: 46 year old female with severe menorrhagia and chronic iron deficiency anemia in the setting of multiple uterine fibroids. She presents today for bilateral uterine artery embolization. EXAM: IR EMBO TUMOR ORGAN ISCHEMIA INFARCT INC GUIDE ROADMAPPING; IR ULTRASOUND GUIDANCE VASC ACCESS RIGHT; ADDITIONAL ARTERIOGRAPHY; PELVIC SELECTIVE ARTERIOGRAPHY 1. Ultrasound-guided puncture right common femoral artery 2. Catheterization of the left external iliac artery with arteriogram 3. Catheterization left internal iliac artery with arteriogram 4. Catheterization of the left uterine artery  with arteriogram 5. Particle embolization left uterine artery to near stasis 6. Catheterization of the right internal iliac artery with arteriogram 7. Catheterization of the right uterine artery with arteriogram 8. Particle embolization of the right uterine artery to near stasis 9. Application of Cordis Exoseal extra arterial vascular plug for hemostasis MEDICATIONS: 2 g Rocephin. The antibiotic was administered within 1 hour of the  procedure. Additionally, 10 mg Decadron, 4 mg Zofran and 1 mg Dilaudid were administered prior to the procedure. ANESTHESIA/SEDATION: Moderate (conscious) sedation was employed during this procedure. A total of Versed 3 mg and Dilaudid 4 mg was administered intravenously. Moderate Sedation Time: 51 minutes. The patient's level of consciousness and vital signs were monitored continuously by radiology nursing throughout the procedure under my direct supervision. CONTRAST:  75 mL Isovue 370 FLUOROSCOPY TIME:  Fluoroscopy Time: 8 minutes 36 seconds (1075 mGy). COMPLICATIONS: None immediate. PROCEDURE: Informed consent was obtained from the patient following explanation of the procedure, risks, benefits and alternatives. The patient understands, agrees and consents for the procedure. All questions were addressed. A time out was performed prior to the initiation of the procedure. Maximal barrier sterile technique utilized including caps, mask, sterile gowns, sterile gloves, large sterile drape, hand hygiene, and Betadine prep. The right common femoral artery was interrogated with ultrasound and found to be widely patent. An image was obtained and stored for the medical record. Local anesthesia was attained by infiltration with 1% lidocaine. A small dermatotomy was made. Under real-time sonographic guidance, the vessel was punctured with a 21 gauge micropuncture needle. Using standard technique, the initial micro needle was exchanged over a 0.018 micro wire for a transitional 4 [place] micro  sheath. The micro sheath was then exchanged over a 0.035 wire for a working 5 [place] vascular sheath. A C2 Cobra catheter was then advanced over a Bentson wire into the abdominal aorta. Using a glidewire, the C2 cobra catheter was advanced up and over the aortic bifurcation and into the and left iliac system. An arteriogram was performed confirming that the catheter was within the external iliac artery. The external iliac, proximal profunda and superficial femoral arteries are widely patent and unremarkable. The inferior epigastric and ascending ileal lumbar arteries are also within normal limits. The catheter was brought back and advanced into the internal iliac artery. An internal iliac arteriogram was performed. There is a robust left uterine artery with significant supplied to the fibroid uterus. A Renegade high flow micro catheter was advanced into the horizontal segment of the left uterine artery. An arteriogram was performed. This confirms hypervascularity and masslike tumor blush consistent with the known uterine fibroids. There is no evidence of a vesico vaginal branch or other abnormal anatomy. Particle embolization was then performed to near stasis using 1 vial of 500 -700 embospheres and 1.6 vials of 700 - 900 embospheres. Post embolization contrast injection demonstrates near stasis with reflux of contrast material up the uterine artery. The micro catheter was removed. The C2 cobra catheter was then fashioned into a Waltman loop using a standard technique. The catheter was then used to select the right internal iliac artery. An internal iliac arteriogram was performed. The origin of the left uterine artery is clearly visible. The vessel slightly hyper trophic. Tumor blush corresponding with the known uterine fibroids again noted. Again, the Renegade high flow micro catheter was successfully advanced into the horizontal segments of the right uterine artery. An arteriogram was performed. There is a  prominent vesicle vaginal branch proximally. The micro catheter was positions several cm beyond the origin of this blood vessel. Particle embolization was then performed using a combination of 1 vial of 500 -700 atmospheres and approximately 1 mL of 700 - 900 embospheres. Post embolization show near stasis within the uterine artery. The vesico vaginal branches are preserved. The catheters were removed. A limited right common femoral arteriogram was performed through the arterial sheath.  The arterial puncture is well positioned between the femoral bifurcation and inferior epigastric artery. A Cordis Exoseal extra arterial vascular plug was then applied. Hemostasis was attained without issue. The patient tolerated the procedure very well. IMPRESSION: Successful bilateral uterine artery embolization. Signed, Criselda Peaches, MD [facility] Radiology Electronically Signed   By: Jacqulynn Cadet M.D.   On: 11/18/2015 12:59   Ir US Guide Vasc Access Right  Result Date: 11/18/2015 INDICATION: 45 year old female with severe menorrhagia and chronic iron deficiency anemia in the setting of multiple uterine fibroids. She presents today for bilateral uterine artery embolization. EXAM: IR EMBO TUMOR ORGAN ISCHEMIA INFARCT INC GUIDE ROADMAPPING; IR ULTRASOUND GUIDANCE VASC ACCESS RIGHT; ADDITIONAL ARTERIOGRAPHY; PELVIC SELECTIVE ARTERIOGRAPHY 1. Ultrasound-guided puncture right common femoral artery 2. Catheterization of the left external iliac artery with arteriogram 3. Catheterization left internal iliac artery with arteriogram 4. Catheterization of the left uterine artery with arteriogram 5. Particle embolization left uterine artery to near stasis 6. Catheterization of the right internal iliac artery with arteriogram 7. Catheterization of the right uterine artery with arteriogram 8. Particle embolization of the right uterine artery to near stasis 9. Application of  Cordis Exoseal extra arterial vascular plug for hemostasis MEDICATIONS: 2 g Rocephin. The antibiotic was administered within 1 hour of the procedure. Additionally, 10 mg Decadron, 4 mg Zofran and 1 mg Dilaudid were administered prior to the procedure. ANESTHESIA/SEDATION: Moderate (conscious) sedation was employed during this procedure. A total of Versed 3 mg and Dilaudid 4 mg was administered intravenously. Moderate Sedation Time: 51 minutes. The patient's level of consciousness and vital signs were monitored continuously by radiology nursing throughout the procedure under my direct supervision. CONTRAST:  75 mL Isovue 370 FLUOROSCOPY TIME:  Fluoroscopy Time: 8 minutes 36 seconds (1075 mGy). COMPLICATIONS: None immediate. PROCEDURE: Informed consent was obtained from the patient following explanation of the procedure, risks, benefits and alternatives. The patient understands, agrees and consents for the procedure. All questions were addressed. A time out was performed prior to the initiation of the procedure. Maximal barrier sterile technique utilized including caps, mask, sterile gowns, sterile gloves, large sterile drape, hand hygiene, and Betadine prep. The right common femoral artery was interrogated with ultrasound and found to be widely patent. An image was obtained and stored for the medical record. Local anesthesia was attained by infiltration with 1% lidocaine. A small dermatotomy was made. Under real-time sonographic guidance, the vessel was punctured with a 21 gauge micropuncture needle. Using standard technique, the initial micro needle was exchanged over a 0.018 micro wire for a transitional 4 [place] micro sheath. The micro sheath was then exchanged over a 0.035 wire for a working 5 [place] vascular sheath. A C2 Cobra catheter was then advanced over a Bentson wire into the abdominal aorta. Using a glidewire, the C2 cobra catheter was advanced up and over the aortic bifurcation and into the and left  iliac system. An arteriogram was performed confirming that the catheter was within the external iliac artery. The external iliac, proximal profunda and superficial femoral arteries are widely patent and unremarkable. The inferior epigastric and ascending ileal lumbar arteries are also within normal limits. The catheter was brought back and advanced into the internal iliac artery. An internal iliac arteriogram was performed. There is a robust left uterine artery with significant supplied to the fibroid uterus. A Renegade high flow micro catheter was advanced into the horizontal segment of the left uterine artery. An arteriogram was performed. This confirms hypervascularity and  masslike tumor blush consistent with the known uterine fibroids. There is no evidence of a vesico vaginal branch or other abnormal anatomy. Particle embolization was then performed to near stasis using 1 vial of 500 -700 embospheres and 1.6 vials of 700 - 900 embospheres. Post embolization contrast injection demonstrates near stasis with reflux of contrast material up the uterine artery. The micro catheter was removed. The C2 cobra catheter was then fashioned into a Waltman loop using a standard technique. The catheter was then used to select the right internal iliac artery. An internal iliac arteriogram was performed. The origin of the left uterine artery is clearly visible. The vessel slightly hyper trophic. Tumor blush corresponding with the known uterine fibroids again noted. Again, the Renegade high flow micro catheter was successfully advanced into the horizontal segments of the right uterine artery. An arteriogram was performed. There is a prominent vesicle vaginal branch proximally. The micro catheter was positions several cm beyond the origin of this blood vessel. Particle embolization was then performed using a combination of 1 vial of 500 -700 atmospheres and approximately 1 mL of 700 - 900 embospheres. Post embolization show near  stasis within the uterine artery. The vesico vaginal branches are preserved. The catheters were removed. A limited right common femoral arteriogram was performed through the arterial sheath. The arterial puncture is well positioned between the femoral bifurcation and inferior epigastric artery. A Cordis Exoseal extra arterial vascular plug was then applied. Hemostasis was attained without issue. The patient tolerated the procedure very well. IMPRESSION: Successful bilateral uterine artery embolization. Signed, Criselda Peaches, MD [facility] Radiology Electronically Signed   By: Jacqulynn Cadet M.D.   On: 11/18/2015 12:59   Ir Embo Tumor Organ Ischemia Infarct Inc Guide Roadmapping  Result Date: 11/18/2015 INDICATION: 45 year old female with severe menorrhagia and chronic iron deficiency anemia in the setting of multiple uterine fibroids. She presents today for bilateral uterine artery embolization. EXAM: IR EMBO TUMOR ORGAN ISCHEMIA INFARCT INC GUIDE ROADMAPPING; IR ULTRASOUND GUIDANCE VASC ACCESS RIGHT; ADDITIONAL ARTERIOGRAPHY; PELVIC SELECTIVE ARTERIOGRAPHY 1. Ultrasound-guided puncture right common femoral artery 2. Catheterization of the left external iliac artery with arteriogram 3. Catheterization left internal iliac artery with arteriogram 4. Catheterization of the left uterine artery with arteriogram 5. Particle embolization left uterine artery to near stasis 6. Catheterization of the right internal iliac artery with arteriogram 7. Catheterization of the right uterine artery with arteriogram 8. Particle embolization of the right uterine artery to near stasis 9. Application of Cordis Exoseal extra arterial vascular plug for hemostasis MEDICATIONS: 2 g Rocephin. The antibiotic was administered within 1 hour of the procedure. Additionally, 10 mg Decadron, 4 mg Zofran and 1 mg Dilaudid were administered prior to the procedure. ANESTHESIA/SEDATION:  Moderate (conscious) sedation was employed during this procedure. A total of Versed 3 mg and Dilaudid 4 mg was administered intravenously. Moderate Sedation Time: 51 minutes. The patient's level of consciousness and vital signs were monitored continuously by radiology nursing throughout the procedure under my direct supervision. CONTRAST:  75 mL Isovue 370 FLUOROSCOPY TIME:  Fluoroscopy Time: 8 minutes 36 seconds (1075 mGy). COMPLICATIONS: None immediate. PROCEDURE: Informed consent was obtained from the patient following explanation of the procedure, risks, benefits and alternatives. The patient understands, agrees and consents for the procedure. All questions were addressed. A time out was performed prior to the initiation of the procedure. Maximal barrier sterile technique utilized including caps, mask, sterile gowns, sterile gloves, large sterile drape, hand hygiene, and Betadine  prep. The right common femoral artery was interrogated with ultrasound and found to be widely patent. An image was obtained and stored for the medical record. Local anesthesia was attained by infiltration with 1% lidocaine. A small dermatotomy was made. Under real-time sonographic guidance, the vessel was punctured with a 21 gauge micropuncture needle. Using standard technique, the initial micro needle was exchanged over a 0.018 micro wire for a transitional 4 [place] micro sheath. The micro sheath was then exchanged over a 0.035 wire for a working 5 [place] vascular sheath. A C2 Cobra catheter was then advanced over a Bentson wire into the abdominal aorta. Using a glidewire, the C2 cobra catheter was advanced up and over the aortic bifurcation and into the and left iliac system. An arteriogram was performed confirming that the catheter was within the external iliac artery. The external iliac, proximal profunda and superficial femoral arteries are widely patent and unremarkable. The inferior epigastric and ascending ileal lumbar arteries  are also within normal limits. The catheter was brought back and advanced into the internal iliac artery. An internal iliac arteriogram was performed. There is a robust left uterine artery with significant supplied to the fibroid uterus. A Renegade high flow micro catheter was advanced into the horizontal segment of the left uterine artery. An arteriogram was performed. This confirms hypervascularity and masslike tumor blush consistent with the known uterine fibroids. There is no evidence of a vesico vaginal branch or other abnormal anatomy. Particle embolization was then performed to near stasis using 1 vial of 500 -700 embospheres and 1.6 vials of 700 - 900 embospheres. Post embolization contrast injection demonstrates near stasis with reflux of contrast material up the uterine artery. The micro catheter was removed. The C2 cobra catheter was then fashioned into a Waltman loop using a standard technique. The catheter was then used to select the right internal iliac artery. An internal iliac arteriogram was performed. The origin of the left uterine artery is clearly visible. The vessel slightly hyper trophic. Tumor blush corresponding with the known uterine fibroids again noted. Again, the Renegade high flow micro catheter was successfully advanced into the horizontal segments of the right uterine artery. An arteriogram was performed. There is a prominent vesicle vaginal branch proximally. The micro catheter was positions several cm beyond the origin of this blood vessel. Particle embolization was then performed using a combination of 1 vial of 500 -700 atmospheres and approximately 1 mL of 700 - 900 embospheres. Post embolization show near stasis within the uterine artery. The vesico vaginal branches are preserved. The catheters were removed. A limited right common femoral arteriogram was performed through the arterial sheath. The arterial puncture is well positioned between the femoral bifurcation and inferior  epigastric artery. A Cordis Exoseal extra arterial vascular plug was then applied. Hemostasis was attained without issue. The patient tolerated the procedure very well. IMPRESSION: Successful bilateral uterine artery embolization. Signed, Criselda Peaches, MD [facility] Radiology Electronically Signed   By: Jacqulynn Cadet M.D.   On: 11/18/2015 12:59    Labs:  CBC:  Recent Labs  05/06/15 1021 06/17/15 0949 08/26/15 1050 11/18/15 0745  WBC 9.7 8.1 10.3 8.3  HGB 6.6* 7.1* 7.5* 6.6*  HCT 25.2* 26.6* 28.6* 25.6*  PLT 474* 514* 438* 531*    COAGS:  Recent Labs  11/18/15 0745  INR 0.91    BMP:  Recent Labs  05/06/15 1021 11/18/15 0745  NA 139 136  K 4.4 4.1  CL 104 106  CO2 23 23  GLUCOSE 82 101*  BUN 9 12  CALCIUM 8.9 9.1  CREATININE 0.77 0.79  GFRNONAA  --  >60  GFRAA  --  >60    LIVER FUNCTION TESTS:  Recent Labs  05/06/15 1021 11/18/15 0745  BILITOT 0.5 0.2*  AST 19 30  ALT 12 15  ALKPHOS 53 65  PROT 6.8 7.4  ALBUMIN 4.1 4.1    Assessment and Plan: Symptomatic uterine fibroids, status post bilateral uterine artery embolization earlier today. For overnight obs; PCA Dilaudid for pain, Zofran as needed for nausea; advanced diet as tolerated; DC Foley later this evening; IR clinic follow-up with Dr. Laurence Ferrari in 2-4 weeks.   Electronically Signed: D. Rowe Robert 11/18/2015, 3:12 PM   I spent a total of 15 minutes at the the patient's bedside AND on the patient's hospital floor or unit, greater than 50% of which was counseling/coordinating care for bilateral uterine artery embolization

## 2015-11-18 NOTE — Procedures (Signed)
Interventional Radiology Procedure Note  Procedure: Bilateral uterine artery embolization  Vascular Access:  Right CFA, 51F --> ExoSeal  Complications: None  Estimated Blood Loss: None  Recommendations: - Bedrest x 4 hrs  - ADAT - Hydration - Pain control  Signed,  Criselda Peaches, MD

## 2015-11-18 NOTE — H&P (Signed)
Patient ID: Elizabeth Copeland, female   DOB: 04/24/1970, 45 y.o.   MRN: [id number]    Referring Physician(s): Dove,M  Supervising Physician: Jacqulynn Cadet  Patient Status:  Outpatient TBA  Chief Complaint:  Symptomatic uterine fibroids  Subjective: Patient familiar to IR service from prior consultation with Dr. Anselm Pancoast on 07/08/15 to discuss treatment options for symptomatic uterine fibroids. She was deemed an appropriate candidate for bilateral uterine artery embolization and presents today for the procedure.  Please refer to his consult note for additional details of medical history. Patient currently denies fever, chest pain, dyspnea, hematuria/dysuria or vomiting. She does have occ headaches, occasional cough, abdominal and back pain with RLE radiculopathy, menorrhagia with pelvic cramping, urinary frequency, bloating, constipation , occ hemorrhoidal bleeding  as well as occasional nausea. She is anxious.  Past Medical History:  Diagnosis Date  . Anemia   . Back pain   . Complication of anesthesia    patient has not received anesthesia before but states father and brother have a hard time waking up from anesthesia  . Family history of adverse reaction to anesthesia    mother "has a hard time waking up and moody"   . Fibroid   . Headache   . Morbid obesity ([place])   . Pneumonia    Past Surgical History:  Procedure Laterality Date  . CESAREAN SECTION  2000  . DILATION AND CURETTAGE OF UTERUS N/A 09/01/2015   Procedure: DILATATION AND CURETTAGE;  Surgeon: Emily Filbert, MD;  Location: [facility];  Service: Gynecology;  Laterality: N/A;  . WISDOM TOOTH EXTRACTION        Allergies: Review of patient's allergies indicates no known allergies.  Medications: Prior to Admission medications   Medication Sig Start Date End Date Taking? Authorizing Provider  gabapentin (NEURONTIN) 800 MG tablet 1 tab in the morning, 1 tab midday, and 2 tabs at bedtime. Patient taking differently: Take  800-1,600 mg by mouth 3 (three) times daily. 1 tab in the morning, 1 tab midday, and 2 tabs at bedtime. 05/06/15  Yes Silverio Decamp, MD  olopatadine (PATANOL) 0.1 % ophthalmic solution Place 1 drop into both eyes 1 day or 1 dose.   Yes Historical Provider, MD  predniSONE (DELTASONE) 50 MG tablet One tab PO daily for 5 days. 10/14/15  Yes Silverio Decamp, MD  Vitamin D, Ergocalciferol, (DRISDOL) 50000 units CAPS capsule TK ONE C PO Q 7 DAYS FOR 8 WKS TOTAL 10/06/15  Yes Historical Provider, MD  ibuprofen (ADVIL,MOTRIN) 200 MG tablet Take 400-600 mg by mouth every 6 (six) hours as needed for headache.    Historical Provider, MD  omeprazole (PRILOSEC) 40 MG capsule Take 1 capsule (40 mg total) by mouth daily. 01/03/14   Hali Marry, MD     Vital Signs: BP 139/70 (BP Location: Right Arm)   Pulse 96   Temp 98.9 F (37.2 C) (Oral)   Resp 16   Wt 229 lb 6.4 oz (104.1 kg)   SpO2 99%   BMI 40.64 kg/m   Physical Exam patient awake, alert. Chest clear to auscultation bilaterally. Heart with regular rate and rhythm. Abdomen obese, soft, generalized tenderness to palpation but slightly more so in pelvic regions at this time; extremities with full range of motion, intact distal pulses  Imaging: No results found.  Labs:  CBC:  Recent Labs  05/06/15 1021 06/17/15 0949 08/26/15 1050 11/18/15 0745  WBC 9.7 8.1 10.3 8.3  HGB 6.6* 7.1* 7.5* 6.6*  HCT 25.2* 26.6*  28.6* 25.6*  PLT 474* 514* 438* 531*    COAGS:  Recent Labs  11/18/15 0745  INR 0.91    BMP:  Recent Labs  05/06/15 1021 11/18/15 0745  NA 139 136  K 4.4 4.1  CL 104 106  CO2 23 23  GLUCOSE 82 101*  BUN 9 12  CALCIUM 8.9 9.1  CREATININE 0.77 0.79  GFRNONAA  --  >60  GFRAA  --  >60    LIVER FUNCTION TESTS:  Recent Labs  05/06/15 1021 11/18/15 0745  BILITOT 0.5 0.2*  AST 19 30  ALT 12 15  ALKPHOS 53 65  PROT 6.8 7.4  ALBUMIN 4.1 4.1    Assessment and Plan: Patient with history of  anemia, symptomatic uterine fibroids; seen in consultation by Dr. Anselm Pancoast on 07/08/15 and deemed an appropriate candidate for bilateral uterine artery embolization. She presents today for the above procedure. Today's labs reveal hemoglobin of 6.6-she was originally on iron but has not been taking secondary to constipation/occ nausea. Details/risks of procedure, including but not limited to, internal bleeding, infection, contrast nephropathy, nontarget embolization, tissue expulsion d/w pt/family with their understanding and consent. Following the procedure she will be noted for overnight observation.   Electronically Signed: D. Rowe Robert 11/18/2015, 9:16 AM   I spent a total of 30 minutes at the the patient's bedside AND on the patient's hospital floor or unit, greater than 50% of which was counseling/coordinating care for bilateral uterine artery embolization

## 2015-11-18 NOTE — Sedation Documentation (Signed)
Pre-procedure pulses right side: DP +4, PT - doppler Post-procedure pulses right side: DP +4, PT - doppler

## 2015-11-18 NOTE — Progress Notes (Signed)
CRITICAL VALUE ALERT  Critical value received:  Hgb 6.6  Date of notification:  11/18/15  Time of notification:  08:30  Critical value read back Yes  Nurse who received alert:  Lavon Paganini, RN  MD notified (1st page):  Rowe Robert, PA  Time of first page:  08:31  MD notified (2nd page):  Time of second page:  Responding MD:  Rowe Robert, PA   Time MD responded:  08:31

## 2015-11-19 ENCOUNTER — Other Ambulatory Visit: Payer: Self-pay | Admitting: Radiology

## 2015-11-19 DIAGNOSIS — Z791 Long term (current) use of non-steroidal anti-inflammatories (NSAID): Secondary | ICD-10-CM | POA: Diagnosis not present

## 2015-11-19 DIAGNOSIS — D259 Leiomyoma of uterus, unspecified: Secondary | ICD-10-CM | POA: Diagnosis not present

## 2015-11-19 DIAGNOSIS — Z5181 Encounter for therapeutic drug level monitoring: Secondary | ICD-10-CM | POA: Diagnosis not present

## 2015-11-19 DIAGNOSIS — R102 Pelvic and perineal pain: Secondary | ICD-10-CM | POA: Diagnosis not present

## 2015-11-19 DIAGNOSIS — Z87891 Personal history of nicotine dependence: Secondary | ICD-10-CM | POA: Diagnosis not present

## 2015-11-19 NOTE — Progress Notes (Signed)
Nursing Discharge Summary  Patient ID: Xena Oman MRN: [id number] DOB/AGE: 1970-12-21 45 y.o.  Admit date: 11/18/2015 Discharge date: 11/19/2015  Discharged Condition: good  Disposition: 01-Home or Self Care  Follow-up Information    Jacqulynn Cadet, MD .   Specialties:  Interventional Radiology, Radiology Why:  Radiology will call you with follow up appointment with Dr. Laurence Ferrari or Dr. Anselm Pancoast in [facility] in 2 weeks; call [phone] or [phone] with any questions Contact information: [address] [place] [address] [phone]        Emily Filbert, MD .   Specialty:  Obstetrics and Gynecology Why:  continue current gynecologic care with Dr. Hulan Fray as scheduled Contact information: [place] [address] [phone]           Prescriptions Given: Prescriptions for Vicodin, Ibuprofen, Zofran, Prednisone, and Colace was given. Follow up appointments and medications discussed.  Patient and family all verbalized understanding.   Means of Discharge: Patient taken downstairs via wheelchair to be discharged home via private vehicle.   Signed: Buel Ream 11/19/2015, 12:30 PM

## 2015-11-19 NOTE — Discharge Summary (Signed)
Physician Discharge Summary  Patient ID: Elizabeth Copeland MRN: [id number] DOB/AGE: 07-Apr-1970 45 y.o.  Admit date: 11/18/2015 Discharge date: 11/19/2015  Admission Diagnoses:Symptomatic uterine fibroids  Discharge Diagnoses: Symptomatic uterine fibroids, status post successful bilateral uterine artery embolization on 11/18/15 Active Problems:   Uterine fibroid with menorrhagia and anemia  Past Medical History:  Diagnosis Date  . Anemia   . Back pain   . Complication of anesthesia    patient has not received anesthesia before but states father and brother have a hard time waking up from anesthesia  . Family history of adverse reaction to anesthesia    mother "has a hard time waking up and moody"   . Fibroid   . Headache   . Morbid obesity ([place])   . Pneumonia    Past Surgical History:  Procedure Laterality Date  . CESAREAN SECTION  2000  . DILATION AND CURETTAGE OF UTERUS N/A 09/01/2015   Procedure: DILATATION AND CURETTAGE;  Surgeon: Emily Filbert, MD;  Location: [facility];  Service: Gynecology;  Laterality: N/A;  . IR GENERIC HISTORICAL  11/18/2015   IR US GUIDE VASC ACCESS RIGHT 11/18/2015 [facility]  . IR GENERIC HISTORICAL  11/18/2015   IR ANGIOGRAM SELECTIVE EACH ADDITIONAL VESSEL 11/18/2015 [facility]  . IR GENERIC HISTORICAL  11/18/2015   IR EMBO TUMOR ORGAN ISCHEMIA INFARCT INC GUIDE ROADMAPPING 11/18/2015 [facility]  . IR GENERIC HISTORICAL  11/18/2015   IR ANGIOGRAM PELVIS SELECTIVE OR SUPRASELECTIVE 11/18/2015 [facility]  . IR GENERIC HISTORICAL  11/18/2015   IR ANGIOGRAM SELECTIVE EACH ADDITIONAL VESSEL 11/18/2015 [facility]  . IR GENERIC HISTORICAL  11/18/2015   IR ANGIOGRAM PELVIS SELECTIVE OR SUPRASELECTIVE 11/18/2015 [facility]  . WISDOM TOOTH EXTRACTION        Discharged Condition: good  Hospital Course: Elizabeth Copeland is a 45 year old female who was referred to the IR service on 07/08/15 for consultation regarding treatment options for symptomatic uterine  fibroids. She was deemed an appropriate candidate for bilateral uterine artery embolization on 11/18/15 she underwent successful bilateral uterine artery embolization by Dr. Laurence Ferrari. The procedure was performed without immediate complications and she was subsequently admitted for overnight observation for pain control. Due to noted hemoglobin of 6.6 preprocedure she was given IV Fearheme while in-house. Postprocedure she did experience expected intermittent pelvic cramping along with some occasional nausea. She was placed on a Dilaudid PCA pump and given antiemetics as needed. On the day of discharge the patient was stable. She was able to void, ambulate and tolerate her diet without significant difficulty. She did continue to have some intermittent pelvic cramping along with some vaginal spotting and was told this would persist but should gradually improve over time. She was given prescriptions for ibuprofen 600 mg, #20, no refills, 1 tablet every 6 hours for the next 5 days, Colace 100 mg, #20, no refills, 1 tablet twice daily as needed for constipation, Norco 5/325, #30, no refills, 1-2 tablets every 4-6 hours as needed for moderate to severe pain, Zofran 8 mg, #20, no refills, 1 tablet twice daily as needed for nausea, prednisone taper 10 mg tablets, #15, no refills, 5 tablets on day one, 4 tablets day 2, 3 tablets day 3, 2 tablets daily day 4 and 1 tablet daily 5. She will be scheduled for follow-up in the IR clinic in 2 weeks. She was told to continue her current home medications including iron supplementation. She will continue current gynecologic care with Dr. Hulan Fray. She was told to contact our  service in the interim with any additional questions or concerns.  Consults: none  Significant Diagnostic Studies:  Results for orders placed or performed during the hospital encounter of 11/18/15  Comprehensive metabolic panel  Result Value Ref Range   Sodium 136 135 - 145 mmol/L   Potassium 4.1 3.5 - 5.1  mmol/L   Chloride 106 101 - 111 mmol/L   CO2 23 22 - 32 mmol/L   Glucose, Bld 101 (H) 65 - 99 mg/dL   BUN 12 6 - 20 mg/dL   Creatinine, Ser 0.79 0.44 - 1.00 mg/dL   Calcium 9.1 8.9 - 10.3 mg/dL   Total Protein 7.4 6.5 - 8.1 g/dL   Albumin 4.1 3.5 - 5.0 g/dL   AST 30 15 - 41 U/L   ALT 15 14 - 54 U/L   Alkaline Phosphatase 65 38 - 126 U/L   Total Bilirubin 0.2 (L) 0.3 - 1.2 mg/dL   GFR calc non Af Amer >60 >60 mL/min   GFR calc Af Amer >60 >60 mL/min   Anion gap 7 5 - 15  CBC with Differential/Platelet  Result Value Ref Range   WBC 8.3 4.0 - 10.5 K/uL   RBC 4.21 3.87 - 5.11 MIL/uL   Hemoglobin 6.6 (LL) 12.0 - 15.0 g/dL   HCT 25.6 (L) 36.0 - 46.0 %   MCV 60.8 (L) 78.0 - 100.0 fL   MCH 15.7 (L) 26.0 - 34.0 pg   MCHC 25.8 (L) 30.0 - 36.0 g/dL   RDW 19.0 (H) 11.5 - 15.5 %   Platelets 531 (H) 150 - 400 K/uL   Neutrophils Relative % 64 %   Lymphocytes Relative 26 %   Monocytes Relative 7 %   Eosinophils Relative 2 %   Basophils Relative 1 %   Neutro Abs 5.2 1.7 - 7.7 K/uL   Lymphs Abs 2.2 0.7 - 4.0 K/uL   Monocytes Absolute 0.6 0.1 - 1.0 K/uL   Eosinophils Absolute 0.2 0.0 - 0.7 K/uL   Basophils Absolute 0.1 0.0 - 0.1 K/uL   RBC Morphology POLYCHROMASIA PRESENT   hCG, serum, qualitative  Result Value Ref Range   Preg, Serum NEGATIVE NEGATIVE  Protime-INR  Result Value Ref Range   Prothrombin Time 12.3 11.4 - 15.2 seconds   INR 0.91      Treatments: Successful bilateral uterine artery embolization on 11/18/15; IV Feraheme infusion 11/18/15  Discharge Exam: Blood pressure (!) 142/82, pulse 82, temperature 97.8 F (36.6 C), temperature source Oral, resp. rate 20, height 5\' 3"  (1.6 m), weight 235 lb 3.7 oz (106.7 kg), SpO2 92 %. Patient awake, alert. Chest clear to auscultation bilaterally. Heart with regular rate and rhythm. Abdomen obese, soft, positive bowel sounds, mild pelvic tenderness to palpation. Puncture site right common femoral artery soft, clean, dry, nontender, no  hematoma. Intact distal pulses.  Disposition: home  Discharge Instructions    Call MD for:  difficulty breathing, headache or visual disturbances    Complete by:  As directed   Call MD for:  extreme fatigue    Complete by:  As directed   Call MD for:  hives    Complete by:  As directed   Call MD for:  persistant dizziness or light-headedness    Complete by:  As directed   Call MD for:  persistant nausea and vomiting    Complete by:  As directed   Call MD for:  redness, tenderness, or signs of infection (pain, swelling, redness, odor or green/yellow discharge around incision  site)    Complete by:  As directed   Call MD for:  severe uncontrolled pain    Complete by:  As directed   Call MD for:  temperature >100.4    Complete by:  As directed   Change dressing (specify)    Complete by:  As directed   May change bandage over right groin region and place Band-Aid to site for the next 2-3 days. May wash site with soap and water.   Diet - low sodium heart healthy    Complete by:  As directed   Discharge instructions    Complete by:  As directed   Please contact primary care physician or Dr. Hulan Fray regarding restart of iron therapy   Driving Restrictions    Complete by:  As directed   No driving for next 48 hours   Increase activity slowly    Complete by:  As directed   Lifting restrictions    Complete by:  As directed   No heavy lifting for the next 3-4 days   May shower / Bathe    Complete by:  As directed   May walk up steps    Complete by:  As directed   Sexual Activity Restrictions    Complete by:  As directed   No sexual intercourse for 1 week       Medication List    STOP taking these medications   predniSONE 50 MG tablet Commonly known as:  DELTASONE     TAKE these medications   gabapentin 800 MG tablet Commonly known as:  NEURONTIN 1 tab in the morning, 1 tab midday, and 2 tabs at bedtime. What changed:  how much to take  how to take this  when to take this  additional  instructions   ibuprofen 200 MG tablet Commonly known as:  ADVIL,MOTRIN Take 400-600 mg by mouth every 6 (six) hours as needed for headache.   olopatadine 0.1 % ophthalmic solution Commonly known as:  PATANOL Place 1 drop into both eyes daily.   omeprazole 40 MG capsule Commonly known as:  PRILOSEC Take 1 capsule (40 mg total) by mouth daily. What changed:  when to take this  reasons to take this   tiZANidine 2 MG tablet Commonly known as:  ZANAFLEX Take 2 mg by mouth daily as needed (migraine/cycle.).      Follow-up Information    Jacqulynn Cadet, MD .   Specialties:  Interventional Radiology, Radiology Why:  Radiology will call you with follow up appointment with Dr. Laurence Ferrari or Dr. Anselm Pancoast in [facility] in 2 weeks; call [phone] or [phone] with any questions Contact information: [address] [place] [address] [phone]        Emily Filbert, MD .   Specialty:  Obstetrics and Gynecology Why:  continue current gynecologic care with Dr. Hulan Fray as scheduled Contact information: [place] [address] [phone]           Signed: D. Rowe Robert 11/19/2015, 11:43 AM Less than 30 minutes were spent discharging patient

## 2015-11-19 NOTE — Discharge Instructions (Signed)
Uterine Artery Embolization for Fibroids, Care After °Refer to this sheet in the next few weeks. These instructions provide you with information on caring for yourself after your procedure. Your health care provider may also give you more specific instructions. Your treatment has been planned according to current medical practices, but problems sometimes occur. Call your health care provider if you have any problems or questions after your procedure. °WHAT TO EXPECT AFTER THE PROCEDURE °After your procedure, it is typical to have cramping in the pelvis. You will be given pain medicine to control it. °HOME CARE INSTRUCTIONS °· Only take over-the-counter or prescription medicines for pain, discomfort, or fever as directed by your health care provider. °· Do not take aspirin. It can cause bleeding. °· Follow your health care provider's advice regarding medicines given to you, diet, activity, and when to begin sexual activity. °· See your health care provider for follow-up care as directed. °SEEK MEDICAL CARE IF: °· You have a fever. °· You have redness, swelling, and pain around your incision site. °· You have pus draining from your incision. °· You have a rash. °SEEK IMMEDIATE MEDICAL CARE IF: °· You have bleeding from your incision site. °· You have difficulty breathing. °· You have chest pain. °· You have severe abdominal pain. °· You have leg pain. °· You become dizzy and faint. °  °This information is not intended to replace advice given to you by your health care provider. Make sure you discuss any questions you have with your health care provider. °  °Document Released: 01/09/2013 Document Reviewed: 01/09/2013 °Elsevier Interactive Patient Education ©2016 [employer] ° °

## 2015-11-20 NOTE — Progress Notes (Signed)
Wasted 35mL of a Dilaudid PCA in the sink.  Witnessed by Wilkie Aye, RN

## 2015-12-02 ENCOUNTER — Ambulatory Visit
Admission: RE | Admit: 2015-12-02 | Discharge: 2015-12-02 | Disposition: A | Discharge status: Home / Self Care | Payer: BLUE CROSS/BLUE SHIELD | Source: Ambulatory Visit | Attending: Radiology | Admitting: Radiology

## 2015-12-02 ENCOUNTER — Encounter: Payer: Self-pay | Admitting: Radiology

## 2015-12-02 DIAGNOSIS — D259 Leiomyoma of uterus, unspecified: Secondary | ICD-10-CM

## 2015-12-02 HISTORY — PX: IR GENERIC HISTORICAL: IMG1180011

## 2015-12-02 NOTE — Progress Notes (Signed)
Patient ID: Elizabeth Copeland, female   DOB: [birth year], 45 y.o.   MRN: [id number]   Referring Physician(s): Dr. Clovia Cuff  Chief Complaint: The patient is seen in follow up today s/p Uterine artery embolization on 11/18/15 by Dr. Laurence Ferrari  History of present illness:  Elizabeth Copeland presents today s/p [place] by Dr. Laurence Ferrari on 11-18-15.  She has been doing well for the last 2 weeks.  She has finished all of her ibuprofen, but continues to have cramps on a daily basis.  They are intermittent, but do not limit her activity.  She has only had 2 days of spotting, but no other bleeding or discharge.  She has not had her first cycle yet since her procedure.  She is wanting to go back to work.  She presents today for follow up.  Past Medical History:  Diagnosis Date  . Anemia   . Back pain   . Complication of anesthesia    patient has not received anesthesia before but states father and brother have a hard time waking up from anesthesia  . Family history of adverse reaction to anesthesia    mother "has a hard time waking up and moody"   . Fibroid   . Headache   . Morbid obesity ([place])   . Pneumonia     Past Surgical History:  Procedure Laterality Date  . CESAREAN SECTION  2000  . DILATION AND CURETTAGE OF UTERUS N/A 09/01/2015   Procedure: DILATATION AND CURETTAGE;  Surgeon: Emily Filbert, MD;  Location: [facility];  Service: Gynecology;  Laterality: N/A;  . IR GENERIC HISTORICAL  11/18/2015   IR US GUIDE VASC ACCESS RIGHT 11/18/2015 [facility]  . IR GENERIC HISTORICAL  11/18/2015   IR ANGIOGRAM SELECTIVE EACH ADDITIONAL VESSEL 11/18/2015 [facility]  . IR GENERIC HISTORICAL  11/18/2015   IR EMBO TUMOR ORGAN ISCHEMIA INFARCT INC GUIDE ROADMAPPING 11/18/2015 [facility]  . IR GENERIC HISTORICAL  11/18/2015   IR ANGIOGRAM PELVIS SELECTIVE OR SUPRASELECTIVE 11/18/2015 [facility]  . IR GENERIC HISTORICAL  11/18/2015   IR ANGIOGRAM SELECTIVE EACH ADDITIONAL VESSEL 11/18/2015 [facility]  . IR GENERIC  HISTORICAL  11/18/2015   IR ANGIOGRAM PELVIS SELECTIVE OR SUPRASELECTIVE 11/18/2015 [facility]  . WISDOM TOOTH EXTRACTION      Allergies: Review of patient's allergies indicates no known allergies.  Medications: Prior to Admission medications   Medication Sig Start Date End Date Taking? Authorizing Provider  gabapentin (NEURONTIN) 800 MG tablet 1 tab in the morning, 1 tab midday, and 2 tabs at bedtime. Patient taking differently: Take 800-1,600 mg by mouth 3 (three) times daily. 1 tab in the morning, 1 tab midday, and 2 tabs at bedtime. 05/06/15   Silverio Decamp, MD  ibuprofen (ADVIL,MOTRIN) 200 MG tablet Take 400-600 mg by mouth every 6 (six) hours as needed for headache.    Historical Provider, MD  olopatadine (PATANOL) 0.1 % ophthalmic solution Place 1 drop into both eyes daily.     Historical Provider, MD  omeprazole (PRILOSEC) 40 MG capsule Take 1 capsule (40 mg total) by mouth daily. Patient taking differently: Take 40 mg by mouth daily as needed (indigestion.).  01/03/14   Hali Marry, MD  tiZANidine (ZANAFLEX) 2 MG tablet Take 2 mg by mouth daily as needed (migraine/cycle.).    Historical Provider, MD     Family History  Problem Relation Age of Onset  . Hypertension    . Diabetes Father     Social History  Social History  . Marital status: Single    Spouse name: N/A  . Number of children: 1  . Years of education: 2 yr coll   Occupational History  . member [employer]   Social History Main Topics  . Smoking status: Former Smoker    Packs/day: 0.50    Years: 15.00    Types: Cigarettes    Quit date: 04/20/2002  . Smokeless tobacco: Never Used  . Alcohol use No  . Drug use: No  . Sexual activity: Not Currently   Other Topics Concern  . Not on file   Social History Narrative   No regular exercise.  Lives with her mother [name] and father Talia Debs     Vital Signs: BP (!) 156/84 (BP Location: Left  Arm, Patient Position: Sitting, Cuff Size: Normal)   Pulse 87   Temp 98.3 F (36.8 C) (Oral)   Resp 15   LMP 11/13/2015 (Approximate)   SpO2 98%   Physical Exam  Gen: obese, white female in NAD Heart: regular Lungs: CTAB Abd: obese, soft, tender minimally in the lower abdomen.  Right inguinal site is completely healed with no evidence of hematoma  Imaging: No results found.  Labs:  CBC:  Recent Labs  05/06/15 1021 06/17/15 0949 08/26/15 1050 11/18/15 0745  WBC 9.7 8.1 10.3 8.3  HGB 6.6* 7.1* 7.5* 6.6*  HCT 25.2* 26.6* 28.6* 25.6*  PLT 474* 514* 438* 531*    COAGS:  Recent Labs  11/18/15 0745  INR 0.91    BMP:  Recent Labs  05/06/15 1021 11/18/15 0745  NA 139 136  K 4.4 4.1  CL 104 106  CO2 23 23  GLUCOSE 82 101*  BUN 9 12  CALCIUM 8.9 9.1  CREATININE 0.77 0.79  GFRNONAA  --  >60  GFRAA  --  >60    LIVER FUNCTION TESTS:  Recent Labs  05/06/15 1021 11/18/15 0745  BILITOT 0.5 0.2*  AST 19 30  ALT 12 15  ALKPHOS 53 65  PROT 6.8 7.4  ALBUMIN 4.1 4.1    Assessment:  1. S/p Uterine Artery Embolization on 11-18-15 by Dr. Laurence Ferrari  The patient is doing well since her procedure.  She is still having some cramping, which is expected.  She is encouraged to continue taking her ibuprofen as needed.  She also still has some intermittent constipation for which I told her to continue taking her stool softener.    She may return to work.  We will have her follow up with Dr. Laurence Ferrari in 6 months with an MRI of her pelvis with and without contrast.  We will then see her after this imaging to follow up and go over her imaging results.  She is encouraged to call in the interim if she has any questions or concerns.  Signed: Henreitta Cea 12/02/2015, 10:31 AM   Please refer to Dr. Fritz Pickerel attestation of this note for management and plan.

## 2015-12-03 ENCOUNTER — Other Ambulatory Visit (HOSPITAL_COMMUNITY): Payer: Self-pay | Admitting: Interventional Radiology

## 2016-02-16 ENCOUNTER — Other Ambulatory Visit (HOSPITAL_COMMUNITY): Payer: Self-pay | Admitting: Interventional Radiology

## 2016-02-16 DIAGNOSIS — D259 Leiomyoma of uterus, unspecified: Secondary | ICD-10-CM

## 2016-02-23 ENCOUNTER — Other Ambulatory Visit (HOSPITAL_COMMUNITY): Payer: Self-pay | Admitting: Interventional Radiology

## 2016-02-23 ENCOUNTER — Other Ambulatory Visit: Payer: Self-pay | Admitting: Radiology

## 2016-02-23 ENCOUNTER — Ambulatory Visit
Admission: RE | Admit: 2016-02-23 | Discharge: 2016-02-23 | Disposition: A | Discharge status: Home / Self Care | Payer: BLUE CROSS/BLUE SHIELD | Source: Ambulatory Visit | Attending: Interventional Radiology | Admitting: Interventional Radiology

## 2016-02-23 ENCOUNTER — Encounter: Payer: Self-pay | Admitting: Interventional Radiology

## 2016-02-23 DIAGNOSIS — D259 Leiomyoma of uterus, unspecified: Secondary | ICD-10-CM | POA: Diagnosis not present

## 2016-02-23 HISTORY — PX: IR GENERIC HISTORICAL: IMG1180011

## 2016-02-23 LAB — CBC
HCT: 35.1 % (ref 35.0–45.0)
Hemoglobin: 10.1 g/dL — ABNORMAL LOW (ref 11.7–15.5)
MCH: 19.3 pg — ABNORMAL LOW (ref 27.0–33.0)
MCHC: 28.8 g/dL — ABNORMAL LOW (ref 32.0–36.0)
MCV: 67.2 fL — ABNORMAL LOW (ref 80.0–100.0)
MPV: 8.7 fL (ref 7.5–12.5)
PLATELETS: 455 10*3/uL — AB (ref 140–400)
RBC: 5.22 MIL/uL — ABNORMAL HIGH (ref 3.80–5.10)
RDW: 17.3 % — AB (ref 11.0–15.0)
WBC: 9.7 10*3/uL (ref 3.8–10.8)

## 2016-02-23 LAB — BASIC METABOLIC PANEL
BUN: 9 mg/dL (ref 7–25)
CHLORIDE: 104 mmol/L (ref 98–110)
CO2: 25 mmol/L (ref 20–31)
CREATININE: 0.82 mg/dL (ref 0.50–1.10)
Calcium: 9.1 mg/dL (ref 8.6–10.2)
Glucose, Bld: 80 mg/dL (ref 65–99)
POTASSIUM: 4.1 mmol/L (ref 3.5–5.3)
Sodium: 137 mmol/L (ref 135–146)

## 2016-02-23 NOTE — Progress Notes (Signed)
Pain is worse now than before UFE with burning across low back.  Cramping begins about two weeks before start of period and ends about five days after cycle.  Pain is not relieved by Ibuprofen 800mg  PO and patient wishes she still had the pain pump she had in the hospital.  Breast pain/tenderness is worse now and stops as soon as her period starts.  jkl

## 2016-02-23 NOTE — Progress Notes (Signed)
Chief Complaint: Pelvic pain Cramping  Supervising Physician: Jacqulynn Cadet  History of Present Illness: Elizabeth Copeland is a 45 y.o. female who is s/p Uterine artery embolization on 11/18/2015 by Dr. Laurence Ferrari for symptomatic fibroids (menorrhagia and anemia = Hgb 6.6)  She is here today because she reports she continues to have severe pelvic pain.    She describes her pain as a "burning and tearing sensation".  She is taking 800 mg of Advil 4 times per day without any relief at all. She states she even gets up in the middle of the night to take Advil.  She reports the pain is actually worse now that prior to the procedure. She states she spent 2 days in bed due to the pain during her last cycle and that is not like her. She states "I always just trudge through".  She states she could not even stand up straight because of the pain.  She does confirm that her heavy bleeding has improved though.   The first month after procedure her period lasted about 4 days, this past month is was about 8 days which is back to normal for her.   She states the pain/cramping/tearing lasts the entire time.  She also c/o burning during her cycle and she cannot tell if it is a UTI or if it is from her flow.  She also reports some change in bowel habits as well. She states she used to go every day at the same time, but recently she has had some constipation. She has not taken any narcotic medications to cause this.  She denies any foul or different odor in her urine.  There is mild spotting between her periods.  She denies fever/chills/recent illness.  Past Medical History:  Diagnosis Date  . Anemia   . Back pain   . Complication of anesthesia    patient has not received anesthesia before but states father and brother have a hard time waking up from anesthesia  . Family history of adverse reaction to anesthesia    mother "has a hard time waking up and moody"   . Fibroid   . Headache    . Morbid obesity ([place])   . Pneumonia     Past Surgical History:  Procedure Laterality Date  . CESAREAN SECTION  2000  . DILATION AND CURETTAGE OF UTERUS N/A 09/01/2015   Procedure: DILATATION AND CURETTAGE;  Surgeon: Emily Filbert, MD;  Location: [facility];  Service: Gynecology;  Laterality: N/A;  . IR GENERIC HISTORICAL  11/18/2015   IR US GUIDE VASC ACCESS RIGHT 11/18/2015 [facility]  . IR GENERIC HISTORICAL  11/18/2015   IR ANGIOGRAM SELECTIVE EACH ADDITIONAL VESSEL 11/18/2015 [facility]  . IR GENERIC HISTORICAL  11/18/2015   IR EMBO TUMOR ORGAN ISCHEMIA INFARCT INC GUIDE ROADMAPPING 11/18/2015 [facility]  . IR GENERIC HISTORICAL  11/18/2015   IR ANGIOGRAM PELVIS SELECTIVE OR SUPRASELECTIVE 11/18/2015 [facility]  . IR GENERIC HISTORICAL  11/18/2015   IR ANGIOGRAM SELECTIVE EACH ADDITIONAL VESSEL 11/18/2015 [facility]  . IR GENERIC HISTORICAL  11/18/2015   IR ANGIOGRAM PELVIS SELECTIVE OR SUPRASELECTIVE 11/18/2015 [facility]  . WISDOM TOOTH EXTRACTION      Allergies: Patient has no known allergies.  Medications: Prior to Admission medications   Medication Sig Start Date End Date Taking? Authorizing Provider  gabapentin (NEURONTIN) 800 MG tablet 1 tab in the morning, 1 tab midday, and 2 tabs at bedtime. Patient taking differently: Take 800-1,600 mg by mouth  3 (three) times daily. 1 tab in the morning, 1 tab midday, and 2 tabs at bedtime. 05/06/15  Yes Silverio Decamp, MD  ibuprofen (ADVIL,MOTRIN) 200 MG tablet Take 400-600 mg by mouth every 6 (six) hours as needed for headache.   Yes Historical Provider, MD  olopatadine (PATANOL) 0.1 % ophthalmic solution Place 1 drop into both eyes daily.    Yes Historical Provider, MD  omeprazole (PRILOSEC) 40 MG capsule Take 1 capsule (40 mg total) by mouth daily. Patient taking differently: Take 40 mg by mouth daily as needed (indigestion.).  01/03/14  Yes Hali Marry, MD     Family History  Problem Relation Age of Onset  .  Hypertension    . Diabetes Father     Social History   Social History  . Marital status: Single    Spouse name: N/A  . Number of children: 1  . Years of education: 2 yr coll   Occupational History  . member [employer]   Social History Main Topics  . Smoking status: Former Smoker    Packs/day: 0.50    Years: 15.00    Types: Cigarettes    Quit date: 04/20/2002  . Smokeless tobacco: Never Used  . Alcohol use No  . Drug use: No  . Sexual activity: Not Currently   Other Topics Concern  . Not on file   Social History Narrative   No regular exercise.  Lives with her mother [name] and father Katheryne Clickner   Review of Systems: A 12 point ROS discussed and pertinent positives are indicated in the HPI above.  All other systems are negative.  Review of Systems  Vital Signs: BP 137/75 (BP Location: Right Arm, Patient Position: Sitting, Cuff Size: Normal)   Pulse 86   Temp 97.9 F (36.6 C)   Resp 20   Ht 5\' 3"  (1.6 m)   Wt 225 lb (102.1 kg)   LMP 02/04/2016 (Exact Date) Comment: ended on 02/12/16  SpO2 100%   BMI 39.86 kg/m   Physical Exam  Constitutional: She is oriented to person, place, and time.  Obese, NAD  HENT:  Head: Normocephalic and atraumatic.  Eyes: EOM are normal.  Neck: Normal range of motion.  Cardiovascular: Normal rate and regular rhythm.   Murmur heard. Soft systolic murmur (this is new per patient)  Abdominal: Soft. There is tenderness.  Fundus of the uterus is palpable. + tenderness to palpation of the uterus at the midline.  Musculoskeletal: Normal range of motion.  Neurological: She is alert and oriented to person, place, and time.  Skin: Skin is warm and dry.  Psychiatric: She has a normal mood and affect. Her behavior is normal. Judgment and thought content normal.  Vitals reviewed.   Labs:  CBC:  Recent Labs  05/06/15 1021 06/17/15 0949 08/26/15 1050 11/18/15 0745  WBC 9.7 8.1 10.3 8.3    HGB 6.6* 7.1* 7.5* 6.6*  HCT 25.2* 26.6* 28.6* 25.6*  PLT 474* 514* 438* 531*    COAGS:  Recent Labs  11/18/15 0745  INR 0.91    BMP:  Recent Labs  05/06/15 1021 11/18/15 0745  NA 139 136  K 4.4 4.1  CL 104 106  CO2 23 23  GLUCOSE 82 101*  BUN 9 12  CALCIUM 8.9 9.1  CREATININE 0.77 0.79  GFRNONAA  --  >60  GFRAA  --  >60    LIVER FUNCTION TESTS:  Recent Labs  05/06/15  1021 11/18/15 0745  BILITOT 0.5 0.2*  AST 19 30  ALT 12 15  ALKPHOS 53 65  PROT 6.8 7.4  ALBUMIN 4.1 4.1    TUMOR MARKERS: No results for input(s): AFPTM, CEA, CA199, CHROMGRNA in the last 8760 hours.  Assessment/Plan:  S/P Uterine artery embolization 11/18/2015 by Dr. Tennis Ship to have cramping, burning, tearing pain. On 800 mg Advil without relief.  Patient seen and examined today by myself and by Dr. Laurence Ferrari.  He explained to her that she as the fibroid shrinks, it could be pulling on surrounding tissues causing the tearing sensation.   He also feels she could still have a good amount of inflammatory response as well.  Will obtain urinalysis to evaluate for urinary tract infection. If positive, we can call her in an antibiotic.  Will obtain CBC to make sure hemoglobin is improving and a BMP to evaluate kidney function since she has been taking a lot of ibuprofen.  Prescription given for Norco 10/323 #15, no refills to help with the pain during her cycle and at night time.  Prescription given for Medrol Dose Pak for the inflammation. She was instructed to start this about 2 days prior to the onset of her cycle. She understands.  She has an appointment with gynecologist next week and she should keep this appointment.  We will see her back in 3 months and re-evaluate her. If her symptoms have not improved, will obtain another MRI to evaluate fibroid and see if it is stealing blood from another artery. If so, she may need another embolization. If this does not help,  then she may need to consider hysterectomy.  Electronically Signed: Murrell Redden PA-C 02/23/2016, 9:08 AM   Please refer to Dr. Katrinka Blazing attestation of this note for management and plan.

## 2016-02-24 ENCOUNTER — Telehealth: Payer: Self-pay | Admitting: Interventional Radiology

## 2016-02-24 LAB — URINALYSIS
Bilirubin Urine: NEGATIVE
Glucose, UA: NEGATIVE
HGB URINE DIPSTICK: NEGATIVE
Ketones, ur: NEGATIVE
LEUKOCYTES UA: NEGATIVE
NITRITE: NEGATIVE
PH: 6 (ref 5.0–8.0)
Protein, ur: NEGATIVE
SPECIFIC GRAVITY, URINE: 1.018 (ref 1.001–1.035)

## 2016-02-24 NOTE — Telephone Encounter (Signed)
Spoke with Mrs. Dowdy on the phone this morning and conveyed the good news of her lab results.  UA is normal, anemia significantly improving and renal function is also normal.   Signed,  Criselda Peaches, MD  Pager: [facility]: [phone]

## 2016-03-02 ENCOUNTER — Encounter: Payer: Self-pay | Admitting: Family Medicine

## 2016-03-02 ENCOUNTER — Ambulatory Visit: Payer: BLUE CROSS/BLUE SHIELD | Admitting: Obstetrics & Gynecology

## 2016-03-02 ENCOUNTER — Ambulatory Visit (INDEPENDENT_AMBULATORY_CARE_PROVIDER_SITE_OTHER): Payer: BLUE CROSS/BLUE SHIELD | Admitting: Family Medicine

## 2016-03-02 DIAGNOSIS — I10 Essential (primary) hypertension: Secondary | ICD-10-CM | POA: Diagnosis not present

## 2016-03-02 NOTE — Progress Notes (Signed)
Subjective:    CC: Abnormal weight gain    HPI:  Patient comes in today to discuss strategies for weight loss. She's a 45 year old female whose BMI is 40. She was working out over the summer but then in August she started having some problems with her back and also has had fibroids treated for which she is still having some symptoms and problems. She's not been able to get back to the gym since then. She is particularly interested in weight loss medication as a treatment option. Cut back on junk food already. She is down 8 pounds from August of this year so she has been working on it.  Hypertension- Pt denies chest pain, SOB, dizziness, or heart palpitations.  Taking meds as directed w/o problems.  Denies medication side effects.      Past medical history, Surgical history, Family history not pertinant except as noted below, Social history, Allergies, and medications have been entered into the medical record, reviewed, and corrections made.   Review of Systems: No fevers, chills, night sweats, weight loss, chest pain, or shortness of breath.   Objective:    General: Well Developed, well nourished, and in no acute distress.  Neuro: Alert and oriented x3, extra-ocular muscles intact, sensation grossly intact.  HEENT: Normocephalic, atraumatic  Skin: Warm and dry, no rashes. Cardiac: Regular rate and rhythm, no murmurs rubs or gallops, no lower extremity edema.  Respiratory: Clear to auscultation bilaterally. Not using accessory muscles, speaking in full sentences.   Impression and Recommendations:   Abnormal weight gain/obesity/BMI of 40-we discussed strategies to get back on track. Discussed the importance of having some type of regular exercise. I know she has some chronic back issues. She did agree that she could probably walk for at least 20-30 minutes a day. She cannot use the machines at the gym which I explained is partly fine but she can do things like wall squats etc. to work on  strengthening. Also discussed getting him back on track with diet. She often skips meals and sometimes only eats once a day and then eats very large amount at one time. We discussed the eating small amounts frequently throughout the day is what actually will boost her metabolism and that skipping meals was actually slight her metabolism down. I discussed the importance of eating breakfast every day. We also discussed watching portion sizes. I recommended a smart phone application called my fitness pal to help set calorie goals. Encouraged her to work on this for the next 2 months. If at that point she still struggling with weight loss then we could consider weight loss medication as an option but she may also want to check with her insurance to see if any of these are even covered under her current prescription drug plan.  HTN - Well controlled. Continue current regimen. Follow up in  6 months.   Time spent 30 min, > 50% spent counseling about weight loss/Obesity.

## 2016-03-02 NOTE — Patient Instructions (Signed)
My [employer]itness [employer] app is great to download.

## 2016-03-05 ENCOUNTER — Encounter: Payer: Self-pay | Admitting: Emergency Medicine

## 2016-03-05 ENCOUNTER — Emergency Department (INDEPENDENT_AMBULATORY_CARE_PROVIDER_SITE_OTHER)
Admission: EM | Admit: 2016-03-05 | Discharge: 2016-03-05 | Disposition: A | Discharge status: Home / Self Care | Payer: BLUE CROSS/BLUE SHIELD | Source: Home / Self Care | Attending: Family Medicine | Admitting: Family Medicine

## 2016-03-05 DIAGNOSIS — L298 Other pruritus: Secondary | ICD-10-CM

## 2016-03-05 DIAGNOSIS — N898 Other specified noninflammatory disorders of vagina: Secondary | ICD-10-CM

## 2016-03-05 DIAGNOSIS — N760 Acute vaginitis: Secondary | ICD-10-CM

## 2016-03-05 MED ORDER — FLUCONAZOLE 200 MG PO TABS: ORAL_TABLET | ORAL | 0 refills | Status: DC

## 2016-03-05 MED ORDER — ZINC OXIDE 40 % EX OINT: 1.0000 "application " | TOPICAL_OINTMENT | CUTANEOUS | 0 refills | Status: DC | PRN

## 2016-03-05 NOTE — ED Provider Notes (Signed)
CSN: [id number]     Arrival date & time 03/05/16  1750 History   First MD Initiated Contact with Patient 03/05/16 1759     Chief Complaint  Patient presents with  . Vaginal Itching   (Consider location/radiation/quality/duration/timing/severity/associated sxs/prior Treatment) HPI Elizabeth Copeland is a 45 y.o. female presenting to UC with c/o 2 days of vaginal itching and burning.  She has tried OTC vagisil but notes that causes more burning.  Denies hx of yeast infections that she can recall.  She notes she has been using a different body wash recently but denies recent use of antibiotics or other changes to daily routine. Denies vaginal discharge. Denies dysuria, hematuria, or urinary frequency.  She notes she has mild lower abdominal pain and back pain but notes both are chronic for her. Denies fever, chills, n/v/d.    Past Medical History:  Diagnosis Date  . Anemia   . Back pain   . Complication of anesthesia    patient has not received anesthesia before but states father and brother have a hard time waking up from anesthesia  . Family history of adverse reaction to anesthesia    mother "has a hard time waking up and moody"   . Fibroid   . Headache   . Morbid obesity ([place])   . Pneumonia    Past Surgical History:  Procedure Laterality Date  . CESAREAN SECTION  2000  . DILATION AND CURETTAGE OF UTERUS N/A 09/01/2015   Procedure: DILATATION AND CURETTAGE;  Surgeon: Emily Filbert, MD;  Location: [facility];  Service: Gynecology;  Laterality: N/A;  . IR GENERIC HISTORICAL  11/18/2015   IR US GUIDE VASC ACCESS RIGHT 11/18/2015 [facility]  . IR GENERIC HISTORICAL  11/18/2015   IR ANGIOGRAM SELECTIVE EACH ADDITIONAL VESSEL 11/18/2015 [facility]  . IR GENERIC HISTORICAL  11/18/2015   IR EMBO TUMOR ORGAN ISCHEMIA INFARCT INC GUIDE ROADMAPPING 11/18/2015 [facility]  . IR GENERIC HISTORICAL  11/18/2015   IR ANGIOGRAM PELVIS SELECTIVE OR SUPRASELECTIVE 11/18/2015 [facility]  . IR GENERIC  HISTORICAL  11/18/2015   IR ANGIOGRAM SELECTIVE EACH ADDITIONAL VESSEL 11/18/2015 [facility]  . IR GENERIC HISTORICAL  11/18/2015   IR ANGIOGRAM PELVIS SELECTIVE OR SUPRASELECTIVE 11/18/2015 [facility]  . IR GENERIC HISTORICAL  02/23/2016   IR RADIOLOGIST EVAL & MGMT 02/23/2016 Jacqulynn Cadet, MD [facility]  . WISDOM TOOTH EXTRACTION     Family History  Problem Relation Age of Onset  . Hypertension    . Diabetes Father    Social History  Substance Use Topics  . Smoking status: Former Smoker    Packs/day: 0.50    Years: 15.00    Types: Cigarettes    Quit date: 04/20/2002  . Smokeless tobacco: Never Used  . Alcohol use No   OB History    Gravida Para Term Preterm AB Living   1 1       1    SAB TAB Ectopic Multiple Live Births                 Review of Systems  Constitutional: Negative for chills and fever.  Genitourinary: Positive for vaginal pain (burning and itching). Negative for dysuria, flank pain, frequency, hematuria, urgency, vaginal bleeding and vaginal discharge.    Allergies  Patient has no known allergies.  Home Medications   Prior to Admission medications   Medication Sig Start Date End Date Taking? Authorizing Provider  fluconazole (DIFLUCAN) 200 MG tablet Take 1 tab once.  May repeat in 3 days if still having symptoms. 03/05/16   Noland Fordyce, PA-C  gabapentin (NEURONTIN) 800 MG tablet 1 tab in the morning, 1 tab midday, and 2 tabs at bedtime. Patient taking differently: Take 800-1,600 mg by mouth 3 (three) times daily. 1 tab in the morning, 1 tab midday, and 2 tabs at bedtime. 05/06/15   Silverio Decamp, MD  ibuprofen (ADVIL,MOTRIN) 200 MG tablet Take 400-600 mg by mouth every 6 (six) hours as needed for headache.    Historical Provider, MD  liver oil-zinc oxide (DESITIN) 40 % ointment Apply 1 application topically as needed for irritation. 03/05/16   Noland Fordyce, PA-C  omeprazole (PRILOSEC) 40 MG capsule Take 1 capsule (40 mg total) by mouth  daily. Patient taking differently: Take 40 mg by mouth daily as needed (indigestion.).  01/03/14   Hali Marry, MD   Meds Ordered and Administered this Visit  Medications - No data to display  BP 141/98 (BP Location: Left Arm)   Pulse 84   Temp 98.5 F (36.9 C) (Oral)   Ht 5\' 3"  (1.6 m)   Wt 225 lb (102.1 kg)   LMP 02/04/2016 (Exact Date) Comment: ended on 02/12/16  SpO2 99%   BMI 39.86 kg/m  No data found.   Physical Exam  Constitutional: She is oriented to person, place, and time. She appears well-developed and well-nourished. No distress.  HENT:  Head: Normocephalic and atraumatic.  Mouth/Throat: Oropharynx is clear and moist.  Eyes: EOM are normal.  Neck: Normal range of motion.  Cardiovascular: Normal rate and regular rhythm.   Pulmonary/Chest: Effort normal. No respiratory distress. She has no wheezes. She has no rales.  Abdominal: Soft. She exhibits no distension. There is no tenderness. There is no CVA tenderness.  Musculoskeletal: Normal range of motion.  Neurological: She is alert and oriented to person, place, and time.  Skin: Skin is warm and dry. She is not diaphoretic.  Psychiatric: She has a normal mood and affect. Her behavior is normal.  Nursing note and vitals reviewed.   Urgent Care Course   Clinical Course     Procedures (including critical care time)  Labs Review Labs Reviewed  WET PREP BY MOLECULAR PROBE    Imaging Review No results found.   MDM   1. Vaginal itching   2. Acute vaginitis    Pt c/o vaginal itching and burning after using a new body wash.  Will send wet prep from pt's self swab. Will treat for likely yeast infection.  Rx: diflucan and desitin until symptoms start to improve. F/u with PCP or OB/GYN in 1 week if not improving.  Encouraged to try using a different body wash that is gentle on skin. She has used [place] before w/o reaction.    Noland Fordyce, PA-C 03/06/16 1113

## 2016-03-05 NOTE — ED Triage Notes (Signed)
Pt c/o vaginal itching x 2 days, happens a couple of days before menstrual cycle.  Tried OTC vagisil

## 2016-03-07 LAB — WET PREP BY MOLECULAR PROBE
Candida species: NEGATIVE
Gardnerella vaginalis: POSITIVE — AB
Trichomonas vaginosis: NEGATIVE

## 2016-03-08 ENCOUNTER — Telehealth: Payer: Self-pay | Admitting: *Deleted

## 2016-03-08 MED ORDER — METRONIDAZOLE 500 MG PO TABS: 500.0000 mg | ORAL_TABLET | Freq: Two times a day (BID) | ORAL | 0 refills | Status: DC

## 2016-03-08 NOTE — Telephone Encounter (Signed)
Spoke to pt given wet prep results, sent in rx for flagyl per Greggory Stallion, PA, and advised her to call back if she has any questions or concerns.

## 2016-04-02 ENCOUNTER — Other Ambulatory Visit: Payer: Self-pay | Admitting: Sports Medicine

## 2016-04-02 DIAGNOSIS — M5416 Radiculopathy, lumbar region: Secondary | ICD-10-CM

## 2016-04-07 ENCOUNTER — Encounter: Payer: Self-pay | Admitting: Interventional Radiology

## 2016-04-07 ENCOUNTER — Ambulatory Visit (INDEPENDENT_AMBULATORY_CARE_PROVIDER_SITE_OTHER): Payer: BLUE CROSS/BLUE SHIELD | Admitting: Obstetrics & Gynecology

## 2016-04-07 VITALS — BP 148/80 | HR 83 | Resp 16 | Ht 66.0 in | Wt 230.0 lb

## 2016-04-07 DIAGNOSIS — N949 Unspecified condition associated with female genital organs and menstrual cycle: Secondary | ICD-10-CM

## 2016-04-07 DIAGNOSIS — N898 Other specified noninflammatory disorders of vagina: Secondary | ICD-10-CM | POA: Diagnosis not present

## 2016-04-07 NOTE — Progress Notes (Signed)
   Subjective:    Patient ID: Elizabeth Copeland, female    DOB: Jun 16, 1970, 46 y.o.   MRN: [id number]  HPI 46 yo lady s/p d&c and ablation 8/17 here with vaginal "burning" and annoying vaginal discharge "since the procedure". She is still having periods but they are getting lighter and less painful with each period.   Review of Systems She is abstinent.    Objective:   Physical Exam Pleasant obese WFNAD Breathing, conversing, and ambulating normally Normal appearing vulva and vagina Cervix has a large amount of sticky vaginal discharge c/w ovulation (pregesterone effect) No odor noted      Assessment & Plan:  Vaginal "burning"- uncertain etiology  Wet prep sent rec trial of OTC cortisone prn RTC prn

## 2016-04-11 LAB — CERVICOVAGINAL ANCILLARY ONLY
BACTERIAL VAGINITIS: NEGATIVE
CANDIDA VAGINITIS: NEGATIVE

## 2016-04-14 ENCOUNTER — Telehealth: Payer: Self-pay | Admitting: *Deleted

## 2016-04-14 DIAGNOSIS — B9689 Other specified bacterial agents as the cause of diseases classified elsewhere: Secondary | ICD-10-CM

## 2016-04-14 DIAGNOSIS — N760 Acute vaginitis: Principal | ICD-10-CM

## 2016-04-14 MED ORDER — METRONIDAZOLE 500 MG PO TABS: 500.0000 mg | ORAL_TABLET | Freq: Two times a day (BID) | ORAL | 0 refills | Status: DC

## 2016-04-14 NOTE — Telephone Encounter (Signed)
LM on voicemail of BV on wet prep.  RX for Flagyl was sent to [facility] per Dr Hulan Fray.

## 2016-04-14 NOTE — Telephone Encounter (Signed)
-----   Message from Emily Filbert, MD sent at 04/14/2016  8:53 AM EST ----- Please give her flagyl 500 mg BID for 7 days for BV. Thanks

## 2016-04-20 ENCOUNTER — Encounter: Payer: Self-pay | Admitting: Obstetrics & Gynecology

## 2016-04-28 NOTE — Telephone Encounter (Signed)
LM, got VM

## 2016-05-11 ENCOUNTER — Other Ambulatory Visit (HOSPITAL_COMMUNITY): Payer: Self-pay | Admitting: Interventional Radiology

## 2016-05-11 DIAGNOSIS — D259 Leiomyoma of uterus, unspecified: Secondary | ICD-10-CM

## 2016-05-12 ENCOUNTER — Other Ambulatory Visit (HOSPITAL_COMMUNITY): Payer: Self-pay | Admitting: Interventional Radiology

## 2016-05-12 DIAGNOSIS — D259 Leiomyoma of uterus, unspecified: Secondary | ICD-10-CM

## 2016-06-16 ENCOUNTER — Other Ambulatory Visit: Payer: Self-pay

## 2016-06-16 DIAGNOSIS — M5416 Radiculopathy, lumbar region: Secondary | ICD-10-CM

## 2016-06-16 MED ORDER — GABAPENTIN 800 MG PO TABS: ORAL_TABLET | ORAL | 0 refills | Status: DC

## 2016-06-29 ENCOUNTER — Emergency Department (INDEPENDENT_AMBULATORY_CARE_PROVIDER_SITE_OTHER)
Admission: EM | Admit: 2016-06-29 | Discharge: 2016-06-29 | Disposition: A | Discharge status: Home / Self Care | Payer: BLUE CROSS/BLUE SHIELD | Source: Home / Self Care | Attending: Family Medicine | Admitting: Family Medicine

## 2016-06-29 ENCOUNTER — Emergency Department (INDEPENDENT_AMBULATORY_CARE_PROVIDER_SITE_OTHER): Payer: BLUE CROSS/BLUE SHIELD

## 2016-06-29 ENCOUNTER — Encounter: Payer: Self-pay | Admitting: Emergency Medicine

## 2016-06-29 DIAGNOSIS — J069 Acute upper respiratory infection, unspecified: Secondary | ICD-10-CM | POA: Diagnosis not present

## 2016-06-29 DIAGNOSIS — R05 Cough: Secondary | ICD-10-CM

## 2016-06-29 DIAGNOSIS — R509 Fever, unspecified: Secondary | ICD-10-CM | POA: Diagnosis not present

## 2016-06-29 DIAGNOSIS — B9789 Other viral agents as the cause of diseases classified elsewhere: Secondary | ICD-10-CM | POA: Diagnosis not present

## 2016-06-29 MED ORDER — ALBUTEROL SULFATE HFA 108 (90 BASE) MCG/ACT IN AERS: 1.0000 | INHALATION_SPRAY | Freq: Four times a day (QID) | RESPIRATORY_TRACT | 0 refills | Status: DC | PRN

## 2016-06-29 MED ORDER — PREDNISONE 20 MG PO TABS: ORAL_TABLET | ORAL | 0 refills | Status: DC

## 2016-06-29 MED ORDER — BENZONATATE 100 MG PO CAPS: 100.0000 mg | ORAL_CAPSULE | Freq: Three times a day (TID) | ORAL | 0 refills | Status: DC

## 2016-06-29 NOTE — ED Triage Notes (Signed)
Productive cough, sore throat, chest hurts, fever at night x 5 days

## 2016-06-29 NOTE — ED Provider Notes (Signed)
CSN: [id number]     Arrival date & time 06/29/16  0806 History   First MD Initiated Contact with Patient 06/29/16 [id number]     Chief Complaint  Patient presents with  . Cough   (Consider location/radiation/quality/duration/timing/severity/associated sxs/prior Treatment) HPI  Elizabeth Copeland is a 46 y.o. female presenting to UC with c/o 5 days of gradually worsening cough that is mildly productive, chest soreness, sore throat, and "low grade" fever at night.  She has taken OTC Robitussin with mild temporary relief.  Pt's daughter notes she was sick about 2 weeks ago.  No recent travel. No hx of asthma. Denies n/v/d. Denies SOB.   Past Medical History:  Diagnosis Date  . Anemia   . Back pain   . Complication of anesthesia    patient has not received anesthesia before but states father and brother have a hard time waking up from anesthesia  . Family history of adverse reaction to anesthesia    mother "has a hard time waking up and moody"   . Fibroid   . Headache   . Morbid obesity ([place])   . Pneumonia    Past Surgical History:  Procedure Laterality Date  . CESAREAN SECTION  2000  . DILATION AND CURETTAGE OF UTERUS N/A 09/01/2015   Procedure: DILATATION AND CURETTAGE;  Surgeon: Emily Filbert, MD;  Location: [facility];  Service: Gynecology;  Laterality: N/A;  . IR GENERIC HISTORICAL  11/18/2015   IR US GUIDE VASC ACCESS RIGHT 11/18/2015 [facility]  . IR GENERIC HISTORICAL  11/18/2015   IR ANGIOGRAM SELECTIVE EACH ADDITIONAL VESSEL 11/18/2015 [facility]  . IR GENERIC HISTORICAL  11/18/2015   IR EMBO TUMOR ORGAN ISCHEMIA INFARCT INC GUIDE ROADMAPPING 11/18/2015 [facility]  . IR GENERIC HISTORICAL  11/18/2015   IR ANGIOGRAM PELVIS SELECTIVE OR SUPRASELECTIVE 11/18/2015 [facility]  . IR GENERIC HISTORICAL  11/18/2015   IR ANGIOGRAM SELECTIVE EACH ADDITIONAL VESSEL 11/18/2015 [facility]  . IR GENERIC HISTORICAL  11/18/2015   IR ANGIOGRAM PELVIS SELECTIVE OR SUPRASELECTIVE 11/18/2015 [facility]  . IR GENERIC HISTORICAL  02/23/2016   IR RADIOLOGIST EVAL & MGMT 02/23/2016 Jacqulynn Cadet, MD [facility]  . IR GENERIC HISTORICAL  12/02/2015   IR RADIOLOGIST EVAL & MGMT 12/02/2015 Greggory Keen, MD [facility]  . WISDOM TOOTH EXTRACTION     Family History  Problem Relation Age of Onset  . Hypertension    . Diabetes Father    Social History  Substance Use Topics  . Smoking status: Former Smoker    Packs/day: 0.50    Years: 15.00    Types: Cigarettes    Quit date: 04/20/2002  . Smokeless tobacco: Never Used  . Alcohol use No   OB History    Gravida Para Term Preterm AB Living   1 1       1    SAB TAB Ectopic Multiple Live Births                 Review of Systems  Constitutional: Positive for fever. Negative for chills.  HENT: Positive for congestion, postnasal drip, rhinorrhea and sore throat. Negative for ear pain, trouble swallowing and voice change.   Respiratory: Positive for cough. Negative for shortness of breath.   Cardiovascular: Positive for chest pain ( soreness from cough). Negative for palpitations.  Gastrointestinal: Negative for abdominal pain, diarrhea, nausea and vomiting.  Musculoskeletal: Negative for arthralgias, back pain and myalgias.  Skin: Negative for rash.    Allergies  Patient has no known allergies.  Home Medications   Prior to Admission medications   Medication Sig Start Date End Date Taking? Authorizing Provider  albuterol (PROVENTIL HFA;VENTOLIN HFA) 108 (90 Base) MCG/ACT inhaler Inhale 1-2 puffs into the lungs every 6 (six) hours as needed for wheezing or shortness of breath. 06/29/16   Noland Fordyce, PA-C  benzonatate (TESSALON) 100 MG capsule Take 1-2 capsules (100-200 mg total) by mouth every 8 (eight) hours. 06/29/16   Noland Fordyce, PA-C  gabapentin (NEURONTIN) 800 MG tablet TAKE 1 TABLET BY MOUTH IN THE MORNING, 1 TABLET MIDDAY, AND 2 TABLETS AT BEDTIME 06/16/16   Silverio Decamp, MD  ibuprofen (ADVIL,MOTRIN)  200 MG tablet Take 400-600 mg by mouth every 6 (six) hours as needed for headache.    Historical Provider, MD  predniSONE (DELTASONE) 20 MG tablet 3 tabs po day one, then 2 po daily x 4 days 06/29/16   Noland Fordyce, PA-C   Meds Ordered and Administered this Visit  Medications - No data to display  BP (!) 143/93 (BP Location: Left Arm)   Pulse 90   Temp 97.6 F (36.4 C) (Oral)   Ht 5\' 3"  (1.6 m)   Wt 230 lb (104.3 kg)   LMP 06/16/2016   SpO2 97%   BMI 40.74 kg/m  No data found.   Physical Exam  Constitutional: She is oriented to person, place, and time. She appears well-developed and well-nourished.  HENT:  Head: Normocephalic and atraumatic.  Eyes: EOM are normal.  Neck: Normal range of motion. Neck supple.  Cardiovascular: Normal rate and regular rhythm.   Pulmonary/Chest: Effort normal. No stridor. No respiratory distress. She has no wheezes. She has rhonchi in the right lower field and the left lower field. She has no rales.  Musculoskeletal: Normal range of motion.  Lymphadenopathy:    She has no cervical adenopathy.  Neurological: She is alert and oriented to person, place, and time.  Skin: Skin is warm and dry.  Psychiatric: She has a normal mood and affect. Her behavior is normal.  Nursing note and vitals reviewed.   Urgent Care Course     Procedures (including critical care time)  Labs Review Labs Reviewed - No data to display  Imaging Review Dg Chest 2 View  Result Date: 06/29/2016 CLINICAL DATA:  Cough, fever. EXAM: CHEST  2 VIEW COMPARISON:  None. FINDINGS: Cardiac size is within normal limits. Mild central pulmonary vascular congestion is noted. No pneumothorax or pleural effusion is noted. Both lungs are clear. The visualized skeletal structures are unremarkable. IMPRESSION: Mild central pulmonary vascular congestion. No other abnormality seen. Electronically Signed   By: Marijo Conception, M.D.   On: 06/29/2016 08:49     MDM   1. Viral URI with cough     Hx and exam c/w viral URI  Encouraged symptomatic treatment at this time. Rx: Prednisone, albuterol inhaler, and tessalon  f/u with PCP in 1 week if not improving, sooner if worsening.     Noland Fordyce, PA-C 06/29/16 [phone]

## 2016-07-11 ENCOUNTER — Other Ambulatory Visit: Payer: Self-pay | Admitting: Sports Medicine

## 2016-07-11 DIAGNOSIS — M5416 Radiculopathy, lumbar region: Secondary | ICD-10-CM

## 2016-08-24 ENCOUNTER — Ambulatory Visit
Admission: RE | Admit: 2016-08-24 | Discharge: 2016-08-24 | Disposition: A | Discharge status: Home / Self Care | Payer: BLUE CROSS/BLUE SHIELD | Source: Ambulatory Visit | Attending: Interventional Radiology | Admitting: Interventional Radiology

## 2016-08-24 DIAGNOSIS — D259 Leiomyoma of uterus, unspecified: Secondary | ICD-10-CM | POA: Diagnosis not present

## 2016-08-24 HISTORY — PX: IR RADIOLOGIST EVAL & MGMT: IMG5224

## 2016-08-24 NOTE — Progress Notes (Signed)
Referring Physician(s): Dove,M  Chief Complaint: The patient is seen in follow up today s/p bilateral uterine artery embolization on 11/18/15  History of present illness: Mrs. Forge is a 46 year old female with history of symptomatic uterine fibroids who underwent successful bilateral uterine artery embolization on 11/18/15. She presents again today for routine follow-up. According to the patient she has done fairly well since her last IR visit. She states that her periods are not as long or heavy. They currently last approximately 7 days in duration with 3-4 days of slightly heavier bleeding but no significant passage of tissue or clot passage associated with it. She also denies significant cramping. In addition she denies  recent fevers, nausea vomiting, respiratory problems, dysuria, hematuria, or groin access site discomfort. She states that her cycles currently feel like they are more normal in nature.   Past Medical History:  Diagnosis Date  . Anemia   . Back pain   . Complication of anesthesia    patient has not received anesthesia before but states father and brother have a hard time waking up from anesthesia  . Family history of adverse reaction to anesthesia    mother "has a hard time waking up and moody"   . Fibroid   . Headache   . Morbid obesity ([place])   . Pneumonia     Past Surgical History:  Procedure Laterality Date  . CESAREAN SECTION  2000  . DILATION AND CURETTAGE OF UTERUS N/A 09/01/2015   Procedure: DILATATION AND CURETTAGE;  Surgeon: Emily Filbert, MD;  Location: [facility];  Service: Gynecology;  Laterality: N/A;  . IR GENERIC HISTORICAL  11/18/2015   IR US GUIDE VASC ACCESS RIGHT 11/18/2015 [facility]  . IR GENERIC HISTORICAL  11/18/2015   IR ANGIOGRAM SELECTIVE EACH ADDITIONAL VESSEL 11/18/2015 [facility]  . IR GENERIC HISTORICAL  11/18/2015   IR EMBO TUMOR ORGAN ISCHEMIA INFARCT INC GUIDE ROADMAPPING 11/18/2015 [facility]  . IR GENERIC HISTORICAL  11/18/2015     IR ANGIOGRAM PELVIS SELECTIVE OR SUPRASELECTIVE 11/18/2015 [facility]  . IR GENERIC HISTORICAL  11/18/2015   IR ANGIOGRAM SELECTIVE EACH ADDITIONAL VESSEL 11/18/2015 [facility]  . IR GENERIC HISTORICAL  11/18/2015   IR ANGIOGRAM PELVIS SELECTIVE OR SUPRASELECTIVE 11/18/2015 [facility]  . IR GENERIC HISTORICAL  02/23/2016   IR RADIOLOGIST EVAL & MGMT 02/23/2016 Jacqulynn Cadet, MD [facility]  . IR GENERIC HISTORICAL  12/02/2015   IR RADIOLOGIST EVAL & MGMT 12/02/2015 Greggory Keen, MD [facility]  . WISDOM TOOTH EXTRACTION      Allergies: Patient has no known allergies.  Medications: Prior to Admission medications   Medication Sig Start Date End Date Taking? Authorizing Provider  gabapentin (NEURONTIN) 800 MG tablet TAKE 1 TABLET BY MOUTH IN THE MORNING, 1 TABLET MIDDAY, AND 2 TABLETS AT BEDTIME 06/16/16  Yes Silverio Decamp, MD  ibuprofen (ADVIL,MOTRIN) 200 MG tablet Take 400-600 mg by mouth every 6 (six) hours as needed for headache.   Yes [provider]  albuterol (PROVENTIL HFA;VENTOLIN HFA) 108 (90 Base) MCG/ACT inhaler Inhale 1-2 puffs into the lungs every 6 (six) hours as needed for wheezing or shortness of breath. Patient not taking: Reported on 08/24/2016 06/29/16   Noland Fordyce, PA-C  benzonatate (TESSALON) 100 MG capsule Take 1-2 capsules (100-200 mg total) by mouth every 8 (eight) hours. Patient not taking: Reported on 08/24/2016 06/29/16   Noland Fordyce, PA-C  gabapentin (NEURONTIN) 800 MG tablet TAKE 1 TABLET BY MOUTH IN THE  MORNING, 1 TABLET MIDDAY, AND 2 TABLETS AT BEDTIME 07/11/16   Silverio Decamp, MD  predniSONE (DELTASONE) 20 MG tablet 3 tabs po day one, then 2 po daily x 4 days Patient not taking: Reported on 08/24/2016 06/29/16   Noland Fordyce, PA-C     Family History  Problem Relation Age of Onset  . Hypertension Unknown   . Diabetes Father     Social History   Social History  . Marital status: Single    Spouse name:  N/A  . Number of children: 1  . Years of education: 2 yr coll   Occupational History  . member Building control surveyor [employer]   Social History Main Topics  . Smoking status: Former Smoker    Packs/day: 0.50    Years: 15.00    Types: Cigarettes    Quit date: 04/20/2002  . Smokeless tobacco: Never Used  . Alcohol use No  . Drug use: No  . Sexual activity: Not Currently    Birth control/ protection: None   Other Topics Concern  . Not on file   Social History Narrative   No regular exercise.  Lives with her mother [name] and father Zineb Glade     Vital Signs: BP (!) 123/59   Pulse 78   Temp 98 F (36.7 C) (Oral)   Resp 15   Ht 5\' 3"  (1.6 m)   Wt 230 lb (104.3 kg)   LMP 08/03/2016   SpO2 97%   BMI 40.74 kg/m   Physical Exam awake, alert. Chest clear to auscultation bilaterally. Heart with regular rate and rhythm. Abdomen soft, obese, positive bowel sounds, nontender. Lower extremities with no significant edema, intact distal pulses.  Imaging: No results found.  Labs:  CBC:  Recent Labs  08/26/15 1050 11/18/15 0745 02/23/16 1019  WBC 10.3 8.3 9.7  HGB 7.5* 6.6* 10.1*  HCT 28.6* 25.6* 35.1  PLT 438* 531* 455*    COAGS:  Recent Labs  11/18/15 0745  INR 0.91    BMP:  Recent Labs  11/18/15 0745 02/23/16 1019  NA 136 137  K 4.1 4.1  CL 106 104  CO2 23 25  GLUCOSE 101* 80  BUN 12 9  CALCIUM 9.1 9.1  CREATININE 0.79 0.82  GFRNONAA >60  --   GFRAA >60  --     LIVER FUNCTION TESTS:  Recent Labs  11/18/15 0745  BILITOT 0.2*  AST 30  ALT 15  ALKPHOS 65  PROT 7.4  ALBUMIN 4.1    Assessment: Patient with history of symptomatic uterine fibroids, status post successful bilateral uterine artery embolization on 11/18/15. Patient currently stable with normal menstrual cycles, diminished pelvic cramping/pain and no further significant passage of clots or tissue fragments. At this time patient requires no further  follow-up unless symptoms recur . She should continue gynecologic care with Dr. Hulan Fray. Signed: D. Rowe Robert, PA-C 08/24/2016, 11:12 AM   Please refer to Dr Katrinka Blazing attestation of this note for management and plan.      Patient ID: Shannan Slinker, female   DOB: 1970/05/31, 46 y.o.   MRN: [id number]

## 2016-08-24 NOTE — Progress Notes (Signed)
LMP:  08/03/2016.  Frequency:  Every month.  Length:  7 days.  Heavier flow x 3-4 days w/ occasional clots, but has noted improvement as compared to pre [place].   States that cramping has improved.  Does not feel as moody/hormonal post [place].    Oswald Pott Riki Rusk, [place] 08/24/2016 10:45 AM

## 2016-08-31 ENCOUNTER — Encounter: Payer: Self-pay | Admitting: Family Medicine

## 2016-08-31 ENCOUNTER — Ambulatory Visit (INDEPENDENT_AMBULATORY_CARE_PROVIDER_SITE_OTHER): Payer: BLUE CROSS/BLUE SHIELD | Admitting: Family Medicine

## 2016-08-31 VITALS — BP 146/86 | HR 92 | Ht 63.0 in | Wt 236.0 lb

## 2016-08-31 DIAGNOSIS — R1032 Left lower quadrant pain: Secondary | ICD-10-CM

## 2016-08-31 DIAGNOSIS — D72829 Elevated white blood cell count, unspecified: Secondary | ICD-10-CM

## 2016-08-31 DIAGNOSIS — D72820 Lymphocytosis (symptomatic): Secondary | ICD-10-CM

## 2016-08-31 DIAGNOSIS — R300 Dysuria: Secondary | ICD-10-CM | POA: Diagnosis not present

## 2016-08-31 DIAGNOSIS — R319 Hematuria, unspecified: Secondary | ICD-10-CM | POA: Diagnosis not present

## 2016-08-31 LAB — CBC WITH DIFFERENTIAL/PLATELET
BASOS ABS: 0 {cells}/uL (ref 0–200)
BASOS PCT: 0 %
EOS PCT: 2 %
Eosinophils Absolute: 266 cells/uL (ref 15–500)
HCT: 31.8 % — ABNORMAL LOW (ref 35.0–45.0)
HEMOGLOBIN: 9.8 g/dL — AB (ref 11.7–15.5)
LYMPHS ABS: 3059 {cells}/uL (ref 850–3900)
Lymphocytes Relative: 23 %
MCH: 19.2 pg — AB (ref 27.0–33.0)
MCHC: 30.8 g/dL — ABNORMAL LOW (ref 32.0–36.0)
MCV: 62.4 fL — ABNORMAL LOW (ref 80.0–100.0)
MPV: 8.7 fL (ref 7.5–12.5)
Monocytes Absolute: 1064 cells/uL — ABNORMAL HIGH (ref 200–950)
Monocytes Relative: 8 %
NEUTROS ABS: 8911 {cells}/uL — AB (ref 1500–7800)
Neutrophils Relative %: 67 %
PLATELETS: 452 10*3/uL — AB (ref 140–400)
RBC: 5.1 MIL/uL (ref 3.80–5.10)
RDW: 19.6 % — ABNORMAL HIGH (ref 11.0–15.0)
WBC: 13.3 10*3/uL — ABNORMAL HIGH (ref 3.8–10.8)

## 2016-08-31 LAB — COMPLETE METABOLIC PANEL WITH GFR
ALBUMIN: 4.2 g/dL (ref 3.6–5.1)
ALK PHOS: 62 U/L (ref 33–115)
ALT: 16 U/L (ref 6–29)
AST: 18 U/L (ref 10–35)
BUN: 12 mg/dL (ref 7–25)
CO2: 22 mmol/L (ref 20–31)
Calcium: 9.2 mg/dL (ref 8.6–10.2)
Chloride: 106 mmol/L (ref 98–110)
Creat: 0.75 mg/dL (ref 0.50–1.10)
GLUCOSE: 88 mg/dL (ref 65–99)
POTASSIUM: 4.3 mmol/L (ref 3.5–5.3)
SODIUM: 135 mmol/L (ref 135–146)
Total Bilirubin: 0.3 mg/dL (ref 0.2–1.2)
Total Protein: 7 g/dL (ref 6.1–8.1)

## 2016-08-31 LAB — POCT URINALYSIS DIPSTICK
Bilirubin, UA: NEGATIVE
GLUCOSE UA: NEGATIVE
KETONES UA: NEGATIVE
Leukocytes, UA: NEGATIVE
Nitrite, UA: NEGATIVE
PROTEIN UA: NEGATIVE
SPEC GRAV UA: 1.015 (ref 1.010–1.025)
UROBILINOGEN UA: 0.2 U/dL
pH, UA: 7.5 (ref 5.0–8.0)

## 2016-08-31 NOTE — Addendum Note (Signed)
Addended by: Beatrice Lecher D on: 08/31/2016 09:48 PM   Modules accepted: Orders

## 2016-08-31 NOTE — Patient Instructions (Signed)
Call if not better in the next 48 hours.  Call if you have another fever this evening. Call if he starts to have nausea or if you notice any blood in the stool.
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
## 2016-09-01 ENCOUNTER — Ambulatory Visit (HOSPITAL_BASED_OUTPATIENT_CLINIC_OR_DEPARTMENT_OTHER)
Admission: RE | Admit: 2016-09-01 | Discharge: 2016-09-01 | Disposition: A | Discharge status: Home / Self Care | Payer: BLUE CROSS/BLUE SHIELD | Source: Ambulatory Visit | Attending: Family Medicine | Admitting: Family Medicine

## 2016-09-01 DIAGNOSIS — R1032 Left lower quadrant pain: Secondary | ICD-10-CM

## 2016-09-01 DIAGNOSIS — R109 Unspecified abdominal pain: Secondary | ICD-10-CM | POA: Diagnosis not present

## 2016-09-01 DIAGNOSIS — K760 Fatty (change of) liver, not elsewhere classified: Secondary | ICD-10-CM | POA: Insufficient documentation

## 2016-09-01 DIAGNOSIS — K5730 Diverticulosis of large intestine without perforation or abscess without bleeding: Secondary | ICD-10-CM | POA: Diagnosis not present

## 2016-09-01 DIAGNOSIS — D259 Leiomyoma of uterus, unspecified: Secondary | ICD-10-CM | POA: Insufficient documentation

## 2016-09-01 DIAGNOSIS — D72820 Lymphocytosis (symptomatic): Secondary | ICD-10-CM | POA: Diagnosis not present

## 2016-09-01 LAB — URINALYSIS, MICROSCOPIC ONLY
BACTERIA UA: NONE SEEN [HPF]
CASTS: NONE SEEN [LPF]
CRYSTALS: NONE SEEN [HPF]
Yeast: NONE SEEN [HPF]

## 2016-09-01 MED ORDER — IOPAMIDOL (ISOVUE-300) INJECTION 61%
100.0000 mL | Freq: Once | INTRAVENOUS | Status: AC | PRN
  Administered 2016-09-01: 100 mL via INTRAVENOUS

## 2016-09-02 ENCOUNTER — Encounter: Payer: Self-pay | Admitting: Family Medicine

## 2016-09-02 DIAGNOSIS — K760 Fatty (change of) liver, not elsewhere classified: Secondary | ICD-10-CM | POA: Insufficient documentation

## 2016-09-02 NOTE — Addendum Note (Signed)
Addended by: Teddy Spike on: 09/02/2016 04:53 PM   Modules accepted: Orders

## 2016-09-05 DIAGNOSIS — D72829 Elevated white blood cell count, unspecified: Secondary | ICD-10-CM | POA: Diagnosis not present

## 2016-09-05 LAB — CBC
HCT: 33.1 % — ABNORMAL LOW (ref 35.0–45.0)
Hemoglobin: 9.6 g/dL — ABNORMAL LOW (ref 11.7–15.5)
MCH: 19 pg — AB (ref 27.0–33.0)
MCHC: 29 g/dL — ABNORMAL LOW (ref 32.0–36.0)
MCV: 65.4 fL — AB (ref 80.0–100.0)
MPV: 8.8 fL (ref 7.5–12.5)
PLATELETS: 415 10*3/uL — AB (ref 140–400)
RBC: 5.06 MIL/uL (ref 3.80–5.10)
RDW: 18.4 % — ABNORMAL HIGH (ref 11.0–15.0)
WBC: 10.2 10*3/uL (ref 3.8–10.8)

## 2016-09-16 ENCOUNTER — Encounter: Payer: Self-pay | Admitting: Interventional Radiology

## 2016-10-11 ENCOUNTER — Other Ambulatory Visit: Payer: Self-pay | Admitting: Sports Medicine

## 2016-10-11 DIAGNOSIS — M5416 Radiculopathy, lumbar region: Secondary | ICD-10-CM

## 2016-10-19 ENCOUNTER — Ambulatory Visit: Payer: BLUE CROSS/BLUE SHIELD | Admitting: Sports Medicine

## 2016-11-07 ENCOUNTER — Encounter: Payer: Self-pay | Admitting: Family Medicine

## 2016-11-07 MED ORDER — PREDNISONE 50 MG PO TABS: ORAL_TABLET | ORAL | 0 refills | Status: DC

## 2016-11-16 ENCOUNTER — Encounter: Payer: Self-pay | Admitting: Sports Medicine

## 2016-11-16 ENCOUNTER — Ambulatory Visit (INDEPENDENT_AMBULATORY_CARE_PROVIDER_SITE_OTHER): Payer: BLUE CROSS/BLUE SHIELD | Admitting: Sports Medicine

## 2016-11-16 DIAGNOSIS — M5416 Radiculopathy, lumbar region: Secondary | ICD-10-CM

## 2016-11-16 MED ORDER — DIAZEPAM 5 MG PO TABS: ORAL_TABLET | ORAL | 0 refills | Status: DC

## 2016-11-16 MED ORDER — PREDNISONE 10 MG (48) PO TBPK: ORAL_TABLET | Freq: Every day | ORAL | 0 refills | Status: DC

## 2016-11-16 NOTE — Assessment & Plan Note (Signed)
Recurrence of pain, no improvement with prednisone. Continue gabapentin. Adding an additional taper of prednisone. Symptoms are right S1 distribution, we do need a new MRI, this will be in anticipation of a right S1 selective epidural. Return to go over MRI results, she prefers [facility] on Saturday. Valium for claustrophobia.

## 2016-11-16 NOTE — Progress Notes (Signed)
  Subjective:    CC: Low back pain  HPI: This is a pleasant 46 year old female with known lumbar degenerative disc disease and radiculopathy. She's had epidurals in the past with moderate response is. More recently we had her with a burst of prednisone, she really didn't improve all that much. Continues to have pain running down the back of her right leg, to the outer 2 toes. No bowel or bladder dysfunction, saddle numbness, or constitutional symptoms.  Past medical history:  Negative.  See flowsheet/record as well for more information.  Surgical history: Negative.  See flowsheet/record as well for more information.  Family history: Negative.  See flowsheet/record as well for more information.  Social history: Negative.  See flowsheet/record as well for more information.  Allergies, and medications have been entered into the medical record, reviewed, and no changes needed.   Review of Systems: No fevers, chills, night sweats, weight loss, chest pain, or shortness of breath.   Objective:    General: Well Developed, well nourished, and in no acute distress.  Neuro: Alert and oriented x3, extra-ocular muscles intact, sensation grossly intact.  HEENT: Normocephalic, atraumatic, pupils equal round reactive to light, neck supple, no masses, no lymphadenopathy, thyroid nonpalpable.  Skin: Warm and dry, no rashes. Cardiac: Regular rate and rhythm, no murmurs rubs or gallops, no lower extremity edema.  Respiratory: Clear to auscultation bilaterally. Not using accessory muscles, speaking in full sentences. Back Exam:  Inspection: Unremarkable  Motion: Flexion 45 deg, Extension 45 deg, Side Bending to 45 deg bilaterally,  Rotation to 45 deg bilaterally  SLR laying: Negative  XSLR laying: Negative  Palpable tenderness: None. FABER: negative. Sensory change: Gross sensation intact to all lumbar and sacral dermatomes.  Reflexes: 2+ at both patellar tendons, 2+ at achilles tendons, Babinski's  downgoing.  Strength at foot  Plantar-flexion: 5/5 Dorsi-flexion: 5/5 Eversion: 5/5 Inversion: 5/5  Leg strength  Quad: 5/5 Hamstring: 5/5 Hip flexor: 5/5 Hip abductors: 5/5  Gait unremarkable.  Impression and Recommendations:    Right lumbar radiculitis Recurrence of pain, no improvement with prednisone. Continue gabapentin. Adding an additional taper of prednisone. Symptoms are right S1 distribution, we do need a new MRI, this will be in anticipation of a right S1 selective epidural. Return to go over MRI results, she prefers [facility] on Saturday. Valium for claustrophobia.  I spent 25 minutes with this patient, greater than 50% was face-to-face time counseling regarding the above diagnoses

## 2016-11-19 ENCOUNTER — Ambulatory Visit (HOSPITAL_BASED_OUTPATIENT_CLINIC_OR_DEPARTMENT_OTHER)
Admission: RE | Admit: 2016-11-19 | Discharge: 2016-11-19 | Disposition: A | Discharge status: Home / Self Care | Payer: BLUE CROSS/BLUE SHIELD | Source: Ambulatory Visit | Attending: Sports Medicine | Admitting: Sports Medicine

## 2016-11-19 DIAGNOSIS — M5416 Radiculopathy, lumbar region: Secondary | ICD-10-CM

## 2016-11-19 DIAGNOSIS — M545 Low back pain: Secondary | ICD-10-CM | POA: Diagnosis not present

## 2016-11-19 DIAGNOSIS — M5137 Other intervertebral disc degeneration, lumbosacral region: Secondary | ICD-10-CM | POA: Diagnosis not present

## 2016-11-23 ENCOUNTER — Encounter: Payer: Self-pay | Admitting: Sports Medicine

## 2016-11-23 ENCOUNTER — Ambulatory Visit (INDEPENDENT_AMBULATORY_CARE_PROVIDER_SITE_OTHER): Payer: BLUE CROSS/BLUE SHIELD | Admitting: Sports Medicine

## 2016-11-23 DIAGNOSIS — M5416 Radiculopathy, lumbar region: Secondary | ICD-10-CM

## 2016-11-23 NOTE — Progress Notes (Signed)
  Subjective:    CC: Follow-up  HPI: This is a pleasant 46 year old female with right lumbar radiculitis, she does describe both an L5 and S1 type distribution, no bowel or bladder distention, saddle numbness, or constitutional symptoms.  Past medical history:  Negative.  See flowsheet/record as well for more information.  Surgical history: Negative.  See flowsheet/record as well for more information.  Family history: Negative.  See flowsheet/record as well for more information.  Social history: Negative.  See flowsheet/record as well for more information.  Allergies, and medications have been entered into the medical record, reviewed, and no changes needed.   Review of Systems: No fevers, chills, night sweats, weight loss, chest pain, or shortness of breath.   Objective:    General: Well Developed, well nourished, and in no acute distress.  Neuro: Alert and oriented x3, extra-ocular muscles intact, sensation grossly intact.  HEENT: Normocephalic, atraumatic, pupils equal round reactive to light, neck supple, no masses, no lymphadenopathy, thyroid nonpalpable.  Skin: Warm and dry, no rashes. Cardiac: Regular rate and rhythm, no murmurs rubs or gallops, no lower extremity edema.  Respiratory: Clear to auscultation bilaterally. Not using accessory muscles, speaking in full sentences.  MRI personally reviewed, there is a moderate sized L5-S1 protruding disc, broad-based with a central component as well, it does appear to contact of the right intraspinal S1 nerve root with displacement of the nerve roots in the spinal canal, I also see some element of far lateral contact of the right L5 nerve root with the disc. I do suspect that when upright the disc protrusions further.  Impression and Recommendations:    Right lumbar radiculitis On personal review of the MRI the L5-S1 disc does appear to contact the intraspinal right S1 nerve root, this is similar to the MRI from 3 years ago. I also see  the extraforaminal L5 nerve root coming close to the disc with a far lateral protrusion. Symptoms are both L5 and S1 distribution, we are to start with a right selective S1 epidural, if insufficient we will then proceed with a right L5-S1 transforaminal epidural targeting the L5 nerve root.  Return to see me one month after injection for further evaluation.  I spent 25 minutes with this patient, greater than 50% was face-to-face time counseling regarding the above diagnoses

## 2016-11-23 NOTE — Assessment & Plan Note (Signed)
On personal review of the MRI the L5-S1 disc does appear to contact the intraspinal right S1 nerve root, this is similar to the MRI from 3 years ago. I also see the extraforaminal L5 nerve root coming close to the disc with a far lateral protrusion. Symptoms are both L5 and S1 distribution, we are to start with a right selective S1 epidural, if insufficient we will then proceed with a right L5-S1 transforaminal epidural targeting the L5 nerve root.  Return to see me one month after injection for further evaluation.

## 2016-11-30 ENCOUNTER — Encounter: Payer: Self-pay | Admitting: Family Medicine

## 2016-12-14 ENCOUNTER — Ambulatory Visit (INDEPENDENT_AMBULATORY_CARE_PROVIDER_SITE_OTHER): Payer: BLUE CROSS/BLUE SHIELD | Admitting: Family Medicine

## 2016-12-14 VITALS — BP 140/68 | HR 79 | Ht 63.0 in | Wt 239.0 lb

## 2016-12-14 DIAGNOSIS — R631 Polydipsia: Secondary | ICD-10-CM | POA: Diagnosis not present

## 2016-12-14 DIAGNOSIS — M791 Myalgia, unspecified site: Secondary | ICD-10-CM

## 2016-12-14 DIAGNOSIS — F4320 Adjustment disorder, unspecified: Secondary | ICD-10-CM

## 2016-12-14 DIAGNOSIS — F4321 Adjustment disorder with depressed mood: Secondary | ICD-10-CM

## 2016-12-14 LAB — POCT GLYCOSYLATED HEMOGLOBIN (HGB A1C): HEMOGLOBIN A1C: 5.5

## 2016-12-14 NOTE — Patient Instructions (Signed)
If symptoms happen again and please let me know what is some additional blood work looking muscle enzyme levels etc.

## 2016-12-14 NOTE — Progress Notes (Signed)
Subjective:    Patient ID: Elizabeth Copeland, female    DOB: 1970/09/26, 46 y.o.   MRN: [id number]  HPI 46-year-old female comes in today complaining of multiple tender areas on her body. Says it happened about 2 weeks ago and they were cleaning out her mom's things. Her mom recently passed away.  She was getting pain and high emotions around that time. Her pain was 10/10.  Took Advil and Tylenol for it.  She was also no a steroid at that time for her back. She says most the discomfort was over her shoulders and down her side over her rib cage. A couple days later she actually been felt tenderness and pain in her anterior thighs. She felt like she was well-hydrated during that period of time. Fact she said she felt more thirsty and has actually drinking and drinking more water. She admit she's been craving sweets lately and has gained about 5 pounds.  She's noticed that she's just not as motivated. She still getting up and going to work regularly but even a coworker pointed out that they thought that she might be depressed. She says when she gets home he gets off work she's not motivated to do anything and that's really not like her at all.   Review of Systems   BP 140/68   Pulse 79   Ht 5\' 3"  (1.6 m)   Wt 239 lb (108.4 kg)   SpO2 97%   BMI 42.34 kg/m     No Known Allergies  Past Medical History:  Diagnosis Date  . Anemia   . Back pain   . Complication of anesthesia    patient has not received anesthesia before but states father and brother have a hard time waking up from anesthesia  . Family history of adverse reaction to anesthesia    mother "has a hard time waking up and moody"   . Fibroid   . Headache   . Morbid obesity ([place])   . Pneumonia     Past Surgical History:  Procedure Laterality Date  . CESAREAN SECTION  2000  . DILATION AND CURETTAGE OF UTERUS N/A 09/01/2015   Procedure: DILATATION AND CURETTAGE;  Surgeon: Emily Filbert, MD;  Location: [facility];  Service: Gynecology;   Laterality: N/A;  . IR GENERIC HISTORICAL  11/18/2015   IR US GUIDE VASC ACCESS RIGHT 11/18/2015 [facility]  . IR GENERIC HISTORICAL  11/18/2015   IR ANGIOGRAM SELECTIVE EACH ADDITIONAL VESSEL 11/18/2015 [facility]  . IR GENERIC HISTORICAL  11/18/2015   IR EMBO TUMOR ORGAN ISCHEMIA INFARCT INC GUIDE ROADMAPPING 11/18/2015 [facility]  . IR GENERIC HISTORICAL  11/18/2015   IR ANGIOGRAM PELVIS SELECTIVE OR SUPRASELECTIVE 11/18/2015 [facility]  . IR GENERIC HISTORICAL  11/18/2015   IR ANGIOGRAM SELECTIVE EACH ADDITIONAL VESSEL 11/18/2015 [facility]  . IR GENERIC HISTORICAL  11/18/2015   IR ANGIOGRAM PELVIS SELECTIVE OR SUPRASELECTIVE 11/18/2015 [facility]  . IR GENERIC HISTORICAL  02/23/2016   IR RADIOLOGIST EVAL & MGMT 02/23/2016 Jacqulynn Cadet, MD [facility]  . IR GENERIC HISTORICAL  12/02/2015   IR RADIOLOGIST EVAL & MGMT 12/02/2015 Greggory Keen, MD [facility]  . IR RADIOLOGIST EVAL & MGMT  08/24/2016  . WISDOM TOOTH EXTRACTION      Social History   Social History  . Marital status: Single    Spouse name: N/A  . Number of children: 1  . Years of education: 2 yr coll   Occupational History  .  member Building control surveyor [employer]   Social History Main Topics  . Smoking status: Former Smoker    Packs/day: 0.50    Years: 15.00    Types: Cigarettes    Quit date: 04/20/2002  . Smokeless tobacco: Never Used  . Alcohol use No  . Drug use: No  . Sexual activity: Not Currently    Birth control/ protection: None   Other Topics Concern  . Not on file   Social History Narrative   No regular exercise.  Lives with her mother Elizabeth Copeland and father Elizabeth Copeland    Family History  Problem Relation Age of Onset  . Hypertension Unknown   . Diabetes Father     Outpatient Encounter Prescriptions as of 12/14/2016  Medication Sig  . gabapentin (NEURONTIN) 800 MG tablet TAKE 1 TABLET BY MOUTH IN THE MORNING, 1 TABLET MIDDAY, AND 2 TABLETS AT  BEDTIME. Need follow up appointment.  . [DISCONTINUED] predniSONE (STERAPRED UNI-PAK 48 TAB) 10 MG (48) TBPK tablet Take by mouth daily. Use as directed for taper   No facility-administered encounter medications on file as of 12/14/2016.           Objective:   Physical Exam  Constitutional: She is oriented to person, place, and time. She appears well-developed and well-nourished.  HENT:  Head: Normocephalic and atraumatic.  Neck: Neck supple. No thyromegaly present.  Cardiovascular: Normal rate, regular rhythm and normal heart sounds.   Pulmonary/Chest: Effort normal and breath sounds normal.  Lymphadenopathy:    She has no cervical adenopathy.  Neurological: She is alert and oriented to person, place, and time. She has normal reflexes.  Skin: Skin is warm and dry.  Psychiatric: She has a normal mood and affect. Her behavior is normal.       Assessment & Plan:  Myalgias - Overall her symptoms seem to have resolved. That she has a little bit of discomfort over the lateral rib cage bilaterally.  Inc thrist - check A1C. a1c was 5.5 which was normal. No sign of diabetes.  Grieving - encouraged her to reach out to hospice which does free grieving counseling. I really think this would be helpful in benefit her. She should shot sure she is really ready to talk about it. PHQ 9 score of 1. Will monitor.

## 2016-12-29 ENCOUNTER — Encounter: Payer: Self-pay | Admitting: Family Medicine

## 2016-12-29 ENCOUNTER — Ambulatory Visit (INDEPENDENT_AMBULATORY_CARE_PROVIDER_SITE_OTHER): Payer: BLUE CROSS/BLUE SHIELD | Admitting: Family Medicine

## 2016-12-29 VITALS — BP 148/80 | HR 91 | Temp 98.9°F | Wt 239.0 lb

## 2016-12-29 DIAGNOSIS — J301 Allergic rhinitis due to pollen: Secondary | ICD-10-CM | POA: Diagnosis not present

## 2016-12-29 DIAGNOSIS — I10 Essential (primary) hypertension: Secondary | ICD-10-CM | POA: Diagnosis not present

## 2016-12-29 MED ORDER — PREDNISONE 20 MG PO TABS: 40.0000 mg | ORAL_TABLET | Freq: Every day | ORAL | 0 refills | Status: DC

## 2016-12-29 MED ORDER — LISINOPRIL 10 MG PO TABS: 10.0000 mg | ORAL_TABLET | Freq: Every day | ORAL | 1 refills | Status: DC

## 2016-12-29 NOTE — Progress Notes (Signed)
Subjective:    Patient ID: Elizabeth Copeland, female    DOB: 11-18-1970, 46 y.o.   MRN: [id number]  HPI 46 year old female comes in today complaining of a not feeling well for about 6 days. She's had watery itchy eyes, itchy ears, right ear pain, significant nasal congestion and then starting yesterday started developing a cough. She's also had a lot of postnasal drip. She said normally this time year she'll get allergy symptoms and take her Zyrtec. Normally after couple days she'll actually get some significant relief but did not this time. In fact she feels like she's getting a little worse. No wheezing.  She notes bp has been high the last few time she has checked it .   Review of Systems  BP (!) 148/80   Pulse 91   Temp 98.9 F (37.2 C) (Oral)   Wt 239 lb (108.4 kg)   SpO2 98%   BMI 42.34 kg/m     No Known Allergies  Past Medical History:  Diagnosis Date  . Anemia   . Back pain   . Complication of anesthesia    patient has not received anesthesia before but states father and brother have a hard time waking up from anesthesia  . Family history of adverse reaction to anesthesia    mother "has a hard time waking up and moody"   . Fibroid   . Headache   . Morbid obesity ([place])   . Pneumonia     Past Surgical History:  Procedure Laterality Date  . CESAREAN SECTION  2000  . DILATION AND CURETTAGE OF UTERUS N/A 09/01/2015   Procedure: DILATATION AND CURETTAGE;  Surgeon: Emily Filbert, MD;  Location: [facility];  Service: Gynecology;  Laterality: N/A;  . IR GENERIC HISTORICAL  11/18/2015   IR US GUIDE VASC ACCESS RIGHT 11/18/2015 [facility]  . IR GENERIC HISTORICAL  11/18/2015   IR ANGIOGRAM SELECTIVE EACH ADDITIONAL VESSEL 11/18/2015 [facility]  . IR GENERIC HISTORICAL  11/18/2015   IR EMBO TUMOR ORGAN ISCHEMIA INFARCT INC GUIDE ROADMAPPING 11/18/2015 [facility]  . IR GENERIC HISTORICAL  11/18/2015   IR ANGIOGRAM PELVIS SELECTIVE OR SUPRASELECTIVE 11/18/2015 [facility]  . IR  GENERIC HISTORICAL  11/18/2015   IR ANGIOGRAM SELECTIVE EACH ADDITIONAL VESSEL 11/18/2015 [facility]  . IR GENERIC HISTORICAL  11/18/2015   IR ANGIOGRAM PELVIS SELECTIVE OR SUPRASELECTIVE 11/18/2015 [facility]  . IR GENERIC HISTORICAL  02/23/2016   IR RADIOLOGIST EVAL & MGMT 02/23/2016 Jacqulynn Cadet, MD [facility]  . IR GENERIC HISTORICAL  12/02/2015   IR RADIOLOGIST EVAL & MGMT 12/02/2015 Greggory Keen, MD [facility]  . IR RADIOLOGIST EVAL & MGMT  08/24/2016  . WISDOM TOOTH EXTRACTION      Social History   Social History  . Marital status: Single    Spouse name: N/A  . Number of children: 1  . Years of education: 2 yr coll   Occupational History  . member Building control surveyor [employer]   Social History Main Topics  . Smoking status: Former Smoker    Packs/day: 0.50    Years: 15.00    Types: Cigarettes    Quit date: 04/20/2002  . Smokeless tobacco: Never Used  . Alcohol use No  . Drug use: No  . Sexual activity: Not Currently    Birth control/ protection: None   Other Topics Concern  . Not on file   Social History Narrative   No regular exercise.  Lives with her mother [name] and father Alverda Nazzaro    Family History  Problem Relation Age of Onset  . Hypertension Unknown   . Diabetes Father     Outpatient Encounter Prescriptions as of 12/29/2016  Medication Sig  . gabapentin (NEURONTIN) 800 MG tablet TAKE 1 TABLET BY MOUTH IN THE MORNING, 1 TABLET MIDDAY, AND 2 TABLETS AT BEDTIME. Need follow up appointment.  Marland Kitchen lisinopril (PRINIVIL,ZESTRIL) 10 MG tablet Take 1 tablet (10 mg total) by mouth daily.  . predniSONE (DELTASONE) 20 MG tablet Take 2 tablets (40 mg total) by mouth daily.   No facility-administered encounter medications on file as of 12/29/2016.          Objective:   Physical Exam  Constitutional: She is oriented to person, place, and time. She appears well-developed and well-nourished.  HENT:  Head:  Normocephalic and atraumatic.  Right Ear: External ear normal.  Left Ear: External ear normal.  Nose: Nose normal.  Mouth/Throat: Oropharynx is clear and moist.  TMs and canals are clear.   Eyes: Pupils are equal, round, and reactive to light. Conjunctivae and EOM are normal.  Neck: Neck supple. No thyromegaly present.  Cardiovascular: Normal rate, regular rhythm and normal heart sounds.   Pulmonary/Chest: Effort normal and breath sounds normal. She has no wheezes.  Lymphadenopathy:    She has no cervical adenopathy.  Neurological: She is alert and oriented to person, place, and time.  Skin: Skin is warm and dry.  Psychiatric: She has a normal mood and affect.          Assessment & Plan:  Rhinitis-not responding to Zyrtec so we'll treat with 5 days of prednisone. Also consider that she could have a secondary viral illness on top of her current symptoms that could be exacerbating things for her. If she's not significantly better after the weekend and please let me know and can consider treating for sinusitis at that point. Lungs are clear which is great.  New diagnosis of hypertension-the last several time she's been here for blood pressure has been high. We'll go ahead and start with some. One about potential for side effects. Follow up in 2 weeks for nurse visit and will need a BMP at that time.

## 2017-01-11 ENCOUNTER — Encounter: Payer: Self-pay | Admitting: Family Medicine

## 2017-01-11 ENCOUNTER — Ambulatory Visit (INDEPENDENT_AMBULATORY_CARE_PROVIDER_SITE_OTHER): Payer: BLUE CROSS/BLUE SHIELD | Admitting: Family Medicine

## 2017-01-11 VITALS — BP 140/68 | HR 87 | Temp 98.5°F

## 2017-01-11 DIAGNOSIS — Z23 Encounter for immunization: Secondary | ICD-10-CM

## 2017-01-11 DIAGNOSIS — J019 Acute sinusitis, unspecified: Secondary | ICD-10-CM | POA: Diagnosis not present

## 2017-01-11 MED ORDER — AMOXICILLIN 875 MG PO TABS: 875.0000 mg | ORAL_TABLET | Freq: Two times a day (BID) | ORAL | 0 refills | Status: DC

## 2017-01-11 NOTE — Patient Instructions (Addendum)
Sinusitis, Adult Sinusitis is soreness and inflammation of your sinuses. Sinuses are hollow spaces in the bones around your face. They are located:  Around your eyes.  In the middle of your forehead.  Behind your nose.  In your cheekbones.  Your sinuses and nasal passages are lined with a stringy fluid (mucus). Mucus normally drains out of your sinuses. When your nasal tissues get inflamed or swollen, the mucus can get trapped or blocked so air cannot flow through your sinuses. This lets bacteria, viruses, and funguses grow, and that leads to infection. Follow these instructions at home: Medicines  Take, use, or apply over-the-counter and prescription medicines only as told by your doctor. These may include nasal sprays.  If you were prescribed an antibiotic medicine, take it as told by your doctor. Do not stop taking the antibiotic even if you start to feel better. Hydrate and Humidify  Drink enough water to keep your pee (urine) clear or pale yellow.  Use a cool mist humidifier to keep the humidity level in your home above 50%.  Breathe in steam for 10-15 minutes, 3-4 times a day or as told by your doctor. You can do this in the bathroom while a hot shower is running.  Try not to spend time in cool or dry air. Rest  Rest as much as possible.  Sleep with your head raised (elevated).  Make sure to get enough sleep each night. General instructions  Put a warm, moist washcloth on your face 3-4 times a day or as told by your doctor. This will help with discomfort.  Wash your hands often with soap and water. If there is no soap and water, use hand sanitizer.  Do not smoke. Avoid being around people who are smoking (secondhand smoke).  Keep all follow-up visits as told by your doctor. This is important. Contact a doctor if:  You have a fever.  Your symptoms get worse.  Your symptoms do not get better within 10 days. Get help right away if:  You have a very bad  headache.  You cannot stop throwing up (vomiting).  You have pain or swelling around your face or eyes.  You have trouble seeing.  You feel confused.  Your neck is stiff.  You have trouble breathing. This information is not intended to replace advice given to you by your health care provider. Make sure you discuss any questions you have with your health care provider. Document Released: 09/07/2007 Document Revised: 11/15/2015 Document Reviewed: 01/14/2015 Elsevier Interactive Patient Education  2018 Elsevier Inc.  Sinus Rinse What is a sinus rinse? A sinus rinse is a home treatment. It rinses your sinuses with a mixture of salt and water (saline solution). Sinuses are air-filled spaces in your skull behind the bones of your face and forehead. They open into your nasal cavity. To do a sinus rinse, you will need:  Saline solution.  Neti pot or spray bottle. This releases the saline solution into your nose and through your sinuses. You can buy neti pots and spray bottles at: ? Your local pharmacy. ? A health food store. ? Online.  When should I do a sinus rinse? A sinus rinse can help to clear your nasal cavity. It can clear:  Mucus.  Dirt.  Dust.  Pollen.  You may do a sinus rinse when you have:  A cold.  A virus.  Allergies.  A sinus infection.  A stuffy nose.  If you are considering a sinus rinse:  Ask   your child's doctor before doing a sinus rinse on your child.  Do not do a sinus rinse if you have had: ? Ear or nasal surgery. ? An ear infection. ? Blocked ears.  How do I do a sinus rinse?  Wash your hands.  Disinfect your device using the directions that came with the device.  Dry your device.  Use the solution that comes with your device or one that is sold separately in stores. Follow the mixing directions on the package.  Fill your device with the amount of saline solution as stated in the device instructions.  Stand over a sink and tilt  your head sideways over the sink.  Place the spout of the device in your upper nostril (the one closer to the ceiling).  Gently pour or squeeze the saline solution into the nasal cavity. The liquid should drain to the lower nostril if you are not too congested.  Gently blow your nose. Blowing too hard may cause ear pain.  Repeat in the other nostril.  Clean and rinse your device with clean water.  Air-dry your device. Are there risks of a sinus rinse? Sinus rinse is normally very safe and helpful. However, there are a few risks, which include:  A burning feeling in the sinuses. This may happen if you do not make the saline solution as instructed. Make sure to follow all directions when making the saline solution.  Infection from unclean water. This is rare, but possible.  Nasal irritation.  This information is not intended to replace advice given to you by your health care provider. Make sure you discuss any questions you have with your health care provider. Document Released: 10/16/2013 Document Revised: 02/16/2016 Document Reviewed: 08/06/2013 Elsevier Interactive Patient Education  2017 Elsevier Inc.  

## 2017-01-11 NOTE — Progress Notes (Signed)
   Subjective:    Patient ID: Elizabeth Copeland, female    DOB: [birth year], 46 y.o.   MRN: [id number]  HPI  46 year female comes in today because she is still not feeling well. I saw her about 2 weeks ago for what was seems consistent with allergic rhinitis symptoms. She was having a lot of eye itching and crusting irritation nasal congestion and cough. I put her on prednisone.the prednisone. She says the itching is better but still waking up with some crusting in the eyes. She has some nasal congestion but not a lot of discharge. And her cough is actually gotten worse. Mostly dry but still occasionally productive but she feels like it's coming from her sinuses. She's had some intermittent headaches but nothing persistent. She occasionally gets facial pain but only when her head hurts. Symptoms have been persistent for almost a month at this point. She is still on Zyrtec.   Review of Systems     Objective:   Physical Exam  Constitutional: She is oriented to person, place, and time. She appears well-developed and well-nourished.  HENT:  Head: Normocephalic and atraumatic.  Right Ear: External ear normal.  Left Ear: External ear normal.  Nose: Nose normal.  Mouth/Throat: Oropharynx is clear and moist.  TMs and canals are clear.   Eyes: Pupils are equal, round, and reactive to light. Conjunctivae and EOM are normal.  Neck: Neck supple. No thyromegaly present.  Cardiovascular: Normal rate, regular rhythm and normal heart sounds.   Pulmonary/Chest: Effort normal and breath sounds normal. She has no wheezes.  Lymphadenopathy:    She has no cervical adenopathy.  Neurological: She is alert and oriented to person, place, and time.  Skin: Skin is warm and dry.  Psychiatric: She has a normal mood and affect.          Assessment & Plan:  Acute sinusitis-we'll treat with amoxicillin. Not significantly better after one week them please let me know. Also recommend nasal saline irrigation and okay  to try a decongestant such as Sudafed. Continue Zyrtec.

## 2017-01-12 ENCOUNTER — Other Ambulatory Visit: Payer: Self-pay | Admitting: Sports Medicine

## 2017-01-12 DIAGNOSIS — M5416 Radiculopathy, lumbar region: Secondary | ICD-10-CM

## 2017-01-13 ENCOUNTER — Telehealth: Payer: Self-pay | Admitting: Sports Medicine

## 2017-01-13 DIAGNOSIS — M5416 Radiculopathy, lumbar region: Secondary | ICD-10-CM

## 2017-01-13 MED ORDER — GABAPENTIN 800 MG PO TABS: ORAL_TABLET | ORAL | 0 refills | Status: DC

## 2017-01-13 NOTE — Telephone Encounter (Signed)
Patient needs a refill for Gabapentin called into [facility] last sent in July and she had an appointment in Aug where u stated to keep taking Gabapentin but she is completely out can you please send today. Thanks

## 2017-01-18 ENCOUNTER — Ambulatory Visit (INDEPENDENT_AMBULATORY_CARE_PROVIDER_SITE_OTHER): Payer: BLUE CROSS/BLUE SHIELD | Admitting: Family Medicine

## 2017-01-18 VITALS — BP 142/75 | HR 88 | Ht 63.0 in | Wt 239.7 lb

## 2017-01-18 DIAGNOSIS — I10 Essential (primary) hypertension: Secondary | ICD-10-CM

## 2017-01-18 MED ORDER — LISINOPRIL 20 MG PO TABS: 20.0000 mg | ORAL_TABLET | Freq: Every day | ORAL | 0 refills | Status: DC

## 2017-01-18 NOTE — Progress Notes (Signed)
Agree with below. \Catherine Metheney, MD  

## 2017-01-18 NOTE — Progress Notes (Signed)
   Subjective:    Patient ID: Elizabeth Copeland, female    DOB: [birth year], 46 y.o.   MRN: [id number]  HPI Pt is here for a BP check. Pt denies chest pain, shortness of breath, palpitations, headaches, or any problems with medication.     Review of Systems     Objective:   Physical Exam        Assessment & Plan:  Pt advised to take Lisinopril 20 mg daily per Dr. Madilyn Fireman. Pt advised to schedule a follow-up with nurse in 3 weeks.

## 2017-02-08 ENCOUNTER — Ambulatory Visit (INDEPENDENT_AMBULATORY_CARE_PROVIDER_SITE_OTHER): Payer: BLUE CROSS/BLUE SHIELD | Admitting: Family Medicine

## 2017-02-08 VITALS — BP 132/71 | HR 84

## 2017-02-08 DIAGNOSIS — I10 Essential (primary) hypertension: Secondary | ICD-10-CM | POA: Diagnosis not present

## 2017-02-08 MED ORDER — LISINOPRIL-HYDROCHLOROTHIAZIDE 20-12.5 MG PO TABS: 1.0000 | ORAL_TABLET | Freq: Every day | ORAL | 0 refills | Status: DC

## 2017-02-08 NOTE — Progress Notes (Signed)
Hypertension-agree with above.  Beatrice Lecher, MD

## 2017-02-08 NOTE — Progress Notes (Signed)
Patient is here for blood pressure check. Denies any chest pain, palpitations, or any other medication problems. Pt brought in her home machine today so we could ensure it was accurate. Our machine read: 132/71 (84), her home machine read: 120/85 (87). Pt other home readings were: 129/90 (88), 129/101 (83), 118/88 (94), 141/82 (89), 137/83 (88).   Spoke with PCP, was advised to change Rx to lisinopril/HCTZ 20-12.5mg  daily. Pt to return for [place] BP check in 4 weeks. She will continue to take BP at home. No further questions.

## 2017-03-01 ENCOUNTER — Other Ambulatory Visit: Payer: Self-pay | Admitting: Sports Medicine

## 2017-03-01 DIAGNOSIS — M5416 Radiculopathy, lumbar region: Secondary | ICD-10-CM

## 2017-03-01 MED ORDER — GABAPENTIN 800 MG PO TABS: ORAL_TABLET | ORAL | 1 refills | Status: DC

## 2017-03-09 DIAGNOSIS — D485 Neoplasm of uncertain behavior of skin: Secondary | ICD-10-CM | POA: Diagnosis not present

## 2017-03-10 ENCOUNTER — Ambulatory Visit: Payer: BLUE CROSS/BLUE SHIELD

## 2017-04-03 ENCOUNTER — Encounter: Payer: Self-pay | Admitting: Sports Medicine

## 2017-04-13 ENCOUNTER — Telehealth: Payer: Self-pay | Admitting: Family Medicine

## 2017-04-13 DIAGNOSIS — F4321 Adjustment disorder with depressed mood: Secondary | ICD-10-CM

## 2017-04-13 NOTE — Telephone Encounter (Signed)
Patient came in with her daughter today.  She still dealing with losing her mother last year who died from cancer.  She admits she has not been taking the best care of herself.  I feel like she would benefit from some therapy/counseling.  Will refer to [facility] which is who her daughter sees when she is home from college.

## 2017-05-05 ENCOUNTER — Other Ambulatory Visit: Payer: Self-pay | Admitting: Family Medicine

## 2017-05-09 DIAGNOSIS — D23112 Other benign neoplasm of skin of right lower eyelid, including canthus: Secondary | ICD-10-CM | POA: Diagnosis not present

## 2017-05-09 DIAGNOSIS — D485 Neoplasm of uncertain behavior of skin: Secondary | ICD-10-CM | POA: Diagnosis not present

## 2017-05-09 DIAGNOSIS — D23122 Other benign neoplasm of skin of left lower eyelid, including canthus: Secondary | ICD-10-CM | POA: Diagnosis not present

## 2017-06-06 ENCOUNTER — Telehealth: Payer: Self-pay | Admitting: Family Medicine

## 2017-06-06 ENCOUNTER — Other Ambulatory Visit: Payer: Self-pay | Admitting: *Deleted

## 2017-06-06 DIAGNOSIS — I10 Essential (primary) hypertension: Secondary | ICD-10-CM

## 2017-06-06 MED ORDER — LISINOPRIL-HYDROCHLOROTHIAZIDE 20-12.5 MG PO TABS: 1.0000 | ORAL_TABLET | Freq: Every day | ORAL | 0 refills | Status: DC

## 2017-06-06 NOTE — Telephone Encounter (Signed)
Pt called. She needs a refill on heart meds. She has scheduled an appointment for March 20th.

## 2017-06-06 NOTE — Telephone Encounter (Signed)
Medication sent.Maryruth Eve, [place] Crocker, CMA

## 2017-06-08 ENCOUNTER — Encounter: Payer: Self-pay | Admitting: Family Medicine

## 2017-06-12 NOTE — Telephone Encounter (Signed)
Left message for a return call

## 2017-06-13 NOTE — Telephone Encounter (Signed)
She wants her daughter to be referred to a Psychologist, sport and exercise. She has gallbladder problems. I advised her mom to have her bring all the records so Dr Madilyn Fireman can review.

## 2017-06-13 NOTE — Telephone Encounter (Signed)
Spoke w/pt's mother and she stated that her daughter was dx with Mono ~2 mos ago and this is when all of this first started. She stated that she is in pain and it ranges between 4-9 depending on the day. Her BM's have not been normal and over the W/E she had been vomiting but was taking an antiemetic for this. She stated that they would like to remove her gallbladder (due to it not functioning) and she doesn't want this to be done while she is away at school, so she will bring in all of the scans tomorrow for Dr. Madilyn Fireman to review. She states that she is not due to come home until May. fwd to pcp for review.Elizabeth Copeland, [place]

## 2017-06-21 ENCOUNTER — Encounter: Payer: Self-pay | Admitting: Family Medicine

## 2017-06-21 ENCOUNTER — Ambulatory Visit: Payer: BLUE CROSS/BLUE SHIELD | Admitting: Family Medicine

## 2017-06-21 VITALS — BP 124/69 | HR 84 | Ht 63.0 in | Wt 240.0 lb

## 2017-06-21 DIAGNOSIS — R5383 Other fatigue: Secondary | ICD-10-CM | POA: Diagnosis not present

## 2017-06-21 DIAGNOSIS — M791 Myalgia, unspecified site: Secondary | ICD-10-CM

## 2017-06-21 DIAGNOSIS — M79672 Pain in left foot: Secondary | ICD-10-CM

## 2017-06-21 DIAGNOSIS — R0789 Other chest pain: Secondary | ICD-10-CM | POA: Diagnosis not present

## 2017-06-21 DIAGNOSIS — M79671 Pain in right foot: Secondary | ICD-10-CM

## 2017-06-21 DIAGNOSIS — M5416 Radiculopathy, lumbar region: Secondary | ICD-10-CM | POA: Diagnosis not present

## 2017-06-21 DIAGNOSIS — I10 Essential (primary) hypertension: Secondary | ICD-10-CM

## 2017-06-21 MED ORDER — LISINOPRIL-HYDROCHLOROTHIAZIDE 20-12.5 MG PO TABS: 1.0000 | ORAL_TABLET | Freq: Every day | ORAL | 1 refills | Status: DC

## 2017-06-21 NOTE — Progress Notes (Signed)
Subjective:    Patient ID: Elizabeth Copeland, female    DOB: 05-30-1970, 47 y.o.   MRN: [id number]  HPI Hypertension- Pt denies, SOB, dizziness, or heart palpitations.  Taking meds as directed w/o problems.  Denies medication side effects.  She has been having some intermittent chest pain that she is describes as "twinges" it seems to happen more in the afternoon and wonders if her blood pressure medication could actually be wearing off.  He wants to discuss that she has been extremely fatigued.  Also been having more joint and muscle problems and did have some questions about fibromyalgia.  She does have some chronic low back problems.  But more recently she is also been expensing bilateral heel pain.  She says it feels like it is a little bit more medial versus right on the bottom of the heel.  They are often in the morning she does have difficulty putting weight on her heels and it is difficult to walk initially but then it seems to get a little better.  No known injury or trauma.  She again is not sure if it is related to her back issues.  She also gets pain over that right lateral lower leg which she knows is related to her back and has been going on for quite some time.  He also reports feeling weak in both distal forearms.  Seems to be bilateral.   She has not been sleeping well either.  Is also really struggling with her weight and wants to get a better handle on this.  She does not feel like she eats a lot of food but just feels like that sometimes what she chooses to eat are not the best choices.  She has been working to try to really increase vegetables in her diet.  Review of Systems  BP 124/69   Pulse 84   Ht 5\' 3"  (1.6 m)   Wt 240 lb (108.9 kg)   SpO2 99%   BMI 42.51 kg/m     No Known Allergies  Past Medical History:  Diagnosis Date  . Anemia   . Back pain   . Complication of anesthesia    patient has not received anesthesia before but states father and brother have a  hard time waking up from anesthesia  . Family history of adverse reaction to anesthesia    mother "has a hard time waking up and moody"   . Fibroid   . Headache   . Morbid obesity ([place])   . Pneumonia     Past Surgical History:  Procedure Laterality Date  . CESAREAN SECTION  2000  . DILATION AND CURETTAGE OF UTERUS N/A 09/01/2015   Procedure: DILATATION AND CURETTAGE;  Surgeon: Emily Filbert, MD;  Location: [facility];  Service: Gynecology;  Laterality: N/A;  . IR GENERIC HISTORICAL  11/18/2015   IR US GUIDE VASC ACCESS RIGHT 11/18/2015 [facility]  . IR GENERIC HISTORICAL  11/18/2015   IR ANGIOGRAM SELECTIVE EACH ADDITIONAL VESSEL 11/18/2015 [facility]  . IR GENERIC HISTORICAL  11/18/2015   IR EMBO TUMOR ORGAN ISCHEMIA INFARCT INC GUIDE ROADMAPPING 11/18/2015 [facility]  . IR GENERIC HISTORICAL  11/18/2015   IR ANGIOGRAM PELVIS SELECTIVE OR SUPRASELECTIVE 11/18/2015 [facility]  . IR GENERIC HISTORICAL  11/18/2015   IR ANGIOGRAM SELECTIVE EACH ADDITIONAL VESSEL 11/18/2015 [facility]  . IR GENERIC HISTORICAL  11/18/2015   IR ANGIOGRAM PELVIS SELECTIVE OR SUPRASELECTIVE 11/18/2015 [facility]  . IR GENERIC HISTORICAL  02/23/2016  IR RADIOLOGIST EVAL & MGMT 02/23/2016 Jacqulynn Cadet, MD [facility]  . IR GENERIC HISTORICAL  12/02/2015   IR RADIOLOGIST EVAL & MGMT 12/02/2015 Greggory Keen, MD [facility]  . IR RADIOLOGIST EVAL & MGMT  08/24/2016  . WISDOM TOOTH EXTRACTION       Social History   Socioeconomic History  . Marital status: Single    Spouse name: Not on file  . Number of children: 1  . Years of education: 2 yr coll  . Highest education level: Not on file  Social Needs  . Financial resource strain: Not on file  . Food insecurity - worry: Not on file  . Food insecurity - inability: Not on file  . Transportation needs - medical: Not on file  . Transportation needs - non-medical: Not on file  Occupational History  . 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 Smoking status: Former Smoker    Packs/day: 0.50    Years: 15.00    Pack years: 7.50    Types: Cigarettes    Last attempt to quit: 04/20/2002    Years since quitting: 15.1  . Smokeless tobacco: Never Used  Substance and Sexual Activity  . Alcohol use: No  . Drug use: No  . Sexual activity: Not Currently    Birth control/protection: None  Other Topics Concern  . Not on file  Social History Narrative   No regular exercise.  Lives with her mother [name] and father Issis Lindseth    Family History  Problem Relation Age of Onset  . Hypertension Unknown   . Diabetes Father     Outpatient Encounter Medications as of 06/21/2017  Medication Sig  . gabapentin (NEURONTIN) 800 MG tablet TAKE 1 TABLET BY MOUTH IN THE MORNING, 1 TABLET MIDDAY, AND 2 TABLETS AT BEDTIME. Need follow up appointment.  Marland Kitchen lisinopril-hydrochlorothiazide (PRINZIDE,ZESTORETIC) 20-12.5 MG tablet Take 1 tablet by mouth daily.  . [DISCONTINUED] lisinopril-hydrochlorothiazide (PRINZIDE,ZESTORETIC) 20-12.5 MG tablet Take 1 tablet by mouth daily.   No facility-administered encounter medications on file as of 06/21/2017.     \    Objective:   Physical Exam  Constitutional: She is oriented to person, place, and time. She appears well-developed and well-nourished.  HENT:  Head: Normocephalic and atraumatic.  Cardiovascular: Normal rate, regular rhythm and normal heart sounds.  Pulmonary/Chest: Effort normal and breath sounds normal.  Neurological: She is alert and oriented to person, place, and time.  Skin: Skin is warm and dry.  Psychiatric: She has a normal mood and affect. Her behavior is normal.          Assessment & Plan:  HTN - Well controlled. Continue current regimen. Follow up in  6 months. Cue for CMP and lipids.   Atypical chest pain-I do not think this is cardiac related.  I really feel that it is more likely stress-induced.  We will  keep an eye on this and consider further workup if it is lasting longer or persists.  Fatigue think some of this could be emotional but I also think it could be coming from some of her excess weight and lack of exercise.  We are going to focus on this and help get it under better control.  In the meantime though we will check for thyroid disease and anemia.-  Bilateral heel pain-sounds like it could be plantar fasciitis that her pain is a little bit more medial.  I did not do an  exam today but recommended that she get in with 1 of our sports medicine providers for further treatment and evaluation.  Insomnia-could be related to her mood not being as well controlled.  Obesity-we discussed today that I really want to sit down with her have an appointment just to focus on a strategy around weight loss and set some goals for her.  I think this could in turn help her back as well as her joints in her feet.  It would help lower her blood pressure and I think it would greatly improve her mood as well.  She plans to come back in 1-2 weeks so that we can set some goals.  Myalgia-we will check some additional labs just to look for inflammation and potential autoimmune causes.  I do not think she would quite meet criteria for fibromyalgia at this point but we will keep an eye on it.

## 2017-06-24 LAB — ANA: ANA: NEGATIVE

## 2017-06-24 LAB — CBC
HEMATOCRIT: 40.8 % (ref 35.0–45.0)
HEMOGLOBIN: 13.1 g/dL (ref 11.7–15.5)
MCH: 24 pg — ABNORMAL LOW (ref 27.0–33.0)
MCHC: 32.1 g/dL (ref 32.0–36.0)
MCV: 74.7 fL — ABNORMAL LOW (ref 80.0–100.0)
MPV: 9.8 fL (ref 7.5–12.5)
Platelets: 396 10*3/uL (ref 140–400)
RBC: 5.46 10*6/uL — AB (ref 3.80–5.10)
RDW: 16.3 % — ABNORMAL HIGH (ref 11.0–15.0)
WBC: 10.5 10*3/uL (ref 3.8–10.8)

## 2017-06-24 LAB — COMPLETE METABOLIC PANEL WITH GFR
AG RATIO: 1.4 (calc) (ref 1.0–2.5)
ALT: 18 U/L (ref 6–29)
AST: 21 U/L (ref 10–35)
Albumin: 4.3 g/dL (ref 3.6–5.1)
Alkaline phosphatase (APISO): 70 U/L (ref 33–115)
BILIRUBIN TOTAL: 0.6 mg/dL (ref 0.2–1.2)
BUN: 11 mg/dL (ref 7–25)
CHLORIDE: 102 mmol/L (ref 98–110)
CO2: 27 mmol/L (ref 20–32)
Calcium: 9.8 mg/dL (ref 8.6–10.2)
Creat: 0.91 mg/dL (ref 0.50–1.10)
GFR, EST AFRICAN AMERICAN: 86 mL/min/{1.73_m2} (ref >=60)
GFR, Est Non African American: 75 mL/min/{1.73_m2} (ref >=60)
Globulin: 3.1 g/dL (calc) (ref 1.9–3.7)
Glucose, Bld: 93 mg/dL (ref 65–99)
POTASSIUM: 4.3 mmol/L (ref 3.5–5.3)
SODIUM: 136 mmol/L (ref 135–146)
TOTAL PROTEIN: 7.4 g/dL (ref 6.1–8.1)

## 2017-06-24 LAB — LIPID PANEL
Cholesterol: 246 mg/dL — ABNORMAL HIGH (ref <200)
HDL: 39 mg/dL — ABNORMAL LOW (ref >50)
LDL Cholesterol (Calc): 170 mg/dL (calc) — ABNORMAL HIGH
Non-HDL Cholesterol (Calc): 207 mg/dL (calc) — ABNORMAL HIGH (ref <130)
TRIGLYCERIDES: 207 mg/dL — AB (ref <150)
Total CHOL/HDL Ratio: 6.3 (calc) — ABNORMAL HIGH (ref <5.0)

## 2017-06-24 LAB — SEDIMENTATION RATE: SED RATE: 22 mm/h — AB (ref 0–20)

## 2017-06-24 LAB — C-REACTIVE PROTEIN: CRP: 17.7 mg/L — AB (ref <8.0)

## 2017-06-24 LAB — TSH: TSH: 1.39 mIU/L

## 2017-06-24 LAB — RHEUMATOID FACTOR: Rhuematoid fact SerPl-aCnc: <14 IU/mL (ref <14)

## 2017-06-28 ENCOUNTER — Encounter: Payer: Self-pay | Admitting: Family Medicine

## 2017-06-28 ENCOUNTER — Ambulatory Visit (INDEPENDENT_AMBULATORY_CARE_PROVIDER_SITE_OTHER): Payer: BLUE CROSS/BLUE SHIELD | Admitting: Family Medicine

## 2017-06-28 ENCOUNTER — Ambulatory Visit: Payer: BLUE CROSS/BLUE SHIELD | Admitting: Family Medicine

## 2017-06-28 ENCOUNTER — Other Ambulatory Visit: Payer: Self-pay

## 2017-06-28 VITALS — BP 116/67 | HR 82 | Ht 63.0 in | Wt 240.0 lb

## 2017-06-28 DIAGNOSIS — Z6841 Body Mass Index (BMI) 40.0 and over, adult: Secondary | ICD-10-CM

## 2017-06-28 DIAGNOSIS — K760 Fatty (change of) liver, not elsewhere classified: Secondary | ICD-10-CM | POA: Diagnosis not present

## 2017-06-28 DIAGNOSIS — M7061 Trochanteric bursitis, right hip: Secondary | ICD-10-CM | POA: Diagnosis not present

## 2017-06-28 DIAGNOSIS — M5416 Radiculopathy, lumbar region: Secondary | ICD-10-CM | POA: Diagnosis not present

## 2017-06-28 DIAGNOSIS — M722 Plantar fascial fibromatosis: Secondary | ICD-10-CM

## 2017-06-28 DIAGNOSIS — R635 Abnormal weight gain: Secondary | ICD-10-CM | POA: Diagnosis not present

## 2017-06-28 DIAGNOSIS — I10 Essential (primary) hypertension: Secondary | ICD-10-CM | POA: Diagnosis not present

## 2017-06-28 MED ORDER — INSULIN PEN NEEDLE 31G X 8 MM MISC: 99 refills | Status: DC

## 2017-06-28 MED ORDER — LORCASERIN HCL ER 20 MG PO TB24: 1.0000 | ORAL_TABLET | Freq: Every day | ORAL | 1 refills | Status: DC

## 2017-06-28 MED ORDER — LIRAGLUTIDE -WEIGHT MANAGEMENT 18 MG/3ML ~~LOC~~ SOPN
PEN_INJECTOR | SUBCUTANEOUS | 0 refills | Status: DC
Start: 2017-06-28 — End: 2017-07-22

## 2017-06-28 NOTE — Progress Notes (Signed)
Subjective:    Patient ID: Elizabeth Copeland, female    DOB: 1970/12/22, 47 y.o.   MRN: [id number]  HPI 47 year old female is here today to follow-up particularly on her weight.  When she came in about a week ago we had some long discussions about getting her blood pressure better controlled as well as her weight and working on improving her fatigue and myalgias.  She is very interested in trying to make some headway with her weight.  She admits that she has not had some good habits.  She tends to eat a diet high in carbs.  She struggles with eating sweets as well as pasta and bread.  In fact she has bread with every meal.  Also been doing some stress eating and because of body aches and joint pain has not been exercising.   Review of Systems BP 116/67   Pulse 82   Ht 5\' 3"  (1.6 m)   Wt 240 lb (108.9 kg)   SpO2 97%   BMI 42.51 kg/m     No Known Allergies  Past Medical History:  Diagnosis Date  . Anemia   . Back pain   . Complication of anesthesia    patient has not received anesthesia before but states father and brother have a hard time waking up from anesthesia  . Family history of adverse reaction to anesthesia    mother "has a hard time waking up and moody"   . Fibroid   . Headache   . Morbid obesity ([place])   . Pneumonia     Past Surgical History:  Procedure Laterality Date  . CESAREAN SECTION  2000  . DILATION AND CURETTAGE OF UTERUS N/A 09/01/2015   Procedure: DILATATION AND CURETTAGE;  Surgeon: Emily Filbert, MD;  Location: [facility];  Service: Gynecology;  Laterality: N/A;  . IR GENERIC HISTORICAL  11/18/2015   IR US GUIDE VASC ACCESS RIGHT 11/18/2015 [facility]  . IR GENERIC HISTORICAL  11/18/2015   IR ANGIOGRAM SELECTIVE EACH ADDITIONAL VESSEL 11/18/2015 [facility]  . IR GENERIC HISTORICAL  11/18/2015   IR EMBO TUMOR ORGAN ISCHEMIA INFARCT INC GUIDE ROADMAPPING 11/18/2015 [facility]  . IR GENERIC HISTORICAL  11/18/2015   IR ANGIOGRAM PELVIS SELECTIVE OR  SUPRASELECTIVE 11/18/2015 [facility]  . IR GENERIC HISTORICAL  11/18/2015   IR ANGIOGRAM SELECTIVE EACH ADDITIONAL VESSEL 11/18/2015 [facility]  . IR GENERIC HISTORICAL  11/18/2015   IR ANGIOGRAM PELVIS SELECTIVE OR SUPRASELECTIVE 11/18/2015 [facility]  . IR GENERIC HISTORICAL  02/23/2016   IR RADIOLOGIST EVAL & MGMT 02/23/2016 Jacqulynn Cadet, MD [facility]  . IR GENERIC HISTORICAL  12/02/2015   IR RADIOLOGIST EVAL & MGMT 12/02/2015 Greggory Keen, MD [facility]  . IR RADIOLOGIST EVAL & MGMT  08/24/2016  . WISDOM TOOTH EXTRACTION      Social History   Socioeconomic History  . Marital status: Single    Spouse name: Not on file  . Number of children: 1  . Years of education: 2 yr coll  . Highest education level: Not on file  Occupational History  . Occupation: member Therapist, occupational    Comment: Forensic scientist  Social Needs  . Financial resource strain: Not on file  . Food insecurity:    Worry: Not on file    Inability: Not on file  . Transportation needs:    Medical: Not on file    Non-medical: Not on file  Tobacco Use  . Smoking status: Former Smoker  Packs/day: 0.50    Years: 15.00    Pack years: 7.50    Types: Cigarettes    Last attempt to quit: 04/20/2002    Years since quitting: 15.2  . Smokeless tobacco: Never Used  Substance and Sexual Activity  . Alcohol use: No  . Drug use: No  . Sexual activity: Not Currently    Birth control/protection: None  Lifestyle  . Physical activity:    Days per week: Not on file    Minutes per session: Not on file  . Stress: Not on file  Relationships  . Social connections:    Talks on phone: Not on file    Gets together: Not on file    Attends religious service: Not on file    Active member of club or organization: Not on file    Attends meetings of clubs or organizations: Not on file    Relationship status: Not on file  . Intimate partner violence:    Fear of current or ex partner: Not on  file    Emotionally abused: Not on file    Physically abused: Not on file    Forced sexual activity: Not on file  Other Topics Concern  . Not on file  Social History Narrative   No regular exercise.  Lives with her mother Elizabeth Copeland and father Elizabeth Copeland    Family History  Problem Relation Age of Onset  . Hypertension Unknown   . Diabetes Father     Outpatient Encounter Medications as of 06/28/2017  Medication Sig  . gabapentin (NEURONTIN) 800 MG tablet TAKE 1 TABLET BY MOUTH IN THE MORNING, 1 TABLET MIDDAY, AND 2 TABLETS AT BEDTIME. Need follow up appointment.  Marland Kitchen lisinopril-hydrochlorothiazide (PRINZIDE,ZESTORETIC) 20-12.5 MG tablet Take 1 tablet by mouth daily.  . Liraglutide -Weight Management (SAXENDA) 18 MG/3ML SOPN Inject 0.6 mg into the skin daily for 7 days, THEN 1.2 mg daily for 7 days, THEN 1.8 mg daily for 14 days.  . [DISCONTINUED] Lorcaserin HCl ER (BELVIQ XR) 20 MG TB24 Take 1 tablet by mouth daily.   No facility-administered encounter medications on file as of 06/28/2017.          Objective:   Physical Exam  Constitutional: She is oriented to person, place, and time. She appears well-developed and well-nourished.  HENT:  Head: Normocephalic and atraumatic.  Eyes: Conjunctivae and EOM are normal.  Cardiovascular: Normal rate.  Pulmonary/Chest: Effort normal.  Neurological: She is alert and oriented to person, place, and time.  Skin: Skin is dry. No pallor.  Psychiatric: She has a normal mood and affect. Her behavior is normal.  Vitals reviewed.       Assessment & Plan:  Abnormal weight gain/BMI 42-morbid obesity with comorbidities of hypertension, fatty liver disease-we discussed some strategies and discussed setting goals for diet and exercise in addition to discussing medication options.  She is willing to try Sexenda.  Did warn about potential side effects.  Will need follow-up in 1 month to set new goals.  I think she would additionally benefit from  nutrition counseling as well and we can discuss further at next office visit.  This will improve her mood as well as her some of her joint pain she is been experiencing.  Current weight: 240 pounds Previous weight: 240 pounds Weight change: 0 pounds Dietary goals: Cut back on sugary sweet snacks and decrease bread to 1 meal a day versus all 3 meals. Exercise goal: Walk for 15-20 minutes 3 days/week Medication: We will  start Sexenda.  Discussed potential options and potential for nausea. Follow-up: 1 month  Time spent 30 min greater than 50% of time spent face-to-face discussing abnormal weight and strategies around weight loss.

## 2017-06-28 NOTE — Progress Notes (Signed)
Subjective:    I'm seeing this patient as a consultation for:  Hali Marry, MD   CC: Left heel pain  HPI: Ms Bossi notes a several week history of left plantar heel pain.  She cannot think of any injury or change in behavior.  She notes the pain is worse with standing from a seated position or getting out of bed in the morning.  The pain is located at the plantar medial calcaneus.  She has not had any treatment yet aside from her chronic gabapentin and ibuprofen which has not helped.  When asked about her history she notes that she has a history of right-sided lumbar radiculopathy that she is currently trying to save money to have an epidural injection.  She has pain radiating to the right lateral calf and dorsal and plantar foot.  She notes this pain has not changed recently  She does however note pain in the right lateral hip worse with standing from a seated position and lying on her right side.  This is been worsening over the past several months without injury.  When asked she thinks that she could be limping due to the right hip pain causing left foot pain.  Past medical history, Surgical history, Family history not pertinant except as noted below, Social history, Allergies, and medications have been entered into the medical record, reviewed, and no changes needed.   Review of Systems: No headache, visual changes, nausea, vomiting, diarrhea, constipation, dizziness, abdominal pain, skin rash, fevers, chills, night sweats, weight loss, swollen lymph nodes, body aches, joint swelling, muscle aches, chest pain, shortness of breath, mood changes, visual or auditory hallucinations.   Objective:    Vitals:   06/28/17 0845  BP: 116/67  Pulse: 82   General: Well Developed, well nourished, and in no acute distress.  Neuro/Psych: Alert and oriented x3, extra-ocular muscles intact, able to move all 4 extremities, sensation grossly intact. Skin: Warm and dry, no rashes noted.    Respiratory: Not using accessory muscles, speaking in full sentences, trachea midline.  Cardiovascular: Pulses palpable, no extremity edema. Abdomen: Does not appear distended. MSK:  L-spine: Nontender to midline normal motion Right hip: Normal-appearing, normal motion tender to palpation greater trochanter. Hip abduction strength diminished 4/5 with pain  Left foot: Normal-appearing Foot and ankle motion are normal Tender to palpation medial plantar calcaneus Pulses capillary refill and sensation are intact.  EXAM: MRI LUMBAR SPINE WITHOUT CONTRAST  TECHNIQUE: Multiplanar, multisequence MR imaging of the lumbar spine was performed. No intravenous contrast was administered.  COMPARISON:  CT abdomen 09/01/2016.  Lumbar MRI 04/23/2013.  FINDINGS: Segmentation:  5 lumbar type vertebral bodies.  Alignment:  Normal  Vertebrae: Scattered benign appearing hemangiomas. No fracture or significant bone finding.  Conus medullaris: Extends to the L1 level and appears normal.  Paraspinal and other soft tissues: Negative except for simple renal cysts.  Disc levels:  T12-L1:  Normal.  L1-2:  Minimal desiccation and bulging of the disc.  No stenosis.  L2-3:  Mild desiccation and bulging of the disc.  No stenosis.  L3-4: Normal appearance of the disc. Minimal facet hypertrophy. No stenosis.  L4-5: Mild bulging of the disc. Left foraminal to extraforaminal annular fissure and annular bulge. Mild facet hypertrophy. Mild narrowing of the lateral recesses and foramina but without visible neural compression. Left L4 nerve root irritation would be possible based on the annular fissure.  L5-S1: Cyst desiccation with annular fissures an annular bulging. No frank herniation. Mild facet hypertrophy.  Mild narrowing of the subarticular lateral recesses without compressive stenosis.  Compared to the previous study, the findings are unchanged.  IMPRESSION: Mild,  non-compressive disc bulges at L1-2, L2-3 and L3-4.  L4-5 disc desiccation and bulging. Left foraminal to extraforaminal annular fissure and annular bulge neural compression is not demonstrated, but this would have potential to irritate the left L4 nerve. No apparent change since 2015 however.  L5-S1 disc degeneration and bulging with annular fissures. Mild facet hypertrophy. Mild narrowing of the subarticular lateral recesses without visible neural compression. Similarly this is unchanged since 2015. Though definite neural compression is not demonstrated, neural irritation could occur.   Electronically Signed   By: Nelson Chimes M.D.   On: 11/19/2016 18:11 I personally (independently) visualized and performed the interpretation of the images attached in this note.  No results found for this or any previous visit (from the past 24 hour(s)). No results found.  Impression and Recommendations:    Assessment and Plan: 47 y.o. female with  Left foot pain is plantar fasciitis.  Plan for gel heel cups eccentric exercises and ice massage.  Recheck in 6 weeks if not better..  Additionally patient has new right lateral hip pain that is greater trochanteric bursitis versus hip abductor tendinitis.  I think this may be the fundamental underlying reason why she developed left-sided plantar fasciitis.  We discussed the diagnosis and plan for stretching and strengthening program.  Again recheck in 6 weeks if not better.  Consider formal physical therapy.  Additionally we discussed right-sided lumbar radiculopathy.  I recommend epidural steroid injection when she can get it.   No orders of the defined types were placed in this encounter.  No orders of the defined types were placed in this encounter.   Discussed warning signs or symptoms. Please see discharge instructions. Patient expresses understanding.

## 2017-06-28 NOTE — Patient Instructions (Signed)
Thank you for coming in today. I think you have plantar fasciitis.  You also have trochanter bursitis in the hip.   Work on the [employer] on the foot.  Work on the heel calf exercises.  Remember to go down slowly.  Use the gell heel cups as needed.   For the hip do the stretch and side leg exercises.  Remember to go slowly and make sure your pelvis is not rocking back.   Recheck in 4-6 weeks if not better.    Plantar Fasciitis Rehab Ask your health care provider which exercises are safe for you. Do exercises exactly as told by your health care provider and adjust them as directed. It is normal to feel mild stretching, pulling, tightness, or discomfort as you do these exercises, but you should stop right away if you feel sudden pain or your pain gets worse. Do not begin these exercises until told by your health care provider. Stretching and range of motion exercises These exercises warm up your muscles and joints and improve the movement and flexibility of your foot. These exercises also help to relieve pain. Exercise A: Plantar fascia stretch  1. Sit with your left / right leg crossed over your opposite knee. 2. Hold your heel with one hand with that thumb near your arch. With your other hand, hold your toes and gently pull them back toward the top of your foot. You should feel a stretch on the bottom of your toes or your foot or both. 3. Hold this stretch for__________ seconds. 4. Slowly release your toes and return to the starting position. Repeat __________ times. Complete this exercise __________ times a day. Exercise B: Gastroc, standing  1. Stand with your hands against a wall. 2. Extend your left / right leg behind you, and bend your front knee slightly. 3. Keeping your heels on the floor and keeping your back knee straight, shift your weight toward the wall without arching your back. You should feel a gentle stretch in your left / right calf. 4. Hold this position for  __________ seconds. Repeat __________ times. Complete this exercise __________ times a day. Exercise C: Soleus, standing 1. Stand with your hands against a wall. 2. Extend your left / right leg behind you, and bend your front knee slightly. 3. Keeping your heels on the floor, bend your back knee and slightly shift your weight over the back leg. You should feel a gentle stretch deep in your calf. 4. Hold this position for __________ seconds. Repeat __________ times. Complete this exercise __________ times a day. Exercise D: Gastrocsoleus, standing 1. Stand with the ball of your left / right foot on a step. The ball of your foot is on the walking surface, right under your toes. 2. Keep your other foot firmly on the same step. 3. Hold onto the wall or a railing for balance. 4. Slowly lift your other foot, allowing your body weight to press your heel down over the edge of the step. You should feel a stretch in your left / right calf. 5. Hold this position for __________ seconds. 6. Return both feet to the step. 7. Repeat this exercise with a slight bend in your left / right knee. Repeat __________ times with your left / right knee straight and __________ times with your left / right knee bent. Complete this exercise __________ times a day. Balance exercise This exercise builds your balance and strength control of your arch to help take pressure off your plantar  fascia. Exercise E: Single leg stand 1. Without shoes, stand near a railing or in a doorway. You may hold onto the railing or door frame as needed. 2. Stand on your left / right foot. Keep your big toe down on the floor and try to keep your arch lifted. Do not let your foot roll inward. 3. Hold this position for __________ seconds. 4. If this exercise is too easy, you can try it with your eyes closed or while standing on a pillow. Repeat __________ times. Complete this exercise __________ times a day. This information is not intended to  replace advice given to you by your health care provider. Make sure you discuss any questions you have with your health care provider. Document Released: 03/21/2005 Document Revised: 11/24/2015 Document Reviewed: 02/02/2015 Elsevier Interactive Patient Education  2018 [place].    Hip Bursitis Hip bursitis is inflammation of a fluid-filled sac (bursa) in the hip joint. The bursa protects the bones in the hip joint from rubbing against each other. Hip bursitis can cause mild to moderate pain, and symptoms often come and go over time. What are the causes? This condition may be caused by:  Injury to the hip.  Overuse of the muscles that surround the hip joint.  Arthritis or gout.  Diabetes.  Thyroid disease.  Cold weather.  Infection.  In some cases, the cause may not be known. What are the signs or symptoms? Symptoms of this condition may include:  Mild or moderate pain in the hip area. Pain may get worse with movement.  Tenderness and swelling of the hip, especially on the outer side of the hip.  Symptoms may come and go. If the bursa becomes infected, you may have the following symptoms:  Fever.  Red skin and a feeling of warmth in the hip area.  How is this diagnosed? This condition may be diagnosed based on:  A physical exam.  Your medical history.  X-rays.  Removal of fluid from your inflamed bursa for testing (biopsy).  You may be sent to a health care provider who specializes in bone diseases (orthopedist) or a provider who specializes in joint inflammation (rheumatologist). How is this treated? This condition is treated by resting, raising (elevating), and applying pressure(compression) to the injured area. In some cases, this may be enough to make your symptoms go away. Treatment may also include:  Crutches.  Antibiotic medicine.  Draining fluid out of the bursa to help relieve swelling.  Injecting medicine that helps to reduce inflammation  (cortisone).  Follow these instructions at home: Medicines  Take over-the-counter and prescription medicines only as told by your health care provider.  Do not drive or operate heavy machinery while taking prescription pain medicine, or as told by your health care provider.  If you were prescribed an antibiotic, take it as told by your health care provider. Do not stop taking the antibiotic even if you start to feel better. Activity  Return to your normal activities as told by your health care provider. Ask your health care provider what activities are safe for you.  Rest and protect your hip as much as possible until your pain and swelling get better. General instructions  Wear compression wraps only as told by your health care provider.  Elevate your hip above the level of your heart as much as you can without pain. To do this, try putting a pillow under your hips while you lie down.  Do not use your hip to support your  body weight until your health care provider says that you can. Use crutches as told by your health care provider.  Gently massage and stretch your injured area as often as is comfortable.  Keep all follow-up visits as told by your health care provider. This is important. How is this prevented?  Exercise regularly, as told by your health care provider.  Warm up and stretch before being active.  Cool down and stretch after being active.  If an activity irritates your hip or causes pain, avoid the activity as much as possible.  Avoid sitting down for long periods at a time. Contact a health care provider if:  You have a fever.  You develop new symptoms.  You have difficulty walking or doing everyday activities.  You have pain that gets worse or does not get better with medicine.  You develop red skin or a feeling of warmth in your hip area. Get help right away if:  You cannot move your hip.  You have severe pain. This information is not intended to  replace advice given to you by your health care provider. Make sure you discuss any questions you have with your health care provider. Document Released: 09/10/2001 Document Revised: 08/27/2015 Document Reviewed: 10/21/2014 Elsevier Interactive Patient Education  [employer].

## 2017-07-22 ENCOUNTER — Other Ambulatory Visit: Payer: Self-pay | Admitting: Family Medicine

## 2017-07-22 DIAGNOSIS — Z6841 Body Mass Index (BMI) 40.0 and over, adult: Secondary | ICD-10-CM

## 2017-07-24 NOTE — Telephone Encounter (Signed)
Does her dosage need to go to 2.4 mg or does it just jump to 3 mg? Wasn't sure and didn't want to screw this up. She has f/u appt on 07/26/17 with you. Will fwd to Dr. Madilyn Fireman for dosing.Elizabeth Copeland, [place] Crocker, CMA

## 2017-07-26 ENCOUNTER — Ambulatory Visit: Payer: BLUE CROSS/BLUE SHIELD | Admitting: Family Medicine

## 2017-07-26 ENCOUNTER — Encounter: Payer: Self-pay | Admitting: Family Medicine

## 2017-07-26 VITALS — BP 128/79 | HR 83 | Ht 63.0 in | Wt 235.0 lb

## 2017-07-26 DIAGNOSIS — I10 Essential (primary) hypertension: Secondary | ICD-10-CM

## 2017-07-26 DIAGNOSIS — R635 Abnormal weight gain: Secondary | ICD-10-CM | POA: Diagnosis not present

## 2017-07-26 DIAGNOSIS — Z6841 Body Mass Index (BMI) 40.0 and over, adult: Secondary | ICD-10-CM | POA: Diagnosis not present

## 2017-07-26 DIAGNOSIS — K760 Fatty (change of) liver, not elsewhere classified: Secondary | ICD-10-CM

## 2017-07-26 NOTE — Patient Instructions (Addendum)
Current weight: 235 pounds Previous weight: 240 pounds Weight change: Down 5 pounds Dietary goals: Continue to cut down on sugary sweet snacks and decrease bread to 1 meal a day versus all 3 meals. Work on Geophysicist/field seismologist in her diet.   Exercise goal: Walk for 15-20 minutes 3 days/week or gym cardio work out.  Medication: Increase to  Saxenda 2.4 mg.  Follow-up: 1 month

## 2017-07-26 NOTE — Progress Notes (Signed)
   Subjective:    Patient ID: Elizabeth Copeland, female    DOB: 1971-01-19, 47 y.o.   MRN: [id number]  HPI  47 year old female with a BMI greater than 40,  fatty liver, HTN, and joint pain comes in today to follow-up on weight management.  She is actually doing really well in the [place] got up to 1.8 mg daily.  She never experienced any nausea.  She noticed a big difference and feeling more full and also decreasing her cravings for sugar.  She has really cut back on her sugar intake she is had a few sweet things but overall is been much better.  She is also no longer eating croissant from [employer] every morning and has switched to special care in the morning.  She had a significant decrease in her appetite around her evening meal and sometimes will skip it.  Still struggling with increasing her vegetable intake.  She is having a hard time walking consistently so plans on getting back into the gym.  She already has a Higher education careers adviser.  Review of Systems     Objective:   Physical Exam  Constitutional: She is oriented to person, place, and time. She appears well-developed and well-nourished.  HENT:  Head: Normocephalic and atraumatic.  Cardiovascular: Normal rate, regular rhythm and normal heart sounds.  Pulmonary/Chest: Effort normal and breath sounds normal.  Neurological: She is alert and oriented to person, place, and time.  Skin: Skin is warm and dry.  Psychiatric: She has a normal mood and affect. Her behavior is normal.          Assessment & Plan:  Abnormal weight gain/BMI -is down 5 pounds and doing great.  She still eating a fairly carb heavy diet as she struggles to really get vegetables that she likes.  She is drinking more water which is great.  She is not eating bread with every meal anymore.    I think she would additionally benefit from nutrition counseling as well and we can discuss further at next office visit.  This will improve her mood as well as her some of her joint pain she  is been experiencing.  Current weight: 235 pounds Previous weight: 240 pounds Weight change: Down 5 pounds Dietary goals: Continue to cut down on sugary sweet snacks and decrease bread to 1 meal a day versus all 3 meals. Work on Geophysicist/field seismologist in her diet.   Exercise goal: Walk for 15-20 minutes 3 days/week or gym cardio work out.  Medication: Increase to  Saxenda 2.4 mg.  Follow-up: 1 month  Time spent 20 min greater than 50% of time spent face-to-face discussing abnormal weight and strategies around weight loss.

## 2017-08-30 ENCOUNTER — Encounter: Payer: Self-pay | Admitting: Family Medicine

## 2017-08-30 ENCOUNTER — Ambulatory Visit: Payer: BLUE CROSS/BLUE SHIELD | Admitting: Family Medicine

## 2017-08-30 VITALS — BP 116/67 | HR 89 | Ht 63.0 in | Wt 235.0 lb

## 2017-08-30 DIAGNOSIS — Z6841 Body Mass Index (BMI) 40.0 and over, adult: Secondary | ICD-10-CM

## 2017-08-30 DIAGNOSIS — R635 Abnormal weight gain: Secondary | ICD-10-CM

## 2017-08-30 MED ORDER — LIRAGLUTIDE -WEIGHT MANAGEMENT 18 MG/3ML ~~LOC~~ SOPN: 3.0000 mg | PEN_INJECTOR | Freq: Every day | SUBCUTANEOUS | 0 refills | Status: DC

## 2017-08-30 NOTE — Progress Notes (Signed)
   Subjective:    Patient ID: Elizabeth Copeland, female    DOB: 13-Aug-1970, 47 y.o.   MRN: [id number]  HPI 47 year old female with a BMI greater than 40, fatty liver, HTN, and joint pain   comes in today to follow-up for abnormal weight gain.  She has not gained or lost any weight since I last saw her.  Still at 235 pounds.  She says this is been a really stressful month for her.  Her father who lives with her had dental surgery and her daughter just had her gallbladder removed.  She is feels like she has not been coping quite as well and has been stressed eating again.  She is also been dealing with her hip bursitis which is made it more difficult for her to walk.  She can walk for short distance but if she does too much then it really flares and bothers her.  She has worked hard to get her bread intake down to 1 meal per day.  She did check on the nutrition counseling unfortunately the first visit to cost $350 so she wants to see if they may have something through her work that she may be able to access   Review of Systems     Objective:   Physical Exam  Constitutional: She is oriented to person, place, and time. She appears well-developed and well-nourished.  HENT:  Head: Normocephalic and atraumatic.  Cardiovascular: Normal rate, regular rhythm and normal heart sounds.  Pulmonary/Chest: Effort normal and breath sounds normal.  Neurological: She is alert and oriented to person, place, and time.  Skin: Skin is warm and dry.  Psychiatric: She has a normal mood and affect. Her behavior is normal.       Assessment & Plan:  Abnormal weight gain/BMI -no weight changes.  She still eating a fairly carb heavy diet as she struggles to really get vegetables that she likes.  She is drinking more water which is great.  She is not eating bread with every meal anymore.    I think she would additionally benefit from nutrition counseling as well and we can discuss further at next office visit. This will  improve her mood as well as her some of her joint pain she is been experiencing.  Current weight:235 pounds Previous weight:235 pounds Weight change:Down 0 pounds Dietary goals:Continue to cut down on sugary sweet snacks and decrease bread to 1 meal a day versus all 3 meals. Work on Geophysicist/field seismologist in her diet.   Exercise goal: Walk for15-20 minutes 3 days/week or gym cardio work out.  Medication: Increase to Saxenda 3.0 mg.  Follow-up:1 month with nurse.   Time spent 20 mingreater than 50% of time spent face-to-face discussing abnormal weight and strategies around weight loss.

## 2017-08-30 NOTE — Patient Instructions (Addendum)
Current weight:235 pounds Previous weight:235 pounds Weight change:Down 0 pounds Dietary goals:Continue to cut down on sugary sweet snacks and decrease bread to 1 meal a day versus all 3 meals. Work on Geophysicist/field seismologist in her diet.   Exercise goal: Walk for15-20 minutes 3 days/week or gym cardio work out.  Medication: Increase to Saxenda 3.0 mg. Check on nutrition counseling through work.  Follow-up:1 month with nurse.

## 2017-09-14 ENCOUNTER — Telehealth: Payer: Self-pay

## 2017-09-14 NOTE — Telephone Encounter (Signed)
Pt stated that you wanted her to get shot In her lower back couple months ago. She doesn't  Remember name of place or phone number, and wold like to know. She was also diagnosed by Georgina Snell (she doesn't remember his name) wants you to call her about the diagnosis he gave her. W.Kimberleigh Mehan, CCMA

## 2017-09-15 NOTE — Telephone Encounter (Signed)
She has lumbar radiculopathy, we had wanted her to get a lumbar epidural, specifically a selective S1 epidural at [facility] on [address], she can call them to schedule.  Also looks like Dr. Georgina Snell diagnosed her with plantar fasciitis, as well as trochanteric bursitis.

## 2017-09-20 ENCOUNTER — Ambulatory Visit: Payer: BLUE CROSS/BLUE SHIELD | Admitting: Sports Medicine

## 2017-09-20 ENCOUNTER — Encounter: Payer: Self-pay | Admitting: Sports Medicine

## 2017-09-20 DIAGNOSIS — M5416 Radiculopathy, lumbar region: Secondary | ICD-10-CM | POA: Diagnosis not present

## 2017-09-20 MED ORDER — DIAZEPAM 10 MG PO TABS: ORAL_TABLET | ORAL | 0 refills | Status: DC

## 2017-09-20 NOTE — Progress Notes (Signed)
Subjective:    CC: Low back pain  HPI: Elizabeth Copeland is a pleasant 47 year old female, she returns with persistence of Copeland right-sided low back and buttock pain with radiation down the right leg to the foot.  Numbness and tingling on the dorsum and plantar aspect of the foot, with some paresthesias into the little toe as well as the second through fourth toes.  Worse when sitting, with flexion, with Valsalva, no bowel or bladder dysfunction, saddle numbness, constitutional symptoms, moderate, persistent.  I reviewed the past medical history, family history, social history, surgical history, and allergies today and no changes were needed.  Please see the problem list section below in epic for further details.  Past Medical History: Past Medical History:  Diagnosis Date  . Anemia   . Back pain   . Complication of anesthesia    patient has not received anesthesia before but states father and brother have a hard time waking up from anesthesia  . Family history of adverse reaction to anesthesia    mother "has a hard time waking up and moody"   . Fibroid   . Headache   . Morbid obesity ([place])   . Pneumonia    Past Surgical History: Past Surgical History:  Procedure Laterality Date  . CESAREAN SECTION  2000  . DILATION AND CURETTAGE OF UTERUS N/A 09/01/2015   Procedure: DILATATION AND CURETTAGE;  Surgeon: Elizabeth Filbert, MD;  Location: [facility];  Service: Gynecology;  Laterality: N/A;  . IR GENERIC HISTORICAL  11/18/2015   IR US GUIDE VASC ACCESS RIGHT 11/18/2015 [facility]  . IR GENERIC HISTORICAL  11/18/2015   IR ANGIOGRAM SELECTIVE EACH ADDITIONAL VESSEL 11/18/2015 [facility]  . IR GENERIC HISTORICAL  11/18/2015   IR EMBO TUMOR ORGAN ISCHEMIA INFARCT INC GUIDE ROADMAPPING 11/18/2015 [facility]  . IR GENERIC HISTORICAL  11/18/2015   IR ANGIOGRAM PELVIS SELECTIVE OR SUPRASELECTIVE 11/18/2015 [facility]  . IR GENERIC HISTORICAL  11/18/2015   IR ANGIOGRAM SELECTIVE EACH ADDITIONAL VESSEL  11/18/2015 [facility]  . IR GENERIC HISTORICAL  11/18/2015   IR ANGIOGRAM PELVIS SELECTIVE OR SUPRASELECTIVE 11/18/2015 [facility]  . IR GENERIC HISTORICAL  02/23/2016   IR RADIOLOGIST EVAL & MGMT 02/23/2016 Elizabeth Cadet, MD [facility]  . IR GENERIC HISTORICAL  12/02/2015   IR RADIOLOGIST EVAL & MGMT 12/02/2015 Elizabeth Keen, MD [facility]  . IR RADIOLOGIST EVAL & MGMT  08/24/2016  . WISDOM TOOTH EXTRACTION     Social History: Social History   Socioeconomic History  . Marital status: Single    Spouse name: Not on file  . Number of children: 1  . Years of education: 2 yr coll  . Highest education level: Not on file  Occupational History  . Occupation: member Therapist, occupational    Comment: Forensic scientist  Social Needs  . Financial resource strain: Not on file  . Food insecurity:    Worry: Not on file    Inability: Not on file  . Transportation needs:    Medical: Not on file    Non-medical: Not on file  Tobacco Use  . Smoking status: Former Smoker    Packs/day: 0.50    Years: 15.00    Pack years: 7.50    Types: Cigarettes    Last attempt to quit: 04/20/2002    Years since quitting: 15.4  . Smokeless tobacco: Never Used  Substance and Sexual Activity  . Alcohol use: No  . Drug use: No  . Sexual  activity: Not Currently    Birth control/protection: None  Lifestyle  . Physical activity:    Days per week: Not on file    Minutes per session: Not on file  . Stress: Not on file  Relationships  . Social connections:    Talks on phone: Not on file    Gets together: Not on file    Attends religious service: Not on file    Active member of club or organization: Not on file    Attends meetings of clubs or organizations: Not on file    Relationship status: Not on file  Other Topics Concern  . Not on file  Social History Narrative   No regular exercise.  Lives with Copeland mother Elizabeth Copeland and father Elizabeth Copeland   Family History: Family History    Problem Relation Age of Onset  . Hypertension Unknown   . Diabetes Father    Allergies: No Known Allergies Medications: See med rec.  Review of Systems: No fevers, chills, night sweats, weight loss, chest pain, or shortness of breath.   Objective:    General: Well Developed, well nourished, and in no acute distress.  Neuro: Alert and oriented x3, extra-ocular muscles intact, sensation grossly intact.  HEENT: Normocephalic, atraumatic, pupils equal round reactive to light, neck supple, no masses, no lymphadenopathy, thyroid nonpalpable.  Skin: Warm and dry, no rashes. Cardiac: Regular rate and rhythm, no murmurs rubs or gallops, no lower extremity edema.  Respiratory: Clear to auscultation bilaterally. Not using accessory muscles, speaking in full sentences.  MRI again personally reviewed, there is a broad-based protruding disc at L5-S1 that could affect the far lateral L5 and the intraspinal S1 nerve roots on the right.  Impression and Recommendations:    Right lumbar radiculitis Recurrence of right buttock pain with radiation down the right leg in an L5 and S1 distribution. Based on Copeland MRI from 2018 it is possible that Copeland L5-S1 disc contacts the far lateral L5 nerve root in the intraspinal S1 nerve root on the right. Proceeding with a right L5-S1 interlaminar epidural, return to see me 1 month after the injection to evaluate response, Valium 2 hours before procedure for anxiolysis. ___________________________________________ Elizabeth Copeland. Elizabeth Copeland, M.D., ABFM., CAQSM. Primary Care and Belt Instructor of [employer] [facility]

## 2017-09-20 NOTE — Assessment & Plan Note (Signed)
Recurrence of right buttock pain with radiation down the right leg in an L5 and S1 distribution. Based on her MRI from 2018 it is possible that her L5-S1 disc contacts the far lateral L5 nerve root in the intraspinal S1 nerve root on the right. Proceeding with a right L5-S1 interlaminar epidural, return to see me 1 month after the injection to evaluate response, Valium 2 hours before procedure for anxiolysis.

## 2017-09-27 ENCOUNTER — Ambulatory Visit (INDEPENDENT_AMBULATORY_CARE_PROVIDER_SITE_OTHER): Payer: BLUE CROSS/BLUE SHIELD | Admitting: Family Medicine

## 2017-09-27 VITALS — BP 123/71 | HR 90 | Wt 229.0 lb

## 2017-09-27 DIAGNOSIS — Z6841 Body Mass Index (BMI) 40.0 and over, adult: Secondary | ICD-10-CM

## 2017-09-27 DIAGNOSIS — R635 Abnormal weight gain: Secondary | ICD-10-CM

## 2017-09-27 MED ORDER — LIRAGLUTIDE -WEIGHT MANAGEMENT 18 MG/3ML ~~LOC~~ SOPN: 3.0000 mg | PEN_INJECTOR | Freq: Every day | SUBCUTANEOUS | 0 refills | Status: DC

## 2017-09-27 NOTE — Progress Notes (Signed)
Patient is here for blood pressure and weight check. Denies any trouble sleeping, palpitations, or any other medication problems. Pt denies any nausea but does report a localized rash at injection site. Patient reports this never happened until she increased to 3mg  daily dose. Patient has lost weight. She reports she has not been exercising very much due to back pain, and has cut down her "bread intake" to 1-2 servings daily. A refill for Saxenda will be sent to Provider for review. Patient advised I would call her with PCP recommendation. Verbalized understanding, no further questions.

## 2017-09-27 NOTE — Progress Notes (Signed)
Abnormal weight gain/BMI 40-she is actually down 2 pounds from last office visit.  Continue to work on dietary changes and trying to increase activity on days where she feels like she can.  Agree with documentation as above.   Beatrice Lecher, MD

## 2017-09-28 ENCOUNTER — Encounter: Payer: Self-pay | Admitting: Family Medicine

## 2017-10-02 ENCOUNTER — Other Ambulatory Visit: Payer: Self-pay | Admitting: Sports Medicine

## 2017-10-02 DIAGNOSIS — M5416 Radiculopathy, lumbar region: Secondary | ICD-10-CM

## 2017-10-04 ENCOUNTER — Other Ambulatory Visit: Payer: BLUE CROSS/BLUE SHIELD

## 2017-10-11 ENCOUNTER — Ambulatory Visit
Admission: RE | Admit: 2017-10-11 | Discharge: 2017-10-11 | Disposition: A | Discharge status: Home / Self Care | Payer: BLUE CROSS/BLUE SHIELD | Source: Ambulatory Visit | Attending: Sports Medicine | Admitting: Sports Medicine

## 2017-10-11 DIAGNOSIS — M47817 Spondylosis without myelopathy or radiculopathy, lumbosacral region: Secondary | ICD-10-CM | POA: Diagnosis not present

## 2017-10-11 MED ORDER — IOPAMIDOL (ISOVUE-M 200) INJECTION 41%: 1.0000 mL | Freq: Once | INTRAMUSCULAR | Status: DC

## 2017-10-11 MED ORDER — METHYLPREDNISOLONE ACETATE 40 MG/ML INJ SUSP (RADIOLOG: 120.0000 mg | Freq: Once | INTRAMUSCULAR | Status: DC

## 2017-10-11 NOTE — Discharge Instructions (Signed)

## 2017-10-27 ENCOUNTER — Ambulatory Visit: Payer: BLUE CROSS/BLUE SHIELD | Admitting: Sports Medicine

## 2017-10-27 ENCOUNTER — Encounter: Payer: Self-pay | Admitting: Sports Medicine

## 2017-10-27 DIAGNOSIS — M5416 Radiculopathy, lumbar region: Secondary | ICD-10-CM | POA: Diagnosis not present

## 2017-10-27 MED ORDER — LORAZEPAM 2 MG PO TABS: ORAL_TABLET | ORAL | 0 refills | Status: DC

## 2017-10-27 NOTE — Progress Notes (Signed)
Subjective:    CC: Follow-up  HPI: This is a pleasant 47 year old female with right L5 lumbar radiculopathy, she responded well but only partially to her right L5-S1 interlaminar epidural, she is here for further evaluation and definitive treatment.  I reviewed the past medical history, family history, social history, surgical history, and allergies today and no changes were needed.  Please see the problem list section below in epic for further details.  Past Medical History: Past Medical History:  Diagnosis Date  . Anemia   . Back pain   . Complication of anesthesia    patient has not received anesthesia before but states father and brother have a hard time waking up from anesthesia  . Family history of adverse reaction to anesthesia    mother "has a hard time waking up and moody"   . Fibroid   . Headache   . Morbid obesity ([place])   . Pneumonia    Past Surgical History: Past Surgical History:  Procedure Laterality Date  . CESAREAN SECTION  2000  . DILATION AND CURETTAGE OF UTERUS N/A 09/01/2015   Procedure: DILATATION AND CURETTAGE;  Surgeon: Emily Filbert, MD;  Location: [facility];  Service: Gynecology;  Laterality: N/A;  . IR GENERIC HISTORICAL  11/18/2015   IR US GUIDE VASC ACCESS RIGHT 11/18/2015 [facility]  . IR GENERIC HISTORICAL  11/18/2015   IR ANGIOGRAM SELECTIVE EACH ADDITIONAL VESSEL 11/18/2015 [facility]  . IR GENERIC HISTORICAL  11/18/2015   IR EMBO TUMOR ORGAN ISCHEMIA INFARCT INC GUIDE ROADMAPPING 11/18/2015 [facility]  . IR GENERIC HISTORICAL  11/18/2015   IR ANGIOGRAM PELVIS SELECTIVE OR SUPRASELECTIVE 11/18/2015 [facility]  . IR GENERIC HISTORICAL  11/18/2015   IR ANGIOGRAM SELECTIVE EACH ADDITIONAL VESSEL 11/18/2015 [facility]  . IR GENERIC HISTORICAL  11/18/2015   IR ANGIOGRAM PELVIS SELECTIVE OR SUPRASELECTIVE 11/18/2015 [facility]  . IR GENERIC HISTORICAL  02/23/2016   IR RADIOLOGIST EVAL & MGMT 02/23/2016 Jacqulynn Cadet, MD [facility]  . IR  GENERIC HISTORICAL  12/02/2015   IR RADIOLOGIST EVAL & MGMT 12/02/2015 Greggory Keen, MD [facility]  . IR RADIOLOGIST EVAL & MGMT  08/24/2016  . WISDOM TOOTH EXTRACTION     Social History: Social History   Socioeconomic History  . Marital status: Single    Spouse name: Not on file  . Number of children: 1  . Years of education: 2 yr coll  . Highest education level: Not on file  Occupational History  . Occupation: member Therapist, occupational    Comment: Forensic scientist  Social Needs  . Financial resource strain: Not on file  . Food insecurity:    Worry: Not on file    Inability: Not on file  . Transportation needs:    Medical: Not on file    Non-medical: Not on file  Tobacco Use  . Smoking status: Former Smoker    Packs/day: 0.50    Years: 15.00    Pack years: 7.50    Types: Cigarettes    Last attempt to quit: 04/20/2002    Years since quitting: 15.5  . Smokeless tobacco: Never Used  Substance and Sexual Activity  . Alcohol use: No  . Drug use: No  . Sexual activity: Not Currently    Birth control/protection: None  Lifestyle  . Physical activity:    Days per week: Not on file    Minutes per session: Not on file  . Stress: Not on file  Relationships  . Social  connections:    Talks on phone: Not on file    Gets together: Not on file    Attends religious service: Not on file    Active member of club or organization: Not on file    Attends meetings of clubs or organizations: Not on file    Relationship status: Not on file  Other Topics Concern  . Not on file  Social History Narrative   No regular exercise.  Lives with her mother [name] and father Elberta Lachapelle   Family History: Family History  Problem Relation Age of Onset  . Hypertension Unknown   . Diabetes Father    Allergies: No Known Allergies Medications: See med rec.  Review of Systems: No fevers, chills, night sweats, weight loss, chest pain, or shortness of breath.   Objective:     General: Well Developed, well nourished, and in no acute distress.  Neuro: Alert and oriented x3, extra-ocular muscles intact, sensation grossly intact.  HEENT: Normocephalic, atraumatic, pupils equal round reactive to light, neck supple, no masses, no lymphadenopathy, thyroid nonpalpable.  Skin: Warm and dry, no rashes. Cardiac: Regular rate and rhythm, no murmurs rubs or gallops, no lower extremity edema.  Respiratory: Clear to auscultation bilaterally. Not using accessory muscles, speaking in full sentences.  Impression and Recommendations:    Right lumbar radiculitis Partial relief with right L5-S1 interlaminar epidural. On her MRI it does look like the L5-S1 disc contacts the far lateral L5 nerve root and quite possibly the intraspinal S1 nerve roots on the right. At this point we are going to proceed again with an epidural but this time right L5-S1 transforaminal, pain does go to the ball of her foot discretely in an L5 distribution. Valium did not provide rapid enough anxiolysis so we are going to do Ativan 2 mg before the procedure. Return to see me 1 month after the injection.  I spent 25 minutes with this patient, greater than 50% was face-to-face time counseling regarding the above diagnoses ___________________________________________ Gwen Her. Dianah Field, M.D., ABFM., CAQSM. Primary Care and [place] Instructor of [place] of [facility] of Medicine

## 2017-10-27 NOTE — Assessment & Plan Note (Signed)
Partial relief with right L5-S1 interlaminar epidural. On her MRI it does look like the L5-S1 disc contacts the far lateral L5 nerve root and quite possibly the intraspinal S1 nerve roots on the right. At this point we are going to proceed again with an epidural but this time right L5-S1 transforaminal, pain does go to the ball of her foot discretely in an L5 distribution. Valium did not provide rapid enough anxiolysis so we are going to do Ativan 2 mg before the procedure. Return to see me 1 month after the injection.

## 2017-10-28 ENCOUNTER — Other Ambulatory Visit: Payer: Self-pay | Admitting: Family Medicine

## 2017-11-20 ENCOUNTER — Ambulatory Visit
Admission: RE | Admit: 2017-11-20 | Discharge: 2017-11-20 | Disposition: A | Discharge status: Home / Self Care | Payer: BLUE CROSS/BLUE SHIELD | Source: Ambulatory Visit | Attending: Sports Medicine | Admitting: Sports Medicine

## 2017-11-20 DIAGNOSIS — M5126 Other intervertebral disc displacement, lumbar region: Secondary | ICD-10-CM | POA: Diagnosis not present

## 2017-11-20 MED ORDER — METHYLPREDNISOLONE ACETATE 40 MG/ML INJ SUSP (RADIOLOG
120.0000 mg | Freq: Once | INTRAMUSCULAR | Status: AC
  Administered 2017-11-20: 120 mg via EPIDURAL

## 2017-11-20 MED ORDER — IOPAMIDOL (ISOVUE-M 200) INJECTION 41%
1.0000 mL | Freq: Once | INTRAMUSCULAR | Status: AC
  Administered 2017-11-20: 1 mL via EPIDURAL

## 2017-11-20 NOTE — Discharge Instructions (Signed)

## 2017-11-23 ENCOUNTER — Ambulatory Visit (INDEPENDENT_AMBULATORY_CARE_PROVIDER_SITE_OTHER): Payer: BLUE CROSS/BLUE SHIELD | Admitting: Sports Medicine

## 2017-11-23 ENCOUNTER — Encounter: Payer: Self-pay | Admitting: Sports Medicine

## 2017-11-23 DIAGNOSIS — M5416 Radiculopathy, lumbar region: Secondary | ICD-10-CM | POA: Diagnosis not present

## 2017-11-23 MED ORDER — HYDROCODONE-ACETAMINOPHEN 10-325 MG PO TABS: 1.0000 | ORAL_TABLET | Freq: Three times a day (TID) | ORAL | 0 refills | Status: DC | PRN

## 2017-11-23 MED ORDER — GABAPENTIN 600 MG PO TABS: ORAL_TABLET | ORAL | 3 refills | Status: DC

## 2017-11-23 NOTE — Assessment & Plan Note (Signed)
Partial pain relief with right L5-S1 interlaminar epidural. On MRI it did look like the L5-S1 disc contacts the far lateral L5 nerve root and possibly the intraspinal S1 nerve root on the right. This time we did an epidural but right L5-S1 transforaminal. She had some more exquisite pain this time with a headache, and is still hurting but it has only been 3.5 days since her injection. Adding a few days of hydrocodone. If she does not have any improvement in her pain over the next couple of days I will get another MRI to ensure no CSF leak. Increasing gabapentin to the full dose.

## 2017-11-23 NOTE — Progress Notes (Signed)
Subjective:    CC: Pain after epidural  HPI: This is a pleasant 47 year old female, she had a transforaminal epidural on Monday, this is after partial response from an interlaminar epidural prior to that.  Unfortunately she has having a severe worsening of pain with a mild headache.  She is ambulatory, nothing overtly radicular, just some discomfort and wondering if this is normal.  Her bowel or bladder dysfunction, saddle numbness, no constitutional symptoms.  Pain is around the mid back, just distal to the inferior pole of the scapula.  I reviewed the past medical history, family history, social history, surgical history, and allergies today and no changes were needed.  Please see the problem list section below in epic for further details.  Past Medical History: Past Medical History:  Diagnosis Date  . Anemia   . Back pain   . Complication of anesthesia    patient has not received anesthesia before but states father and brother have a hard time waking up from anesthesia  . Family history of adverse reaction to anesthesia    mother "has a hard time waking up and moody"   . Fibroid   . Headache   . Morbid obesity ([place])   . Pneumonia    Past Surgical History: Past Surgical History:  Procedure Laterality Date  . CESAREAN SECTION  2000  . DILATION AND CURETTAGE OF UTERUS N/A 09/01/2015   Procedure: DILATATION AND CURETTAGE;  Surgeon: Emily Filbert, MD;  Location: [facility];  Service: Gynecology;  Laterality: N/A;  . IR GENERIC HISTORICAL  11/18/2015   IR US GUIDE VASC ACCESS RIGHT 11/18/2015 [facility]  . IR GENERIC HISTORICAL  11/18/2015   IR ANGIOGRAM SELECTIVE EACH ADDITIONAL VESSEL 11/18/2015 [facility]  . IR GENERIC HISTORICAL  11/18/2015   IR EMBO TUMOR ORGAN ISCHEMIA INFARCT INC GUIDE ROADMAPPING 11/18/2015 [facility]  . IR GENERIC HISTORICAL  11/18/2015   IR ANGIOGRAM PELVIS SELECTIVE OR SUPRASELECTIVE 11/18/2015 [facility]  . IR GENERIC HISTORICAL  11/18/2015   IR ANGIOGRAM  SELECTIVE EACH ADDITIONAL VESSEL 11/18/2015 [facility]  . IR GENERIC HISTORICAL  11/18/2015   IR ANGIOGRAM PELVIS SELECTIVE OR SUPRASELECTIVE 11/18/2015 [facility]  . IR GENERIC HISTORICAL  02/23/2016   IR RADIOLOGIST EVAL & MGMT 02/23/2016 Jacqulynn Cadet, MD [facility]  . IR GENERIC HISTORICAL  12/02/2015   IR RADIOLOGIST EVAL & MGMT 12/02/2015 Greggory Keen, MD [facility]  . IR RADIOLOGIST EVAL & MGMT  08/24/2016  . WISDOM TOOTH EXTRACTION     Social History: Social History   Socioeconomic History  . Marital status: Single    Spouse name: Not on file  . Number of children: 1  . Years of education: 2 yr coll  . Highest education level: Not on file  Occupational History  . Occupation: member Therapist, occupational    Comment: Forensic scientist  Social Needs  . Financial resource strain: Not on file  . Food insecurity:    Worry: Not on file    Inability: Not on file  . Transportation needs:    Medical: Not on file    Non-medical: Not on file  Tobacco Use  . Smoking status: Former Smoker    Packs/day: 0.50    Years: 15.00    Pack years: 7.50    Types: Cigarettes    Last attempt to quit: 04/20/2002    Years since quitting: 15.6  . Smokeless tobacco: Never Used  Substance and Sexual Activity  . Alcohol use: No  .  Drug use: No  . Sexual activity: Not Currently    Birth control/protection: None  Lifestyle  . Physical activity:    Days per week: Not on file    Minutes per session: Not on file  . Stress: Not on file  Relationships  . Social connections:    Talks on phone: Not on file    Gets together: Not on file    Attends religious service: Not on file    Active member of club or organization: Not on file    Attends meetings of clubs or organizations: Not on file    Relationship status: Not on file  Other Topics Concern  . Not on file  Social History Narrative   No regular exercise.  Lives with her mother [name] and father Bari Leib    Family History: Family History  Problem Relation Age of Onset  . Hypertension Unknown   . Diabetes Father    Allergies: No Known Allergies Medications: See med rec.  Review of Systems: No fevers, chills, night sweats, weight loss, chest pain, or shortness of breath.   Objective:    General: Well Developed, well nourished, and in no acute distress.  Neuro: Alert and oriented x3, extra-ocular muscles intact, sensation grossly intact.  HEENT: Normocephalic, atraumatic, pupils equal round reactive to light, neck supple, no masses, no lymphadenopathy, thyroid nonpalpable.  Skin: Warm and dry, no rashes. Cardiac: Regular rate and rhythm, no murmurs rubs or gallops, no lower extremity edema.  Respiratory: Clear to auscultation bilaterally. Not using accessory muscles, speaking in full sentences. Back Exam:  Inspection: Unremarkable  Motion: Flexion 45 deg, Extension 45 deg, Side Bending to 45 deg bilaterally,  Rotation to 45 deg bilaterally  SLR laying: Negative  XSLR laying: Negative  Palpable tenderness: None. FABER: negative. Sensory change: Gross sensation intact to all lumbar and sacral dermatomes.  Reflexes: 2+ at both patellar tendons, 2+ at achilles tendons, Babinski's downgoing.  Strength at foot  Plantar-flexion: 5/5 Dorsi-flexion: 5/5 Eversion: 5/5 Inversion: 5/5  Leg strength  Quad: 5/5 Hamstring: 5/5 Hip flexor: 5/5 Hip abductors: 5/5  Gait unremarkable.  Impression and Recommendations:    Right lumbar radiculitis Partial pain relief with right L5-S1 interlaminar epidural. On MRI it did look like the L5-S1 disc contacts the far lateral L5 nerve root and possibly the intraspinal S1 nerve root on the right. This time we did an epidural but right L5-S1 transforaminal. She had some more exquisite pain this time with a headache, and is still hurting but it has only been 3.5 days since her injection. Adding a few days of hydrocodone. If she does not have any  improvement in her pain over the next couple of days I will get another MRI to ensure no CSF leak. Increasing gabapentin to the full dose. ___________________________________________ Gwen Her. Dianah Field, M.D., ABFM., CAQSM. Primary Care and [place] Instructor of [place] of [facility] of Medicine

## 2017-12-01 ENCOUNTER — Encounter: Payer: Self-pay | Admitting: Sports Medicine

## 2017-12-01 DIAGNOSIS — M5030 Other cervical disc degeneration, unspecified cervical region: Secondary | ICD-10-CM

## 2017-12-02 DIAGNOSIS — M797 Fibromyalgia: Secondary | ICD-10-CM | POA: Insufficient documentation

## 2017-12-02 NOTE — Assessment & Plan Note (Signed)
Periscapular discomfort for months, failed conservative tx and physician directed PT for >6 weeks.   Xray, MRI, return for cervical interventional planning.

## 2017-12-06 ENCOUNTER — Ambulatory Visit (INDEPENDENT_AMBULATORY_CARE_PROVIDER_SITE_OTHER): Payer: BLUE CROSS/BLUE SHIELD

## 2017-12-06 DIAGNOSIS — M545 Low back pain: Secondary | ICD-10-CM

## 2017-12-06 DIAGNOSIS — M5489 Other dorsalgia: Secondary | ICD-10-CM

## 2017-12-06 DIAGNOSIS — M5030 Other cervical disc degeneration, unspecified cervical region: Secondary | ICD-10-CM

## 2017-12-06 DIAGNOSIS — M542 Cervicalgia: Secondary | ICD-10-CM | POA: Diagnosis not present

## 2017-12-20 ENCOUNTER — Ambulatory Visit: Payer: BLUE CROSS/BLUE SHIELD | Admitting: Sports Medicine

## 2017-12-20 ENCOUNTER — Encounter: Payer: Self-pay | Admitting: Sports Medicine

## 2017-12-20 DIAGNOSIS — M5030 Other cervical disc degeneration, unspecified cervical region: Secondary | ICD-10-CM | POA: Diagnosis not present

## 2017-12-20 DIAGNOSIS — M5416 Radiculopathy, lumbar region: Secondary | ICD-10-CM | POA: Diagnosis not present

## 2017-12-20 MED ORDER — LORAZEPAM 2 MG PO TABS: ORAL_TABLET | ORAL | 0 refills | Status: DC

## 2017-12-20 NOTE — Assessment & Plan Note (Signed)
Distant periscapular discomfort that failed conservative measures. We are going to proceed with MRI for interventional planning, she does have bilateral periscapular symptoms. Maximum doses of gabapentin have only provided minimal relief.

## 2017-12-20 NOTE — Progress Notes (Signed)
Subjective:    CC: Neck pain  HPI: This is a pleasant 47 year old female, we have been treating her for lumbar radiculitis, she has had some new pain in her neck with radiation to the left and right periscapular regions without overt radicular symptoms to the hands or fingertips.  Moderate, persistent.  No progressive weakness, no trauma, no constitutional symptoms.  She has done greater than 6 weeks of physician directed conservative measures, steroids, NSAIDs, we bumped up her gabapentin to the max dose and she has noted minimal improvement.  I reviewed the past medical history, family history, social history, surgical history, and allergies today and no changes were needed.  Please see the problem list section below in epic for further details.  Past Medical History: Past Medical History:  Diagnosis Date  . Anemia   . Back pain   . Complication of anesthesia    patient has not received anesthesia before but states father and brother have a hard time waking up from anesthesia  . Family history of adverse reaction to anesthesia    mother "has a hard time waking up and moody"   . Fibroid   . Headache   . Morbid obesity ([place])   . Pneumonia    Past Surgical History: Past Surgical History:  Procedure Laterality Date  . CESAREAN SECTION  2000  . DILATION AND CURETTAGE OF UTERUS N/A 09/01/2015   Procedure: DILATATION AND CURETTAGE;  Surgeon: Elizabeth Filbert, MD;  Location: [facility];  Service: Gynecology;  Laterality: N/A;  . IR GENERIC HISTORICAL  11/18/2015   IR US GUIDE VASC ACCESS RIGHT 11/18/2015 [facility]  . IR GENERIC HISTORICAL  11/18/2015   IR ANGIOGRAM SELECTIVE EACH ADDITIONAL VESSEL 11/18/2015 [facility]  . IR GENERIC HISTORICAL  11/18/2015   IR EMBO TUMOR ORGAN ISCHEMIA INFARCT INC GUIDE ROADMAPPING 11/18/2015 [facility]  . IR GENERIC HISTORICAL  11/18/2015   IR ANGIOGRAM PELVIS SELECTIVE OR SUPRASELECTIVE 11/18/2015 [facility]  . IR GENERIC HISTORICAL  11/18/2015   IR  ANGIOGRAM SELECTIVE EACH ADDITIONAL VESSEL 11/18/2015 [facility]  . IR GENERIC HISTORICAL  11/18/2015   IR ANGIOGRAM PELVIS SELECTIVE OR SUPRASELECTIVE 11/18/2015 [facility]  . IR GENERIC HISTORICAL  02/23/2016   IR RADIOLOGIST EVAL & MGMT 02/23/2016 Elizabeth Cadet, MD [facility]  . IR GENERIC HISTORICAL  12/02/2015   IR RADIOLOGIST EVAL & MGMT 12/02/2015 Elizabeth Keen, MD [facility]  . IR RADIOLOGIST EVAL & MGMT  08/24/2016  . WISDOM TOOTH EXTRACTION     Social History: Social History   Socioeconomic History  . Marital status: Single    Spouse name: Not on file  . Number of children: 1  . Years of education: 2 yr coll  . Highest education level: Not on file  Occupational History  . Occupation: member Therapist, occupational    Comment: Forensic scientist  Social Needs  . Financial resource strain: Not on file  . Food insecurity:    Worry: Not on file    Inability: Not on file  . Transportation needs:    Medical: Not on file    Non-medical: Not on file  Tobacco Use  . Smoking status: Former Smoker    Packs/day: 0.50    Years: 15.00    Pack years: 7.50    Types: Cigarettes    Last attempt to quit: 04/20/2002    Years since quitting: 15.6  . Smokeless tobacco: Never Used  Substance and Sexual Activity  . Alcohol use: No  .  Drug use: No  . Sexual activity: Not Currently    Birth control/protection: None  Lifestyle  . Physical activity:    Days per week: Not on file    Minutes per session: Not on file  . Stress: Not on file  Relationships  . Social connections:    Talks on phone: Not on file    Gets together: Not on file    Attends religious service: Not on file    Active member of club or organization: Not on file    Attends meetings of clubs or organizations: Not on file    Relationship status: Not on file  Other Topics Concern  . Not on file  Social History Narrative   No regular exercise.  Lives with her mother Elizabeth Copeland and father Elizabeth Copeland   Family History: Family History  Problem Relation Age of Onset  . Hypertension Unknown   . Diabetes Father    Allergies: No Known Allergies Medications: See med rec.  Review of Systems: No fevers, chills, night sweats, weight loss, chest pain, or shortness of breath.   Objective:    General: Well Developed, well nourished, and in no acute distress.  Neuro: Alert and oriented x3, extra-ocular muscles intact, sensation grossly intact.  HEENT: Normocephalic, atraumatic, pupils equal round reactive to light, neck supple, no masses, no lymphadenopathy, thyroid nonpalpable.  Skin: Warm and dry, no rashes. Cardiac: Regular rate and rhythm, no murmurs rubs or gallops, no lower extremity edema.  Respiratory: Clear to auscultation bilaterally. Not using accessory muscles, speaking in full sentences. Neck: Negative spurling's Full neck range of motion Grip strength and sensation normal in bilateral hands Strength good C4 to T1 distribution No sensory change to C4 to T1 Reflexes normal Tenderness in the left and right periscapular muscles, trapezius, rhomboids.  Cervical spine x-rays reviewed and show straightening of the normal cervical lordosis consistent with paraspinal spasm.  Impression and Recommendations:    Disc disease, degenerative, cervical Distant periscapular discomfort that failed conservative measures. We are going to proceed with MRI for interventional planning, she does have bilateral periscapular symptoms. Maximum doses of gabapentin have only provided minimal relief. ___________________________________________ Gwen Her. Dianah Field, M.D., ABFM., CAQSM. Primary Care and [place] Instructor of [place] of [facility] of Medicine

## 2017-12-25 ENCOUNTER — Ambulatory Visit (INDEPENDENT_AMBULATORY_CARE_PROVIDER_SITE_OTHER): Payer: BLUE CROSS/BLUE SHIELD

## 2017-12-25 DIAGNOSIS — M25511 Pain in right shoulder: Secondary | ICD-10-CM | POA: Diagnosis not present

## 2017-12-25 DIAGNOSIS — M25512 Pain in left shoulder: Secondary | ICD-10-CM | POA: Diagnosis not present

## 2017-12-25 DIAGNOSIS — M542 Cervicalgia: Secondary | ICD-10-CM | POA: Diagnosis not present

## 2017-12-25 DIAGNOSIS — M5030 Other cervical disc degeneration, unspecified cervical region: Secondary | ICD-10-CM

## 2017-12-25 DIAGNOSIS — M438X2 Other specified deforming dorsopathies, cervical region: Secondary | ICD-10-CM | POA: Diagnosis not present

## 2018-01-03 ENCOUNTER — Encounter: Payer: Self-pay | Admitting: Sports Medicine

## 2018-01-03 ENCOUNTER — Ambulatory Visit: Payer: BLUE CROSS/BLUE SHIELD | Admitting: Sports Medicine

## 2018-01-03 DIAGNOSIS — M542 Cervicalgia: Secondary | ICD-10-CM

## 2018-01-03 DIAGNOSIS — M5416 Radiculopathy, lumbar region: Secondary | ICD-10-CM

## 2018-01-03 MED ORDER — DULOXETINE HCL 30 MG PO CPEP: 30.0000 mg | ORAL_CAPSULE | Freq: Every day | ORAL | 3 refills | Status: DC

## 2018-01-03 MED ORDER — TRAMADOL HCL 50 MG PO TABS: 50.0000 mg | ORAL_TABLET | Freq: Three times a day (TID) | ORAL | 0 refills | Status: DC | PRN

## 2018-01-03 NOTE — Assessment & Plan Note (Addendum)
Did have partial pain relief with an initial right L5-S1 interlaminar epidural. On MRI it does look like the L5-S1 disc contacts the right L5 nerve root. This can potentially cause neural irritation even if there is no overt neural compression. Continue gabapentin. Adding Cymbalta for SNRI neuropathic effect. She is going to do some chiropractic manipulation as well. Adding tramadol for breakthrough pain.

## 2018-01-03 NOTE — Progress Notes (Signed)
Subjective:    CC: MRI results  HPI: This is a pleasant 47 year old female, she returns for follow-up of her MRI, she was having neck and periscapular type pain, MRI ultimately was negative for disc protrusions and facet arthritis, only straightening of the normal lordosis.  This is improving on its own with conservative measures.  She also has lumbar radiculitis, right L5, she did well with a right L5-S1 interlaminar epidural.  She is taking gabapentin at a high dose, and is agreeable to try additional neuropathic agents as well as chiropractic manipulation.  I reviewed the past medical history, family history, social history, surgical history, and allergies today and no changes were needed.  Please see the problem list section below in epic for further details.  Past Medical History: Past Medical History:  Diagnosis Date  . Anemia   . Back pain   . Complication of anesthesia    patient has not received anesthesia before but states father and brother have a hard time waking up from anesthesia  . Family history of adverse reaction to anesthesia    mother "has a hard time waking up and moody"   . Fibroid   . Headache   . Morbid obesity ([place])   . Pneumonia    Past Surgical History: Past Surgical History:  Procedure Laterality Date  . CESAREAN SECTION  2000  . DILATION AND CURETTAGE OF UTERUS N/A 09/01/2015   Procedure: DILATATION AND CURETTAGE;  Surgeon: Emily Filbert, MD;  Location: [facility];  Service: Gynecology;  Laterality: N/A;  . IR GENERIC HISTORICAL  11/18/2015   IR US GUIDE VASC ACCESS RIGHT 11/18/2015 [facility]  . IR GENERIC HISTORICAL  11/18/2015   IR ANGIOGRAM SELECTIVE EACH ADDITIONAL VESSEL 11/18/2015 [facility]  . IR GENERIC HISTORICAL  11/18/2015   IR EMBO TUMOR ORGAN ISCHEMIA INFARCT INC GUIDE ROADMAPPING 11/18/2015 [facility]  . IR GENERIC HISTORICAL  11/18/2015   IR ANGIOGRAM PELVIS SELECTIVE OR SUPRASELECTIVE 11/18/2015 [facility]  . IR GENERIC HISTORICAL   11/18/2015   IR ANGIOGRAM SELECTIVE EACH ADDITIONAL VESSEL 11/18/2015 [facility]  . IR GENERIC HISTORICAL  11/18/2015   IR ANGIOGRAM PELVIS SELECTIVE OR SUPRASELECTIVE 11/18/2015 [facility]  . IR GENERIC HISTORICAL  02/23/2016   IR RADIOLOGIST EVAL & MGMT 02/23/2016 Jacqulynn Cadet, MD [facility]  . IR GENERIC HISTORICAL  12/02/2015   IR RADIOLOGIST EVAL & MGMT 12/02/2015 Greggory Keen, MD [facility]  . IR RADIOLOGIST EVAL & MGMT  08/24/2016  . WISDOM TOOTH EXTRACTION     Social History: Social History   Socioeconomic History  . Marital status: Single    Spouse name: Not on file  . Number of children: 1  . Years of education: 2 yr coll  . Highest education level: Not on file  Occupational History  . Occupation: member Therapist, occupational    Comment: Forensic scientist  Social Needs  . Financial resource strain: Not on file  . Food insecurity:    Worry: Not on file    Inability: Not on file  . Transportation needs:    Medical: Not on file    Non-medical: Not on file  Tobacco Use  . Smoking status: Former Smoker    Packs/day: 0.50    Years: 15.00    Pack years: 7.50    Types: Cigarettes    Last attempt to quit: 04/20/2002    Years since quitting: 15.7  . Smokeless tobacco: Never Used  Substance and Sexual Activity  .  Alcohol use: No  . Drug use: No  . Sexual activity: Not Currently    Birth control/protection: None  Lifestyle  . Physical activity:    Days per week: Not on file    Minutes per session: Not on file  . Stress: Not on file  Relationships  . Social connections:    Talks on phone: Not on file    Gets together: Not on file    Attends religious service: Not on file    Active member of club or organization: Not on file    Attends meetings of clubs or organizations: Not on file    Relationship status: Not on file  Other Topics Concern  . Not on file  Social History Narrative   No regular exercise.  Lives with her mother [name] and  father Safiyah Cisney   Family History: Family History  Problem Relation Age of Onset  . Hypertension Unknown   . Diabetes Father    Allergies: No Known Allergies Medications: See med rec.  Review of Systems: No fevers, chills, night sweats, weight loss, chest pain, or shortness of breath.   Objective:    General: Well Developed, well nourished, and in no acute distress.  Neuro: Alert and oriented x3, extra-ocular muscles intact, sensation grossly intact.  HEENT: Normocephalic, atraumatic, pupils equal round reactive to light, neck supple, no masses, no lymphadenopathy, thyroid nonpalpable.  Skin: Warm and dry, no rashes. Cardiac: Regular rate and rhythm, no murmurs rubs or gallops, no lower extremity edema.  Respiratory: Clear to auscultation bilaterally. Not using accessory muscles, speaking in full sentences.  Impression and Recommendations:    Neck pain This is predominantly myofascial, cervical spine MRI showed some straightening of the normal lordosis without generative disc disease or facet arthritis. There is also some periscapular symptoms.   Continue gabapentin, adding Cymbalta 30 mg. I would like her to touch base with Dr. Earl Lagos with chiropractic medicine.  Right lumbar radiculitis Did have partial pain relief with an initial right L5-S1 interlaminar epidural. On MRI it does look like the L5-S1 disc contacts the right L5 nerve root. This can potentially cause neural irritation even if there is no overt neural compression. Continue gabapentin. Adding Cymbalta for SNRI neuropathic effect. She is going to do some chiropractic manipulation as well. Adding tramadol for breakthrough pain.  I spent 25 minutes with this patient, greater than 50% was face-to-face time counseling regarding the above diagnoses, specifically discussing the anatomy and general management of radiculopathy and myofascial pain syndrome. ___________________________________________ Gwen Her.  Dianah Field, M.D., ABFM., CAQSM. Primary Care and [place] Instructor of [place] of [facility] of Medicine

## 2018-01-03 NOTE — Assessment & Plan Note (Signed)
This is predominantly myofascial, cervical spine MRI showed some straightening of the normal lordosis without generative disc disease or facet arthritis. There is also some periscapular symptoms.   Continue gabapentin, adding Cymbalta 30 mg. I would like her to touch base with Dr. Earl Lagos with chiropractic medicine.

## 2018-01-08 ENCOUNTER — Encounter: Payer: Self-pay | Admitting: Sports Medicine

## 2018-01-08 MED ORDER — DULOXETINE HCL 20 MG PO CPEP: 20.0000 mg | ORAL_CAPSULE | Freq: Every day | ORAL | 3 refills | Status: DC

## 2018-01-10 ENCOUNTER — Encounter: Payer: Self-pay | Admitting: Family Medicine

## 2018-01-13 ENCOUNTER — Other Ambulatory Visit: Payer: Self-pay | Admitting: Family Medicine

## 2018-01-13 DIAGNOSIS — I10 Essential (primary) hypertension: Secondary | ICD-10-CM

## 2018-01-24 DIAGNOSIS — M40292 Other kyphosis, cervical region: Secondary | ICD-10-CM | POA: Diagnosis not present

## 2018-01-24 DIAGNOSIS — M62830 Muscle spasm of back: Secondary | ICD-10-CM | POA: Diagnosis not present

## 2018-01-24 DIAGNOSIS — M542 Cervicalgia: Secondary | ICD-10-CM | POA: Diagnosis not present

## 2018-01-24 DIAGNOSIS — M5116 Intervertebral disc disorders with radiculopathy, lumbar region: Secondary | ICD-10-CM | POA: Diagnosis not present

## 2018-01-25 DIAGNOSIS — M62830 Muscle spasm of back: Secondary | ICD-10-CM | POA: Diagnosis not present

## 2018-01-25 DIAGNOSIS — M40292 Other kyphosis, cervical region: Secondary | ICD-10-CM | POA: Diagnosis not present

## 2018-01-25 DIAGNOSIS — M542 Cervicalgia: Secondary | ICD-10-CM | POA: Diagnosis not present

## 2018-01-25 DIAGNOSIS — M5116 Intervertebral disc disorders with radiculopathy, lumbar region: Secondary | ICD-10-CM | POA: Diagnosis not present

## 2018-01-29 DIAGNOSIS — M62830 Muscle spasm of back: Secondary | ICD-10-CM | POA: Diagnosis not present

## 2018-01-29 DIAGNOSIS — M542 Cervicalgia: Secondary | ICD-10-CM | POA: Diagnosis not present

## 2018-01-29 DIAGNOSIS — M40292 Other kyphosis, cervical region: Secondary | ICD-10-CM | POA: Diagnosis not present

## 2018-01-29 DIAGNOSIS — M5116 Intervertebral disc disorders with radiculopathy, lumbar region: Secondary | ICD-10-CM | POA: Diagnosis not present

## 2018-01-31 ENCOUNTER — Encounter: Payer: Self-pay | Admitting: Sports Medicine

## 2018-01-31 ENCOUNTER — Ambulatory Visit: Payer: BLUE CROSS/BLUE SHIELD | Admitting: Sports Medicine

## 2018-01-31 DIAGNOSIS — M542 Cervicalgia: Secondary | ICD-10-CM | POA: Diagnosis not present

## 2018-01-31 DIAGNOSIS — M797 Fibromyalgia: Secondary | ICD-10-CM

## 2018-01-31 DIAGNOSIS — M5116 Intervertebral disc disorders with radiculopathy, lumbar region: Secondary | ICD-10-CM | POA: Diagnosis not present

## 2018-01-31 DIAGNOSIS — M5416 Radiculopathy, lumbar region: Secondary | ICD-10-CM | POA: Diagnosis not present

## 2018-01-31 DIAGNOSIS — M40292 Other kyphosis, cervical region: Secondary | ICD-10-CM | POA: Diagnosis not present

## 2018-01-31 DIAGNOSIS — M62830 Muscle spasm of back: Secondary | ICD-10-CM | POA: Diagnosis not present

## 2018-01-31 MED ORDER — KETOROLAC TROMETHAMINE 30 MG/ML IJ SOLN
30.0000 mg | Freq: Once | INTRAMUSCULAR | Status: AC
  Administered 2018-01-31: 30 mg via INTRAMUSCULAR

## 2018-01-31 MED ORDER — GABAPENTIN 800 MG PO TABS: ORAL_TABLET | ORAL | 11 refills | Status: DC

## 2018-01-31 MED ORDER — MILNACIPRAN HCL 12.5 & 25 & 50 MG PO MISC: ORAL | 0 refills | Status: DC

## 2018-01-31 NOTE — Assessment & Plan Note (Addendum)
Partial pain relief with initial right L5-S1 interlaminar epidural, she does have an L5-S1 protruding disc contacting the right L5 nerve root. She did discontinue her gabapentin, restarting, she did have good relief with 800 in the morning, midday and 1600 at bedtime. She is also doing well with chiropractic manipulation. She does have a chiropractic session scheduled today but is in a good deal of pain so hitting her with some Toradol right now.

## 2018-01-31 NOTE — Progress Notes (Signed)
Subjective:    CC: Neck and back pain  HPI: Neck pain: Mostly fibromyalgia, has a normal cervical spine MRI.  Did not tolerate Cymbalta but inadvertently discontinued her gabapentin at the same time.  Back pain: With L5-S1 degenerative disc disease contacting the right L5 nerve root.  Did well with a right L5-S1 interlaminar epidural, gabapentin, and has now restarted chiropractic manipulation with Dr. Belenda Cruise with good effect.  She did some yard work and has a good amount of pain today and is somewhat worried about being able to tolerate her chiropractic session today.  I reviewed the past medical history, family history, social history, surgical history, and allergies today and no changes were needed.  Please see the problem list section below in epic for further details.  Past Medical History: Past Medical History:  Diagnosis Date  . Anemia   . Back pain   . Complication of anesthesia    patient has not received anesthesia before but states father and brother have a hard time waking up from anesthesia  . Family history of adverse reaction to anesthesia    mother "has a hard time waking up and moody"   . Fibroid   . Headache   . Morbid obesity ([place])   . Pneumonia    Past Surgical History: Past Surgical History:  Procedure Laterality Date  . CESAREAN SECTION  2000  . DILATION AND CURETTAGE OF UTERUS N/A 09/01/2015   Procedure: DILATATION AND CURETTAGE;  Surgeon: Emily Filbert, MD;  Location: [facility];  Service: Gynecology;  Laterality: N/A;  . IR GENERIC HISTORICAL  11/18/2015   IR US GUIDE VASC ACCESS RIGHT 11/18/2015 [facility]  . IR GENERIC HISTORICAL  11/18/2015   IR ANGIOGRAM SELECTIVE EACH ADDITIONAL VESSEL 11/18/2015 [facility]  . IR GENERIC HISTORICAL  11/18/2015   IR EMBO TUMOR ORGAN ISCHEMIA INFARCT INC GUIDE ROADMAPPING 11/18/2015 [facility]  . IR GENERIC HISTORICAL  11/18/2015   IR ANGIOGRAM PELVIS SELECTIVE OR SUPRASELECTIVE 11/18/2015 [facility]  . IR GENERIC  HISTORICAL  11/18/2015   IR ANGIOGRAM SELECTIVE EACH ADDITIONAL VESSEL 11/18/2015 [facility]  . IR GENERIC HISTORICAL  11/18/2015   IR ANGIOGRAM PELVIS SELECTIVE OR SUPRASELECTIVE 11/18/2015 [facility]  . IR GENERIC HISTORICAL  02/23/2016   IR RADIOLOGIST EVAL & MGMT 02/23/2016 Jacqulynn Cadet, MD [facility]  . IR GENERIC HISTORICAL  12/02/2015   IR RADIOLOGIST EVAL & MGMT 12/02/2015 Greggory Keen, MD [facility]  . IR RADIOLOGIST EVAL & MGMT  08/24/2016  . WISDOM TOOTH EXTRACTION     Social History: Social History   Socioeconomic History  . Marital status: Single    Spouse name: Not on file  . Number of children: 1  . Years of education: 2 yr coll  . Highest education level: Not on file  Occupational History  . Occupation: member Therapist, occupational    Comment: Forensic scientist  Social Needs  . Financial resource strain: Not on file  . Food insecurity:    Worry: Not on file    Inability: Not on file  . Transportation needs:    Medical: Not on file    Non-medical: Not on file  Tobacco Use  . Smoking status: Former Smoker    Packs/day: 0.50    Years: 15.00    Pack years: 7.50    Types: Cigarettes    Last attempt to quit: 04/20/2002    Years since quitting: 15.7  . Smokeless tobacco: Never Used  Substance and Sexual  Activity  . Alcohol use: No  . Drug use: No  . Sexual activity: Not Currently    Birth control/protection: None  Lifestyle  . Physical activity:    Days per week: Not on file    Minutes per session: Not on file  . Stress: Not on file  Relationships  . Social connections:    Talks on phone: Not on file    Gets together: Not on file    Attends religious service: Not on file    Active member of club or organization: Not on file    Attends meetings of clubs or organizations: Not on file    Relationship status: Not on file  Other Topics Concern  . Not on file  Social History Narrative   No regular exercise.  Lives with her mother  [name] and father Delsa Walder   Family History: Family History  Problem Relation Age of Onset  . Hypertension Unknown   . Diabetes Father    Allergies: No Known Allergies Medications: See med rec.  Review of Systems: No fevers, chills, night sweats, weight loss, chest pain, or shortness of breath.   Objective:    General: Well Developed, well nourished, and in no acute distress.  Neuro: Alert and oriented x3, extra-ocular muscles intact, sensation grossly intact.  HEENT: Normocephalic, atraumatic, pupils equal round reactive to light, neck supple, no masses, no lymphadenopathy, thyroid nonpalpable.  Skin: Warm and dry, no rashes. Cardiac: Regular rate and rhythm, no murmurs rubs or gallops, no lower extremity edema.  Respiratory: Clear to auscultation bilaterally. Not using accessory muscles, speaking in full sentences.  Toradol 30 mg intramuscular.  Impression and Recommendations:    Right lumbar radiculitis Partial pain relief with initial right L5-S1 interlaminar epidural, she does have an L5-S1 protruding disc contacting the right L5 nerve root. She did discontinue her gabapentin, restarting, she did have good relief with 800 in the morning, midday and 1600 at bedtime. She is also doing well with chiropractic manipulation. She does have a chiropractic session scheduled today but is in a good deal of pain so hitting her with some Toradol right now.  Fibromyalgia Neck and shoulder girdle symptoms are going to predominantly be myofascial, she had some straightening of the cervical lordosis but otherwise a completely normal cervical spine MRI without DDD or facet arthritis. Did not tolerate Cymbalta but inadvertently discontinued her gabapentin at the same time as starting Cymbalta. She will restart gabapentin and I am going to switch to [facility]. ___________________________________________ Gwen Her. Dianah Field, M.D., ABFM., CAQSM. Primary Care and Sports Medicine [facility]  Adjunct Professor of [place] of [facility] of Medicine

## 2018-01-31 NOTE — Assessment & Plan Note (Signed)
Neck and shoulder girdle symptoms are going to predominantly be myofascial, she had some straightening of the cervical lordosis but otherwise a completely normal cervical spine MRI without DDD or facet arthritis. Did not tolerate Cymbalta but inadvertently discontinued her gabapentin at the same time as starting Cymbalta. She will restart gabapentin and I am going to switch to [facility].

## 2018-02-02 DIAGNOSIS — M5116 Intervertebral disc disorders with radiculopathy, lumbar region: Secondary | ICD-10-CM | POA: Diagnosis not present

## 2018-02-02 DIAGNOSIS — M40292 Other kyphosis, cervical region: Secondary | ICD-10-CM | POA: Diagnosis not present

## 2018-02-02 DIAGNOSIS — M542 Cervicalgia: Secondary | ICD-10-CM | POA: Diagnosis not present

## 2018-02-02 DIAGNOSIS — M62830 Muscle spasm of back: Secondary | ICD-10-CM | POA: Diagnosis not present

## 2018-02-05 DIAGNOSIS — M40292 Other kyphosis, cervical region: Secondary | ICD-10-CM | POA: Diagnosis not present

## 2018-02-05 DIAGNOSIS — M542 Cervicalgia: Secondary | ICD-10-CM | POA: Diagnosis not present

## 2018-02-05 DIAGNOSIS — M62830 Muscle spasm of back: Secondary | ICD-10-CM | POA: Diagnosis not present

## 2018-02-05 DIAGNOSIS — M5116 Intervertebral disc disorders with radiculopathy, lumbar region: Secondary | ICD-10-CM | POA: Diagnosis not present

## 2018-02-07 DIAGNOSIS — M5116 Intervertebral disc disorders with radiculopathy, lumbar region: Secondary | ICD-10-CM | POA: Diagnosis not present

## 2018-02-07 DIAGNOSIS — M542 Cervicalgia: Secondary | ICD-10-CM | POA: Diagnosis not present

## 2018-02-07 DIAGNOSIS — M40292 Other kyphosis, cervical region: Secondary | ICD-10-CM | POA: Diagnosis not present

## 2018-02-07 DIAGNOSIS — M62830 Muscle spasm of back: Secondary | ICD-10-CM | POA: Diagnosis not present

## 2018-02-09 ENCOUNTER — Other Ambulatory Visit: Payer: Self-pay | Admitting: Obstetrics & Gynecology

## 2018-02-09 ENCOUNTER — Other Ambulatory Visit (HOSPITAL_COMMUNITY): Payer: Self-pay | Admitting: Obstetrics & Gynecology

## 2018-02-09 DIAGNOSIS — Z1231 Encounter for screening mammogram for malignant neoplasm of breast: Secondary | ICD-10-CM

## 2018-02-12 DIAGNOSIS — M40292 Other kyphosis, cervical region: Secondary | ICD-10-CM | POA: Diagnosis not present

## 2018-02-12 DIAGNOSIS — M5116 Intervertebral disc disorders with radiculopathy, lumbar region: Secondary | ICD-10-CM | POA: Diagnosis not present

## 2018-02-12 DIAGNOSIS — M542 Cervicalgia: Secondary | ICD-10-CM | POA: Diagnosis not present

## 2018-02-12 DIAGNOSIS — M62830 Muscle spasm of back: Secondary | ICD-10-CM | POA: Diagnosis not present

## 2018-02-14 DIAGNOSIS — M62830 Muscle spasm of back: Secondary | ICD-10-CM | POA: Diagnosis not present

## 2018-02-14 DIAGNOSIS — M40292 Other kyphosis, cervical region: Secondary | ICD-10-CM | POA: Diagnosis not present

## 2018-02-14 DIAGNOSIS — M542 Cervicalgia: Secondary | ICD-10-CM | POA: Diagnosis not present

## 2018-02-14 DIAGNOSIS — M5116 Intervertebral disc disorders with radiculopathy, lumbar region: Secondary | ICD-10-CM | POA: Diagnosis not present

## 2018-02-19 DIAGNOSIS — M62830 Muscle spasm of back: Secondary | ICD-10-CM | POA: Diagnosis not present

## 2018-02-19 DIAGNOSIS — M542 Cervicalgia: Secondary | ICD-10-CM | POA: Diagnosis not present

## 2018-02-19 DIAGNOSIS — M5116 Intervertebral disc disorders with radiculopathy, lumbar region: Secondary | ICD-10-CM | POA: Diagnosis not present

## 2018-02-19 DIAGNOSIS — M40292 Other kyphosis, cervical region: Secondary | ICD-10-CM | POA: Diagnosis not present

## 2018-02-21 ENCOUNTER — Encounter: Payer: Self-pay | Admitting: Sports Medicine

## 2018-02-21 DIAGNOSIS — M5116 Intervertebral disc disorders with radiculopathy, lumbar region: Secondary | ICD-10-CM | POA: Diagnosis not present

## 2018-02-21 DIAGNOSIS — M40292 Other kyphosis, cervical region: Secondary | ICD-10-CM | POA: Diagnosis not present

## 2018-02-21 DIAGNOSIS — M62830 Muscle spasm of back: Secondary | ICD-10-CM | POA: Diagnosis not present

## 2018-02-21 DIAGNOSIS — M542 Cervicalgia: Secondary | ICD-10-CM | POA: Diagnosis not present

## 2018-02-26 DIAGNOSIS — M5116 Intervertebral disc disorders with radiculopathy, lumbar region: Secondary | ICD-10-CM | POA: Diagnosis not present

## 2018-02-26 DIAGNOSIS — M62830 Muscle spasm of back: Secondary | ICD-10-CM | POA: Diagnosis not present

## 2018-02-26 DIAGNOSIS — M40292 Other kyphosis, cervical region: Secondary | ICD-10-CM | POA: Diagnosis not present

## 2018-02-26 DIAGNOSIS — M542 Cervicalgia: Secondary | ICD-10-CM | POA: Diagnosis not present

## 2018-02-28 ENCOUNTER — Ambulatory Visit: Payer: BLUE CROSS/BLUE SHIELD | Admitting: Sports Medicine

## 2018-02-28 ENCOUNTER — Encounter: Payer: Self-pay | Admitting: Sports Medicine

## 2018-02-28 DIAGNOSIS — M797 Fibromyalgia: Secondary | ICD-10-CM | POA: Diagnosis not present

## 2018-02-28 DIAGNOSIS — M62830 Muscle spasm of back: Secondary | ICD-10-CM | POA: Diagnosis not present

## 2018-02-28 DIAGNOSIS — M542 Cervicalgia: Secondary | ICD-10-CM | POA: Diagnosis not present

## 2018-02-28 DIAGNOSIS — M5116 Intervertebral disc disorders with radiculopathy, lumbar region: Secondary | ICD-10-CM | POA: Diagnosis not present

## 2018-02-28 DIAGNOSIS — M40292 Other kyphosis, cervical region: Secondary | ICD-10-CM | POA: Diagnosis not present

## 2018-02-28 MED ORDER — MILNACIPRAN HCL 100 MG PO TABS: 100.0000 mg | ORAL_TABLET | Freq: Two times a day (BID) | ORAL | 11 refills | Status: DC

## 2018-02-28 MED ORDER — KETOROLAC TROMETHAMINE 15.75 MG/SPRAY NA SOLN: 1.0000 | Freq: Four times a day (QID) | NASAL | 3 refills | Status: AC | PRN

## 2018-02-28 NOTE — Progress Notes (Signed)
Subjective:    CC: Follow-up  HPI: Fibromyalgia: Starting to improve with Savella.  I reviewed the past medical history, family history, social history, surgical history, and allergies today and no changes were needed.  Please see the problem list section below in epic for further details.  Past Medical History: Past Medical History:  Diagnosis Date  . Anemia   . Back pain   . Complication of anesthesia    patient has not received anesthesia before but states father and brother have a hard time waking up from anesthesia  . Family history of adverse reaction to anesthesia    mother "has a hard time waking up and moody"   . Fibroid   . Headache   . Morbid obesity ([place])   . Pneumonia    Past Surgical History: Past Surgical History:  Procedure Laterality Date  . CESAREAN SECTION  2000  . DILATION AND CURETTAGE OF UTERUS N/A 09/01/2015   Procedure: DILATATION AND CURETTAGE;  Surgeon: Emily Filbert, MD;  Location: [facility];  Service: Gynecology;  Laterality: N/A;  . IR GENERIC HISTORICAL  11/18/2015   IR US GUIDE VASC ACCESS RIGHT 11/18/2015 [facility]  . IR GENERIC HISTORICAL  11/18/2015   IR ANGIOGRAM SELECTIVE EACH ADDITIONAL VESSEL 11/18/2015 [facility]  . IR GENERIC HISTORICAL  11/18/2015   IR EMBO TUMOR ORGAN ISCHEMIA INFARCT INC GUIDE ROADMAPPING 11/18/2015 [facility]  . IR GENERIC HISTORICAL  11/18/2015   IR ANGIOGRAM PELVIS SELECTIVE OR SUPRASELECTIVE 11/18/2015 [facility]  . IR GENERIC HISTORICAL  11/18/2015   IR ANGIOGRAM SELECTIVE EACH ADDITIONAL VESSEL 11/18/2015 [facility]  . IR GENERIC HISTORICAL  11/18/2015   IR ANGIOGRAM PELVIS SELECTIVE OR SUPRASELECTIVE 11/18/2015 [facility]  . IR GENERIC HISTORICAL  02/23/2016   IR RADIOLOGIST EVAL & MGMT 02/23/2016 Jacqulynn Cadet, MD [facility]  . IR GENERIC HISTORICAL  12/02/2015   IR RADIOLOGIST EVAL & MGMT 12/02/2015 Greggory Keen, MD [facility]  . IR RADIOLOGIST EVAL & MGMT  08/24/2016  . WISDOM TOOTH  EXTRACTION     Social History: Social History   Socioeconomic History  . Marital status: Single    Spouse name: Not on file  . Number of children: 1  . Years of education: 2 yr coll  . Highest education level: Not on file  Occupational History  . Occupation: member Therapist, occupational    Comment: Forensic scientist  Social Needs  . Financial resource strain: Not on file  . Food insecurity:    Worry: Not on file    Inability: Not on file  . Transportation needs:    Medical: Not on file    Non-medical: Not on file  Tobacco Use  . Smoking status: Former Smoker    Packs/day: 0.50    Years: 15.00    Pack years: 7.50    Types: Cigarettes    Last attempt to quit: 04/20/2002    Years since quitting: 15.8  . Smokeless tobacco: Never Used  Substance and Sexual Activity  . Alcohol use: No  . Drug use: No  . Sexual activity: Not Currently    Birth control/protection: None  Lifestyle  . Physical activity:    Days per week: Not on file    Minutes per session: Not on file  . Stress: Not on file  Relationships  . Social connections:    Talks on phone: Not on file    Gets together: Not on file    Attends religious service: Not on  file    Active member of club or organization: Not on file    Attends meetings of clubs or organizations: Not on file    Relationship status: Not on file  Other Topics Concern  . Not on file  Social History Narrative   No regular exercise.  Lives with her mother [name] and father Billiejean Schimek   Family History: Family History  Problem Relation Age of Onset  . Hypertension Unknown   . Diabetes Father    Allergies: No Known Allergies Medications: See med rec.  Review of Systems: No fevers, chills, night sweats, weight loss, chest pain, or shortness of breath.   Objective:    General: Well Developed, well nourished, and in no acute distress.  Neuro: Alert and oriented x3, extra-ocular muscles intact, sensation grossly intact.  HEENT:  Normocephalic, atraumatic, pupils equal round reactive to light, neck supple, no masses, no lymphadenopathy, thyroid nonpalpable.  Skin: Warm and dry, no rashes. Cardiac: Regular rate and rhythm, no murmurs rubs or gallops, no lower extremity edema.  Respiratory: Clear to auscultation bilaterally. Not using accessory muscles, speaking in full sentences.  Impression and Recommendations:    Fibromyalgia Starting to get good relief from [facility] at 50 mg. Increasing to 100 mg twice daily. Continue gabapentin. Adding Sprix to use as needed. ___________________________________________ Gwen Her. Dianah Field, M.D., ABFM., CAQSM. Primary Care and Sports Medicine [facility]  Adjunct Professor of [place] of [facility] of Medicine

## 2018-02-28 NOTE — Assessment & Plan Note (Signed)
Starting to get good relief from [facility] at 50 mg. Increasing to 100 mg twice daily. Continue gabapentin. Adding Sprix to use as needed.

## 2018-03-07 DIAGNOSIS — M40292 Other kyphosis, cervical region: Secondary | ICD-10-CM | POA: Diagnosis not present

## 2018-03-07 DIAGNOSIS — M5116 Intervertebral disc disorders with radiculopathy, lumbar region: Secondary | ICD-10-CM | POA: Diagnosis not present

## 2018-03-07 DIAGNOSIS — M542 Cervicalgia: Secondary | ICD-10-CM | POA: Diagnosis not present

## 2018-03-07 DIAGNOSIS — M62830 Muscle spasm of back: Secondary | ICD-10-CM | POA: Diagnosis not present

## 2018-03-12 DIAGNOSIS — M40292 Other kyphosis, cervical region: Secondary | ICD-10-CM | POA: Diagnosis not present

## 2018-03-12 DIAGNOSIS — M5116 Intervertebral disc disorders with radiculopathy, lumbar region: Secondary | ICD-10-CM | POA: Diagnosis not present

## 2018-03-12 DIAGNOSIS — M542 Cervicalgia: Secondary | ICD-10-CM | POA: Diagnosis not present

## 2018-03-12 DIAGNOSIS — M62830 Muscle spasm of back: Secondary | ICD-10-CM | POA: Diagnosis not present

## 2018-03-14 DIAGNOSIS — M62830 Muscle spasm of back: Secondary | ICD-10-CM | POA: Diagnosis not present

## 2018-03-14 DIAGNOSIS — M542 Cervicalgia: Secondary | ICD-10-CM | POA: Diagnosis not present

## 2018-03-14 DIAGNOSIS — M40292 Other kyphosis, cervical region: Secondary | ICD-10-CM | POA: Diagnosis not present

## 2018-03-14 DIAGNOSIS — M5116 Intervertebral disc disorders with radiculopathy, lumbar region: Secondary | ICD-10-CM | POA: Diagnosis not present

## 2018-03-19 DIAGNOSIS — M62830 Muscle spasm of back: Secondary | ICD-10-CM | POA: Diagnosis not present

## 2018-03-19 DIAGNOSIS — M40292 Other kyphosis, cervical region: Secondary | ICD-10-CM | POA: Diagnosis not present

## 2018-03-19 DIAGNOSIS — M5116 Intervertebral disc disorders with radiculopathy, lumbar region: Secondary | ICD-10-CM | POA: Diagnosis not present

## 2018-03-19 DIAGNOSIS — M542 Cervicalgia: Secondary | ICD-10-CM | POA: Diagnosis not present

## 2018-03-22 ENCOUNTER — Ambulatory Visit
Admission: RE | Admit: 2018-03-22 | Discharge: 2018-03-22 | Disposition: A | Discharge status: Home / Self Care | Payer: BLUE CROSS/BLUE SHIELD | Source: Ambulatory Visit | Attending: Obstetrics & Gynecology | Admitting: Obstetrics & Gynecology

## 2018-03-22 DIAGNOSIS — Z1231 Encounter for screening mammogram for malignant neoplasm of breast: Secondary | ICD-10-CM | POA: Diagnosis not present

## 2018-03-22 DIAGNOSIS — M40292 Other kyphosis, cervical region: Secondary | ICD-10-CM | POA: Diagnosis not present

## 2018-03-22 DIAGNOSIS — M62830 Muscle spasm of back: Secondary | ICD-10-CM | POA: Diagnosis not present

## 2018-03-22 DIAGNOSIS — M542 Cervicalgia: Secondary | ICD-10-CM | POA: Diagnosis not present

## 2018-03-22 DIAGNOSIS — M5116 Intervertebral disc disorders with radiculopathy, lumbar region: Secondary | ICD-10-CM | POA: Diagnosis not present

## 2018-03-26 DIAGNOSIS — M542 Cervicalgia: Secondary | ICD-10-CM | POA: Diagnosis not present

## 2018-03-26 DIAGNOSIS — M5116 Intervertebral disc disorders with radiculopathy, lumbar region: Secondary | ICD-10-CM | POA: Diagnosis not present

## 2018-03-26 DIAGNOSIS — M40292 Other kyphosis, cervical region: Secondary | ICD-10-CM | POA: Diagnosis not present

## 2018-03-26 DIAGNOSIS — M62830 Muscle spasm of back: Secondary | ICD-10-CM | POA: Diagnosis not present

## 2018-03-29 ENCOUNTER — Ambulatory Visit: Payer: BLUE CROSS/BLUE SHIELD | Admitting: Sports Medicine

## 2018-03-29 ENCOUNTER — Encounter: Payer: Self-pay | Admitting: Sports Medicine

## 2018-03-29 DIAGNOSIS — M797 Fibromyalgia: Secondary | ICD-10-CM

## 2018-03-29 NOTE — Progress Notes (Signed)
Subjective:    CC: Follow-up  HPI: Fibromyalgia: Having some more pain, she is using her Savella and gabapentin as directed.  She did need some guidance on how to use the Sprix.  Obesity: Agreeable to look into gastric sleeve.  I reviewed the past medical history, family history, social history, surgical history, and allergies today and no changes were needed.  Please see the problem list section below in epic for further details.  Past Medical History: Past Medical History:  Diagnosis Date  . Anemia   . Back pain   . Complication of anesthesia    patient has not received anesthesia before but states father and brother have a hard time waking up from anesthesia  . Family history of adverse reaction to anesthesia    mother "has a hard time waking up and moody"   . Fibroid   . Headache   . Morbid obesity ([place])   . Pneumonia    Past Surgical History: Past Surgical History:  Procedure Laterality Date  . BREAST BIOPSY Right    benign  . CESAREAN SECTION  2000  . DILATION AND CURETTAGE OF UTERUS N/A 09/01/2015   Procedure: DILATATION AND CURETTAGE;  Surgeon: Emily Filbert, MD;  Location: [facility];  Service: Gynecology;  Laterality: N/A;  . IR GENERIC HISTORICAL  11/18/2015   IR US GUIDE VASC ACCESS RIGHT 11/18/2015 [facility]  . IR GENERIC HISTORICAL  11/18/2015   IR ANGIOGRAM SELECTIVE EACH ADDITIONAL VESSEL 11/18/2015 [facility]  . IR GENERIC HISTORICAL  11/18/2015   IR EMBO TUMOR ORGAN ISCHEMIA INFARCT INC GUIDE ROADMAPPING 11/18/2015 [facility]  . IR GENERIC HISTORICAL  11/18/2015   IR ANGIOGRAM PELVIS SELECTIVE OR SUPRASELECTIVE 11/18/2015 [facility]  . IR GENERIC HISTORICAL  11/18/2015   IR ANGIOGRAM SELECTIVE EACH ADDITIONAL VESSEL 11/18/2015 [facility]  . IR GENERIC HISTORICAL  11/18/2015   IR ANGIOGRAM PELVIS SELECTIVE OR SUPRASELECTIVE 11/18/2015 [facility]  . IR GENERIC HISTORICAL  02/23/2016   IR RADIOLOGIST EVAL & MGMT 02/23/2016 Jacqulynn Cadet, MD [facility]  . IR GENERIC HISTORICAL  12/02/2015   IR RADIOLOGIST EVAL & MGMT 12/02/2015 Greggory Keen, MD [facility]  . IR RADIOLOGIST EVAL & MGMT  08/24/2016  . WISDOM TOOTH EXTRACTION     Social History: Social History   Socioeconomic History  . Marital status: Single    Spouse name: Not on file  . Number of children: 1  . Years of education: 2 yr coll  . Highest education level: Not on file  Occupational History  . Occupation: member Therapist, occupational    Comment: Forensic scientist  Social Needs  . Financial resource strain: Not on file  . Food insecurity:    Worry: Not on file    Inability: Not on file  . Transportation needs:    Medical: Not on file    Non-medical: Not on file  Tobacco Use  . Smoking status: Former Smoker    Packs/day: 0.50    Years: 15.00    Pack years: 7.50    Types: Cigarettes    Last attempt to quit: 04/20/2002    Years since quitting: 15.9  . Smokeless tobacco: Never Used  Substance and Sexual Activity  . Alcohol use: No  . Drug use: No  . Sexual activity: Not Currently    Birth control/protection: None  Lifestyle  . Physical activity:    Days per week: Not on file    Minutes per session: Not on file  .  Stress: Not on file  Relationships  . Social connections:    Talks on phone: Not on file    Gets together: Not on file    Attends religious service: Not on file    Active member of club or organization: Not on file    Attends meetings of clubs or organizations: Not on file    Relationship status: Not on file  Other Topics Concern  . Not on file  Social History Narrative   No regular exercise.  Lives with her mother [name] and father Ivee Poellnitz   Family History: Family History  Problem Relation Age of Onset  . Hypertension Other   . Diabetes Father    Allergies: No Known Allergies Medications: See med rec.  Review of Systems: No fevers, chills, night sweats, weight loss, chest pain, or shortness of breath.    Objective:    General: Well Developed, well nourished, and in no acute distress.  Neuro: Alert and oriented x3, extra-ocular muscles intact, sensation grossly intact.  HEENT: Normocephalic, atraumatic, pupils equal round reactive to light, neck supple, no masses, no lymphadenopathy, thyroid nonpalpable.  Skin: Warm and dry, no rashes. Cardiac: Regular rate and rhythm, no murmurs rubs or gallops, no lower extremity edema.  Respiratory: Clear to auscultation bilaterally. Not using accessory muscles, speaking in full sentences.  Impression and Recommendations:    Fibromyalgia Continue Savella 100 twice a day, continue gabapentin. She will continue to use Sprix as needed. She is under a great deal of stress from work so we are not going to make any changes today, return in 2 months. He does understand that stress is intimately related to pain symptoms from fibromyalgia.  Morbid obesity ([place]) I would like her to look into gastric sleeve surgery. This would likely cure her blood pressure, as well as back pain complaints.  ___________________________________________ Gwen Her. Dianah Field, M.D., ABFM., CAQSM. Primary Care and Sports Medicine [facility]  Adjunct Professor of [place] of [facility] of Medicine

## 2018-03-29 NOTE — Assessment & Plan Note (Signed)
Continue Savella 100 twice a day, continue gabapentin. She will continue to use Sprix as needed. She is under a great deal of stress from work so we are not going to make any changes today, return in 2 months. He does understand that stress is intimately related to pain symptoms from fibromyalgia.

## 2018-03-29 NOTE — Assessment & Plan Note (Signed)
I would like her to look into gastric sleeve surgery. This would likely cure her blood pressure, as well as back pain complaints.

## 2018-04-09 DIAGNOSIS — M62830 Muscle spasm of back: Secondary | ICD-10-CM | POA: Diagnosis not present

## 2018-04-09 DIAGNOSIS — M5116 Intervertebral disc disorders with radiculopathy, lumbar region: Secondary | ICD-10-CM | POA: Diagnosis not present

## 2018-04-09 DIAGNOSIS — M542 Cervicalgia: Secondary | ICD-10-CM | POA: Diagnosis not present

## 2018-04-09 DIAGNOSIS — M40292 Other kyphosis, cervical region: Secondary | ICD-10-CM | POA: Diagnosis not present

## 2018-04-11 DIAGNOSIS — M542 Cervicalgia: Secondary | ICD-10-CM | POA: Diagnosis not present

## 2018-04-11 DIAGNOSIS — M5116 Intervertebral disc disorders with radiculopathy, lumbar region: Secondary | ICD-10-CM | POA: Diagnosis not present

## 2018-04-11 DIAGNOSIS — M40292 Other kyphosis, cervical region: Secondary | ICD-10-CM | POA: Diagnosis not present

## 2018-04-11 DIAGNOSIS — M62830 Muscle spasm of back: Secondary | ICD-10-CM | POA: Diagnosis not present

## 2018-04-14 ENCOUNTER — Other Ambulatory Visit: Payer: Self-pay | Admitting: Family Medicine

## 2018-04-14 DIAGNOSIS — I10 Essential (primary) hypertension: Secondary | ICD-10-CM

## 2018-04-16 DIAGNOSIS — M5116 Intervertebral disc disorders with radiculopathy, lumbar region: Secondary | ICD-10-CM | POA: Diagnosis not present

## 2018-04-16 DIAGNOSIS — M542 Cervicalgia: Secondary | ICD-10-CM | POA: Diagnosis not present

## 2018-04-16 DIAGNOSIS — M62830 Muscle spasm of back: Secondary | ICD-10-CM | POA: Diagnosis not present

## 2018-04-16 DIAGNOSIS — M40292 Other kyphosis, cervical region: Secondary | ICD-10-CM | POA: Diagnosis not present

## 2018-04-18 DIAGNOSIS — M542 Cervicalgia: Secondary | ICD-10-CM | POA: Diagnosis not present

## 2018-04-18 DIAGNOSIS — M5116 Intervertebral disc disorders with radiculopathy, lumbar region: Secondary | ICD-10-CM | POA: Diagnosis not present

## 2018-04-18 DIAGNOSIS — M40292 Other kyphosis, cervical region: Secondary | ICD-10-CM | POA: Diagnosis not present

## 2018-04-18 DIAGNOSIS — M62830 Muscle spasm of back: Secondary | ICD-10-CM | POA: Diagnosis not present

## 2018-04-23 DIAGNOSIS — M5116 Intervertebral disc disorders with radiculopathy, lumbar region: Secondary | ICD-10-CM | POA: Diagnosis not present

## 2018-04-23 DIAGNOSIS — M40292 Other kyphosis, cervical region: Secondary | ICD-10-CM | POA: Diagnosis not present

## 2018-04-23 DIAGNOSIS — M62830 Muscle spasm of back: Secondary | ICD-10-CM | POA: Diagnosis not present

## 2018-04-23 DIAGNOSIS — M542 Cervicalgia: Secondary | ICD-10-CM | POA: Diagnosis not present

## 2018-04-30 DIAGNOSIS — M542 Cervicalgia: Secondary | ICD-10-CM | POA: Diagnosis not present

## 2018-04-30 DIAGNOSIS — M40292 Other kyphosis, cervical region: Secondary | ICD-10-CM | POA: Diagnosis not present

## 2018-04-30 DIAGNOSIS — M5116 Intervertebral disc disorders with radiculopathy, lumbar region: Secondary | ICD-10-CM | POA: Diagnosis not present

## 2018-04-30 DIAGNOSIS — M62830 Muscle spasm of back: Secondary | ICD-10-CM | POA: Diagnosis not present

## 2018-05-02 DIAGNOSIS — M5116 Intervertebral disc disorders with radiculopathy, lumbar region: Secondary | ICD-10-CM | POA: Diagnosis not present

## 2018-05-02 DIAGNOSIS — M542 Cervicalgia: Secondary | ICD-10-CM | POA: Diagnosis not present

## 2018-05-02 DIAGNOSIS — M40292 Other kyphosis, cervical region: Secondary | ICD-10-CM | POA: Diagnosis not present

## 2018-05-02 DIAGNOSIS — M62830 Muscle spasm of back: Secondary | ICD-10-CM | POA: Diagnosis not present

## 2018-05-09 ENCOUNTER — Ambulatory Visit: Payer: BLUE CROSS/BLUE SHIELD | Admitting: Physician Assistant

## 2018-05-09 ENCOUNTER — Encounter: Payer: Self-pay | Admitting: Physician Assistant

## 2018-05-09 VITALS — BP 127/92 | HR 95 | Ht 63.0 in | Wt 228.0 lb

## 2018-05-09 DIAGNOSIS — T300 Burn of unspecified body region, unspecified degree: Secondary | ICD-10-CM

## 2018-05-09 MED ORDER — SILVER SULFADIAZINE 1 % EX CREA: 1.0000 "application " | TOPICAL_CREAM | Freq: Every day | CUTANEOUS | 0 refills | Status: DC

## 2018-05-09 MED ORDER — CEPHALEXIN 500 MG PO CAPS: 500.0000 mg | ORAL_CAPSULE | Freq: Two times a day (BID) | ORAL | 0 refills | Status: DC
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
## 2018-05-09 NOTE — Patient Instructions (Signed)
Burn Care, Adult  A burn is an injury to the skin or the tissues under the skin. There are three types of burns:  · First degree. These burns may cause the skin to be red and a bit swollen.  · Second degree. These burns are very painful and cause the skin to be very red. The skin may also leak fluid, look shiny, and start to have blisters.  · Third degree. These burns cause permanent damage. They turn the skin white or black and make it look charred, dry, and leathery.  Taking care of your burn properly can help to prevent pain and infection. It can also help the burn to heal more quickly.  How is this treated?  Right after a burn:  · Rinse or soak the burn under cool water. Do this for several minutes. Do not put ice on your burn. That can cause more damage.  · Lightly cover the burn with a clean (sterile) cloth (dressing).  Burn care  · Raise (elevate) the injured area above the level of your heart while sitting or lying down.  · Follow instructions from your doctor about:  ? How to clean and take care of the burn.  ? When to change and remove the cloth.  · Check your burn every day for signs of infection. Check for:  ? More redness, swelling, or pain.  ? Warmth.  ? Pus or a bad smell.  Medicine    · Take over-the-counter and prescription medicines only as told by your doctor.  · If you were prescribed antibiotic medicine, take or apply it as told by your doctor. Do not stop using the antibiotic even if your condition improves.  General instructions  · To prevent infection:  ? Do not put butter, oil, or other home treatments on the burn.  ? Do not scratch or pick at the burn.  ? Do not break any blisters.  ? Do not peel skin.  · Do not rub your burn, even when you are cleaning it.  · Protect your burn from the sun.  Contact a doctor if:  · Your condition does not get better.  · Your condition gets worse.  · You have a fever.  · Your burn looks different or starts to have black or red spots on it.  · Your burn  feels warm to the touch.  · Your pain is not controlled with medicine.  Get help right away if:  · You have redness, swelling, or pain at the site of the burn.  · You have fluid, blood, or pus coming from your burn.  · You have red streaks near the burn.  · You have very bad pain.  This information is not intended to replace advice given to you by your health care provider. Make sure you discuss any questions you have with your health care provider.  Document Released: 12/29/2007 Document Revised: 11/01/2016 Document Reviewed: 09/08/2015  Elsevier Interactive Patient Education © 2019 Elsevier Inc.

## 2018-05-14 DIAGNOSIS — M62830 Muscle spasm of back: Secondary | ICD-10-CM | POA: Diagnosis not present

## 2018-05-14 DIAGNOSIS — M542 Cervicalgia: Secondary | ICD-10-CM | POA: Diagnosis not present

## 2018-05-14 DIAGNOSIS — M40292 Other kyphosis, cervical region: Secondary | ICD-10-CM | POA: Diagnosis not present

## 2018-05-14 DIAGNOSIS — M5116 Intervertebral disc disorders with radiculopathy, lumbar region: Secondary | ICD-10-CM | POA: Diagnosis not present

## 2018-05-16 DIAGNOSIS — M542 Cervicalgia: Secondary | ICD-10-CM | POA: Diagnosis not present

## 2018-05-16 DIAGNOSIS — M5116 Intervertebral disc disorders with radiculopathy, lumbar region: Secondary | ICD-10-CM | POA: Diagnosis not present

## 2018-05-16 DIAGNOSIS — M62830 Muscle spasm of back: Secondary | ICD-10-CM | POA: Diagnosis not present

## 2018-05-16 DIAGNOSIS — M40292 Other kyphosis, cervical region: Secondary | ICD-10-CM | POA: Diagnosis not present

## 2018-05-30 ENCOUNTER — Ambulatory Visit: Payer: BLUE CROSS/BLUE SHIELD | Admitting: Sports Medicine

## 2018-06-13 ENCOUNTER — Ambulatory Visit: Payer: BLUE CROSS/BLUE SHIELD | Admitting: Sports Medicine

## 2018-06-13 ENCOUNTER — Encounter: Payer: BLUE CROSS/BLUE SHIELD | Admitting: Family Medicine

## 2018-06-13 DIAGNOSIS — F4322 Adjustment disorder with anxiety: Secondary | ICD-10-CM | POA: Diagnosis not present

## 2018-06-25 DIAGNOSIS — M542 Cervicalgia: Secondary | ICD-10-CM | POA: Diagnosis not present

## 2018-06-25 DIAGNOSIS — M62830 Muscle spasm of back: Secondary | ICD-10-CM | POA: Diagnosis not present

## 2018-06-25 DIAGNOSIS — M5116 Intervertebral disc disorders with radiculopathy, lumbar region: Secondary | ICD-10-CM | POA: Diagnosis not present

## 2018-06-25 DIAGNOSIS — M40292 Other kyphosis, cervical region: Secondary | ICD-10-CM | POA: Diagnosis not present

## 2018-07-14 ENCOUNTER — Other Ambulatory Visit: Payer: Self-pay | Admitting: Family Medicine

## 2018-07-14 DIAGNOSIS — I10 Essential (primary) hypertension: Secondary | ICD-10-CM

## 2018-07-16 ENCOUNTER — Telehealth: Payer: Self-pay | Admitting: *Deleted

## 2018-07-16 MED ORDER — LISINOPRIL-HYDROCHLOROTHIAZIDE 20-12.5 MG PO TABS: 1.0000 | ORAL_TABLET | Freq: Every day | ORAL | 0 refills | Status: DC

## 2018-07-16 NOTE — Telephone Encounter (Signed)
Will refill pt's bp med for 30. This will be her last refill.Marland KitchenMarland KitchenElouise Copeland, [place]

## 2018-07-16 NOTE — Telephone Encounter (Signed)
On 05/24/2018 pt stated the following: Mother request we "cancel upcoming appointments for [place] and herself." States they are "looking elsewhere for care". Will update chart and PCP listed on file for both.  Maryruth Eve, Lahoma Crocker, CMA

## 2018-08-06 DIAGNOSIS — M542 Cervicalgia: Secondary | ICD-10-CM | POA: Diagnosis not present

## 2018-08-06 DIAGNOSIS — M5116 Intervertebral disc disorders with radiculopathy, lumbar region: Secondary | ICD-10-CM | POA: Diagnosis not present

## 2018-08-06 DIAGNOSIS — M62830 Muscle spasm of back: Secondary | ICD-10-CM | POA: Diagnosis not present

## 2018-08-06 DIAGNOSIS — M40292 Other kyphosis, cervical region: Secondary | ICD-10-CM | POA: Diagnosis not present

## 2018-08-07 DIAGNOSIS — M40292 Other kyphosis, cervical region: Secondary | ICD-10-CM | POA: Diagnosis not present

## 2018-08-07 DIAGNOSIS — M62830 Muscle spasm of back: Secondary | ICD-10-CM | POA: Diagnosis not present

## 2018-08-07 DIAGNOSIS — M542 Cervicalgia: Secondary | ICD-10-CM | POA: Diagnosis not present

## 2018-08-07 DIAGNOSIS — M5116 Intervertebral disc disorders with radiculopathy, lumbar region: Secondary | ICD-10-CM | POA: Diagnosis not present

## 2018-08-08 DIAGNOSIS — M542 Cervicalgia: Secondary | ICD-10-CM | POA: Diagnosis not present

## 2018-08-08 DIAGNOSIS — M62830 Muscle spasm of back: Secondary | ICD-10-CM | POA: Diagnosis not present

## 2018-08-08 DIAGNOSIS — M5116 Intervertebral disc disorders with radiculopathy, lumbar region: Secondary | ICD-10-CM | POA: Diagnosis not present

## 2018-08-08 DIAGNOSIS — M40292 Other kyphosis, cervical region: Secondary | ICD-10-CM | POA: Diagnosis not present

## 2018-08-13 DIAGNOSIS — M5116 Intervertebral disc disorders with radiculopathy, lumbar region: Secondary | ICD-10-CM | POA: Diagnosis not present

## 2018-08-13 DIAGNOSIS — M62830 Muscle spasm of back: Secondary | ICD-10-CM | POA: Diagnosis not present

## 2018-08-13 DIAGNOSIS — M542 Cervicalgia: Secondary | ICD-10-CM | POA: Diagnosis not present

## 2018-08-13 DIAGNOSIS — M40292 Other kyphosis, cervical region: Secondary | ICD-10-CM | POA: Diagnosis not present

## 2018-08-14 ENCOUNTER — Ambulatory Visit: Payer: BLUE CROSS/BLUE SHIELD | Admitting: Sports Medicine

## 2018-08-14 ENCOUNTER — Encounter: Payer: Self-pay | Admitting: Sports Medicine

## 2018-08-14 DIAGNOSIS — E8881 Metabolic syndrome: Secondary | ICD-10-CM | POA: Diagnosis not present

## 2018-08-14 DIAGNOSIS — M797 Fibromyalgia: Secondary | ICD-10-CM

## 2018-08-14 MED ORDER — DICLOFENAC POTASSIUM 25 MG PO CAPS: 1.0000 | ORAL_CAPSULE | Freq: Four times a day (QID) | ORAL | 0 refills | Status: DC | PRN

## 2018-08-14 MED ORDER — SEMAGLUTIDE (1 MG/DOSE) 2 MG/1.5ML ~~LOC~~ SOPN: 1.0000 mg | PEN_INJECTOR | SUBCUTANEOUS | 11 refills | Status: DC

## 2018-08-14 MED ORDER — SEMAGLUTIDE(0.25 OR 0.5MG/DOS) 2 MG/1.5ML ~~LOC~~ SOPN: 0.5000 mg | PEN_INJECTOR | SUBCUTANEOUS | 1 refills | Status: DC

## 2018-08-14 NOTE — Assessment & Plan Note (Signed)
At this point well controlled with Savella 100 twice a day, gabapentin. She does miss doses occasionally, I have advised her to be as compliant as she can with the twice a day dosing even if the timing is off. Sprix was not helpful, adding Zipsor for use as needed.

## 2018-08-14 NOTE — Assessment & Plan Note (Signed)
Adding metformin and Ozempic, she can follow this up with her PCP.

## 2018-08-14 NOTE — Progress Notes (Signed)
Subjective:    CC: Follow-up  HPI: Fibromyalgia: Adequately controlled on Savella, gabapentin.  She has questionable compliance with her Ocie [place] and agrees to do better.  Sprix did not help.  Abnormal weight gain: Did well on Saxenda in the past, would like to try something similar.  I reviewed the past medical history, family history, social history, surgical history, and allergies today and no changes were needed.  Please see the problem list section below in epic for further details.  Past Medical History: Past Medical History:  Diagnosis Date  . Anemia   . Back pain   . Complication of anesthesia    patient has not received anesthesia before but states father and brother have a hard time waking up from anesthesia  . Family history of adverse reaction to anesthesia    mother "has a hard time waking up and moody"   . Fibroid   . Headache   . Morbid obesity ([place])   . Pneumonia    Past Surgical History: Past Surgical History:  Procedure Laterality Date  . BREAST BIOPSY Right    benign  . CESAREAN SECTION  2000  . DILATION AND CURETTAGE OF UTERUS N/A 09/01/2015   Procedure: DILATATION AND CURETTAGE;  Surgeon: Emily Filbert, MD;  Location: [facility];  Service: Gynecology;  Laterality: N/A;  . IR GENERIC HISTORICAL  11/18/2015   IR US GUIDE VASC ACCESS RIGHT 11/18/2015 [facility]  . IR GENERIC HISTORICAL  11/18/2015   IR ANGIOGRAM SELECTIVE EACH ADDITIONAL VESSEL 11/18/2015 [facility]  . IR GENERIC HISTORICAL  11/18/2015   IR EMBO TUMOR ORGAN ISCHEMIA INFARCT INC GUIDE ROADMAPPING 11/18/2015 [facility]  . IR GENERIC HISTORICAL  11/18/2015   IR ANGIOGRAM PELVIS SELECTIVE OR SUPRASELECTIVE 11/18/2015 [facility]  . IR GENERIC HISTORICAL  11/18/2015   IR ANGIOGRAM SELECTIVE EACH ADDITIONAL VESSEL 11/18/2015 [facility]  . IR GENERIC HISTORICAL  11/18/2015   IR ANGIOGRAM PELVIS SELECTIVE OR SUPRASELECTIVE 11/18/2015 [facility]  . IR GENERIC HISTORICAL  02/23/2016   IR  RADIOLOGIST EVAL & MGMT 02/23/2016 Jacqulynn Cadet, MD [facility]  . IR GENERIC HISTORICAL  12/02/2015   IR RADIOLOGIST EVAL & MGMT 12/02/2015 Greggory Keen, MD [facility]  . IR RADIOLOGIST EVAL & MGMT  08/24/2016  . WISDOM TOOTH EXTRACTION     Social History: Social History   Socioeconomic History  . Marital status: Single    Spouse name: Not on file  . Number of children: 1  . Years of education: 2 yr coll  . Highest education level: Not on file  Occupational History  . Occupation: member Therapist, occupational    Comment: Forensic scientist  Social Needs  . Financial resource strain: Not on file  . Food insecurity:    Worry: Not on file    Inability: Not on file  . Transportation needs:    Medical: Not on file    Non-medical: Not on file  Tobacco Use  . Smoking status: Former Smoker    Packs/day: 0.50    Years: 15.00    Pack years: 7.50    Types: Cigarettes    Last attempt to quit: 04/20/2002    Years since quitting: 16.3  . Smokeless tobacco: Never Used  Substance and Sexual Activity  . Alcohol use: No  . Drug use: No  . Sexual activity: Not Currently    Birth control/protection: None  Lifestyle  . Physical activity:    Days per week: Not on file  Minutes per session: Not on file  . Stress: Not on file  Relationships  . Social connections:    Talks on phone: Not on file    Gets together: Not on file    Attends religious service: Not on file    Active member of club or organization: Not on file    Attends meetings of clubs or organizations: Not on file    Relationship status: Not on file  Other Topics Concern  . Not on file  Social History Narrative   No regular exercise.  Lives with her mother [name] and father Dayami Taitt   Family History: Family History  Problem Relation Age of Onset  . Hypertension Other   . Diabetes Father    Allergies: No Known Allergies Medications: See med rec.  Review of Systems: No fevers, chills,  night sweats, weight loss, chest pain, or shortness of breath.   Objective:    General: Well Developed, well nourished, and in no acute distress.  Neuro: Alert and oriented x3, extra-ocular muscles intact, sensation grossly intact.  HEENT: Normocephalic, atraumatic, pupils equal round reactive to light, neck supple, no masses, no lymphadenopathy, thyroid nonpalpable.  Skin: Warm and dry, no rashes. Cardiac: Regular rate and rhythm, no murmurs rubs or gallops, no lower extremity edema.  Respiratory: Clear to auscultation bilaterally. Not using accessory muscles, speaking in full sentences.  Impression and Recommendations:    Fibromyalgia At this point well controlled with Savella 100 twice a day, gabapentin. She does miss doses occasionally, I have advised her to be as compliant as she can with the twice a day dosing even if the timing is off. Sprix was not helpful, adding Zipsor for use as needed.   Metabolic syndrome Adding metformin and Ozempic, she can follow this up with her PCP.  I spent 25 minutes with this patient, greater than 50% was face-to-face time counseling regarding the above diagnoses.  ___________________________________________ Gwen Her. Dianah Field, M.D., ABFM., CAQSM. Primary Care and Sports Medicine [facility]  Adjunct Professor of [employer] [facility] [employer]

## 2018-08-16 NOTE — Telephone Encounter (Signed)
Left message that physical appointment had to be canceled since patient did not want care from [facility] any longer per message below. Told patient to call us back so we can further explain this.

## 2018-08-17 ENCOUNTER — Ambulatory Visit (INDEPENDENT_AMBULATORY_CARE_PROVIDER_SITE_OTHER): Payer: BLUE CROSS/BLUE SHIELD | Admitting: Family Medicine

## 2018-08-17 ENCOUNTER — Telehealth: Payer: Self-pay

## 2018-08-17 ENCOUNTER — Encounter: Payer: Self-pay | Admitting: Family Medicine

## 2018-08-17 DIAGNOSIS — I10 Essential (primary) hypertension: Secondary | ICD-10-CM | POA: Diagnosis not present

## 2018-08-17 DIAGNOSIS — K760 Fatty (change of) liver, not elsewhere classified: Secondary | ICD-10-CM | POA: Diagnosis not present

## 2018-08-17 DIAGNOSIS — Z6841 Body Mass Index (BMI) 40.0 and over, adult: Secondary | ICD-10-CM

## 2018-08-17 MED ORDER — SEMAGLUTIDE(0.25 OR 0.5MG/DOS) 2 MG/1.5ML ~~LOC~~ SOPN: 1.0000 | PEN_INJECTOR | SUBCUTANEOUS | 1 refills | Status: DC

## 2018-08-17 MED ORDER — LISINOPRIL-HYDROCHLOROTHIAZIDE 20-12.5 MG PO TABS: 1.0000 | ORAL_TABLET | Freq: Every day | ORAL | 2 refills | Status: DC

## 2018-08-17 NOTE — Telephone Encounter (Signed)
Starting dose for Ozempic was written as follows:  "Inject 0.5 mg into the skin once a week. Inject 0.25 mg subq qwk for 4 wk then inject 0.5 mg subq qwk x4 wk, then go to full dose pen qwk"  Above bolded sentence is the direction in question by the pharmacist. Is the first sentence supposed to be voided?

## 2018-08-17 NOTE — Progress Notes (Signed)
Virtual Visit via Video Note  I connected with Elizabeth Copeland on 08/17/18 at  9:50 AM EDT by a video enabled telemedicine application and verified that I am speaking with the correct person using two identifiers.   I discussed the limitations of evaluation and management by telemedicine and the availability of in person appointments. The patient expressed understanding and agreed to proceed.  Subjective:    CC: F/U Meds   HPI:  Hypertension- Pt denies chest pain, SOB, dizziness, or heart palpitations.  Taking meds as directed w/o problems.  Denies medication side effects.  Her blood pressure looked great when hear earlier this month.  She is happy with her current regimen and does not have any concerns.  She recently followed up with Dr. Dianah Field for her fibromyalgia and felt like she was doing well on her Savella and gabapentin. Has been using Sport and exercise psychologist before COVID. She has put on COVID.   They also discussed her weight gain and wanted to get back on Saxenda.  He added metformin to Ozempic and encouraged her to follow-up with PCP.    Past medical history, Surgical history, Family history not pertinant except as noted below, Social history, Allergies, and medications have been entered into the medical record, reviewed, and corrections made.   Review of Systems: No fevers, chills, night sweats, weight loss, chest pain, or shortness of breath.   Objective:    General: Speaking clearly in complete sentences without any shortness of breath.  Alert and oriented x3.  Normal judgment. No apparent acute distress.    Impression and Recommendations:    HTN -continue current regimen blood pressure looks fantastic.  I make sure she is got refill sent to the pharmacy.  We also need to get blood work updated.  Fatty liver-she has gained a little bit of weight back recently with COVID but had lost some on weight watchers previously.  Just encouraged her to get back on track.  I would  definitely like to recheck her liver enzymes and will address further if needed.  BMI 40-she has been having some problems getting the Ozempic at the pharmacy that Dr. Dianah Field had originally written for her.  If she is doing well that I like to follow-up with her and again in about 4 to 6 weeks to make sure she is doing well and back on track.         I discussed the assessment and treatment plan with the patient. The patient was provided an opportunity to ask questions and all were answered. The patient agreed with the plan and demonstrated an understanding of the instructions.   The patient was advised to call back or seek an in-person evaluation if the symptoms worsen or if the condition fails to improve as anticipated.   Beatrice Lecher, MD

## 2018-08-17 NOTE — Telephone Encounter (Signed)
Thanks! Pt advised

## 2018-08-17 NOTE — Telephone Encounter (Signed)
Yes, the initial Sig should be the unbolded part.  Resending.

## 2018-08-20 ENCOUNTER — Encounter: Payer: BLUE CROSS/BLUE SHIELD | Admitting: Family Medicine

## 2018-08-22 NOTE — Telephone Encounter (Signed)
Please contact Loa Socks the Ozempic rep so he can fix this.  rep Loa Socks [phone])

## 2018-08-22 NOTE — Telephone Encounter (Signed)
Received a fax from insurance that Elizabeth Copeland will only be covered for Type 2 Diabetes. It will not be covered for Basal Metabolic Syndrome at all since this is not a covered diagnoses on her plan. FYI.

## 2018-08-22 NOTE — Telephone Encounter (Signed)
I called him and he states that the only way he knows is to get samples since he reports this is harder to get approved and he does not have any new information to give insurance to push this through. He can call you one day and get samples shipped. Please advise.  Patient has been called and message left to call back.

## 2018-08-22 NOTE — Telephone Encounter (Signed)
Samples would be great!

## 2018-08-24 NOTE — Telephone Encounter (Signed)
Patient was notified and is ok with getting samples to start the Lithium.  Elizabeth Copeland will call Dr. Darene Lamer this afternoon to get samples and patient is aware she will get a call when they are in the office.

## 2018-09-12 ENCOUNTER — Telehealth: Payer: Self-pay | Admitting: Sports Medicine

## 2018-09-12 DIAGNOSIS — Z6841 Body Mass Index (BMI) 40.0 and over, adult: Secondary | ICD-10-CM

## 2018-09-12 DIAGNOSIS — M797 Fibromyalgia: Secondary | ICD-10-CM

## 2018-09-12 MED ORDER — LIRAGLUTIDE -WEIGHT MANAGEMENT 18 MG/3ML ~~LOC~~ SOPN: 3.0000 mg | PEN_INJECTOR | Freq: Every day | SUBCUTANEOUS | 0 refills | Status: DC

## 2018-09-12 MED ORDER — DICLOFENAC POTASSIUM 25 MG PO CAPS: 1.0000 | ORAL_CAPSULE | Freq: Four times a day (QID) | ORAL | 3 refills | Status: DC | PRN

## 2018-09-12 NOTE — Telephone Encounter (Signed)
I talked with the patient and her insurance will not cover Ozempic for the Basal Metabolic Syndrome.  Patient wants to know the next steps. I see she has tried [place] in the past.  She is also low on the Zipsor and wants to know if she will need a refill sent to [facility]. She reports that it helps. Please advise.

## 2018-09-12 NOTE — Assessment & Plan Note (Signed)
Unable to get Ozempic approved for metabolic syndrome/obesity. Switching to Saxenda. She has failed other medications and will be part of a comprehensive calorie counting and weight loss program.

## 2018-09-12 NOTE — Telephone Encounter (Signed)
Switching to Saxenda, refilling Zipsor.  The patient will need to download and activated discount coupon for Saxenda and take it to the pharmacy with her.  She should try to do the same for Zipsor.

## 2018-09-13 NOTE — Telephone Encounter (Signed)
Left message for patient to call back about note below.

## 2018-09-14 ENCOUNTER — Other Ambulatory Visit: Payer: Self-pay | Admitting: Sports Medicine

## 2018-09-14 MED ORDER — ZIPSOR 25 MG PO CAPS: 1.0000 | ORAL_CAPSULE | Freq: Four times a day (QID) | ORAL | 3 refills | Status: DC | PRN

## 2018-09-17 NOTE — Telephone Encounter (Signed)
Patient has picked up the [place] per [place] at [facility].

## 2018-09-27 ENCOUNTER — Other Ambulatory Visit: Payer: Self-pay

## 2018-09-27 ENCOUNTER — Ambulatory Visit: Payer: BLUE CROSS/BLUE SHIELD | Admitting: Sports Medicine

## 2018-09-27 ENCOUNTER — Encounter: Payer: Self-pay | Admitting: Sports Medicine

## 2018-09-27 ENCOUNTER — Ambulatory Visit (INDEPENDENT_AMBULATORY_CARE_PROVIDER_SITE_OTHER): Payer: BC Managed Care – PPO

## 2018-09-27 DIAGNOSIS — M79672 Pain in left foot: Secondary | ICD-10-CM

## 2018-09-27 MED ORDER — TRAMADOL HCL 50 MG PO TABS: 50.0000 mg | ORAL_TABLET | Freq: Three times a day (TID) | ORAL | 0 refills | Status: DC | PRN
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
## 2018-09-27 NOTE — Assessment & Plan Note (Addendum)
ASO, tramadol for pain, x-rays. I will keep an eye for widening of the Lisfranc joint on x-rays, and if persistent discomfort at the follow-up visit we will consider MRI looking for T2 edema at the base of the second metatarsal. Return to see me in 2 weeks.

## 2018-10-11 ENCOUNTER — Other Ambulatory Visit: Payer: Self-pay

## 2018-10-11 ENCOUNTER — Encounter: Payer: Self-pay | Admitting: Sports Medicine

## 2018-10-11 ENCOUNTER — Ambulatory Visit (INDEPENDENT_AMBULATORY_CARE_PROVIDER_SITE_OTHER): Payer: BC Managed Care – PPO | Admitting: Sports Medicine

## 2018-10-11 DIAGNOSIS — M79672 Pain in left foot: Secondary | ICD-10-CM | POA: Diagnosis not present

## 2018-10-11 DIAGNOSIS — M722 Plantar fascial fibromatosis: Secondary | ICD-10-CM | POA: Diagnosis not present

## 2018-10-11 NOTE — Progress Notes (Signed)
Subjective:    CC: Follow-up  HPI: This is a very pleasant 48 year old female, we treated her for a midfoot sprain which has resolved, she does have some pain that she localizes on the plantar aspect of her heel, moderate, persistent, localized without radiation, worse at the first few steps in the morning.  I reviewed the past medical history, family history, social history, surgical history, and allergies today and no changes were needed.  Please see the problem list section below in epic for further details.  Past Medical History: Past Medical History:  Diagnosis Date  . Anemia   . Back pain   . Complication of anesthesia    patient has not received anesthesia before but states father and brother have a hard time waking up from anesthesia  . Family history of adverse reaction to anesthesia    mother "has a hard time waking up and moody"   . Fibroid   . Headache   . Morbid obesity ([place])   . Pneumonia    Past Surgical History: Past Surgical History:  Procedure Laterality Date  . BREAST BIOPSY Right    benign  . CESAREAN SECTION  2000  . DILATION AND CURETTAGE OF UTERUS N/A 09/01/2015   Procedure: DILATATION AND CURETTAGE;  Surgeon: Emily Filbert, MD;  Location: [facility];  Service: Gynecology;  Laterality: N/A;  . IR GENERIC HISTORICAL  11/18/2015   IR US GUIDE VASC ACCESS RIGHT 11/18/2015 [facility]  . IR GENERIC HISTORICAL  11/18/2015   IR ANGIOGRAM SELECTIVE EACH ADDITIONAL VESSEL 11/18/2015 [facility]  . IR GENERIC HISTORICAL  11/18/2015   IR EMBO TUMOR ORGAN ISCHEMIA INFARCT INC GUIDE ROADMAPPING 11/18/2015 [facility]  . IR GENERIC HISTORICAL  11/18/2015   IR ANGIOGRAM PELVIS SELECTIVE OR SUPRASELECTIVE 11/18/2015 [facility]  . IR GENERIC HISTORICAL  11/18/2015   IR ANGIOGRAM SELECTIVE EACH ADDITIONAL VESSEL 11/18/2015 [facility]  . IR GENERIC HISTORICAL  11/18/2015   IR ANGIOGRAM PELVIS SELECTIVE OR SUPRASELECTIVE 11/18/2015 [facility]  . IR GENERIC HISTORICAL   02/23/2016   IR RADIOLOGIST EVAL & MGMT 02/23/2016 Jacqulynn Cadet, MD [facility]  . IR GENERIC HISTORICAL  12/02/2015   IR RADIOLOGIST EVAL & MGMT 12/02/2015 Greggory Keen, MD [facility]  . IR RADIOLOGIST EVAL & MGMT  08/24/2016  . WISDOM TOOTH EXTRACTION     Social History: Social History   Socioeconomic History  . Marital status: Single    Spouse name: Not on file  . Number of children: 1  . Years of education: 2 yr coll  . Highest education level: Not on file  Occupational History  . Occupation: member Therapist, occupational    Comment: Forensic scientist  Social Needs  . Financial resource strain: Not on file  . Food insecurity    Worry: Not on file    Inability: Not on file  . Transportation needs    Medical: Not on file    Non-medical: Not on file  Tobacco Use  . Smoking status: Former Smoker    Packs/day: 0.50    Years: 15.00    Pack years: 7.50    Types: Cigarettes    Quit date: 04/20/2002    Years since quitting: 16.4  . Smokeless tobacco: Never Used  Substance and Sexual Activity  . Alcohol use: No  . Drug use: No  . Sexual activity: Not Currently    Birth control/protection: None  Lifestyle  . Physical activity    Days per week: Not on  file    Minutes per session: Not on file  . Stress: Not on file  Relationships  . Social Herbalist on phone: Not on file    Gets together: Not on file    Attends religious service: Not on file    Active member of club or organization: Not on file    Attends meetings of clubs or organizations: Not on file    Relationship status: Not on file  Other Topics Concern  . Not on file  Social History Narrative   No regular exercise.  Lives with her mother [name] and father Jasalyn Frysinger   Family History: Family History  Problem Relation Age of Onset  . Hypertension Other   . Diabetes Father    Allergies: No Known Allergies Medications: See med rec.  Review of Systems: No fevers, chills,  night sweats, weight loss, chest pain, or shortness of breath.   Objective:    General: Well Developed, well nourished, and in no acute distress.  Neuro: Alert and oriented x3, extra-ocular muscles intact, sensation grossly intact.  HEENT: Normocephalic, atraumatic, pupils equal round reactive to light, neck supple, no masses, no lymphadenopathy, thyroid nonpalpable.  Skin: Warm and dry, no rashes. Cardiac: Regular rate and rhythm, no murmurs rubs or gallops, no lower extremity edema.  Respiratory: Clear to auscultation bilaterally. Not using accessory muscles, speaking in full sentences. Left foot: No visible erythema or swelling. Range of motion is full in all directions. Strength is 5/5 in all directions. No hallux valgus. Pes cavus No abnormal callus noted. No pain over the navicular prominence, or base of fifth metatarsal. No tenderness to palpation of the calcaneal insertion of plantar fascia. No pain at the Achilles insertion. No pain over the calcaneal bursa. No pain of the retrocalcaneal bursa. No tenderness to palpation over the tarsals, metatarsals, or phalanges. No hallux rigidus or limitus. No tenderness palpation over interphalangeal joints. No pain with compression of the metatarsal heads. Neurovascularly intact distally.  Impression and Recommendations:    Plantar fasciitis of left foot Starting conservatively, avoid barefoot walking, shoes with a sufficient arch support, and rehab exercises given. Return as needed.  Left midfoot sprain Resolved with conservative treatment.   ___________________________________________ Gwen Her. Dianah Field, M.D., ABFM., CAQSM. Primary Care and Sports Medicine [facility]  Adjunct Professor of [employer] [employer]

## 2018-10-11 NOTE — Assessment & Plan Note (Signed)
Starting conservatively, avoid barefoot walking, shoes with a sufficient arch support, and rehab exercises given. Return as needed.

## 2018-10-11 NOTE — Assessment & Plan Note (Signed)
Resolved with conservative treatment. 

## 2018-11-01 ENCOUNTER — Ambulatory Visit (INDEPENDENT_AMBULATORY_CARE_PROVIDER_SITE_OTHER): Payer: BC Managed Care – PPO | Admitting: Obstetrics & Gynecology

## 2018-11-01 ENCOUNTER — Other Ambulatory Visit: Payer: Self-pay

## 2018-11-01 ENCOUNTER — Encounter: Payer: Self-pay | Admitting: Obstetrics & Gynecology

## 2018-11-01 VITALS — BP 136/85 | HR 100 | Temp 97.1°F | Resp 16 | Ht 66.0 in | Wt 222.0 lb

## 2018-11-01 DIAGNOSIS — Z01419 Encounter for gynecological examination (general) (routine) without abnormal findings: Secondary | ICD-10-CM | POA: Diagnosis not present

## 2018-11-01 DIAGNOSIS — Z1151 Encounter for screening for human papillomavirus (HPV): Secondary | ICD-10-CM | POA: Diagnosis not present

## 2018-11-01 DIAGNOSIS — Z124 Encounter for screening for malignant neoplasm of cervix: Secondary | ICD-10-CM

## 2018-11-01 NOTE — Progress Notes (Signed)
Subjective:    Elizabeth Copeland is a 48 y.o. female who presents for an annual exam. The patient has no complaints today, new sweating and having hot flashes. The patient is not currently sexually active. GYN screening history: last pap: was normal. The patient wears seatbelts: yes. The patient participates in regular exercise: no. Has the patient ever been transfused or tattooed?: yes. The patient reports that there is not domestic violence in her life.   Menstrual History: OB History    Gravida  1   Para  1   Term      Preterm      AB      Living  1     SAB      TAB      Ectopic      Multiple      Live Births              Menarche age: 48 yo Patient's last menstrual period was 10/17/2018.    The following portions of the patient's history were reviewed and updated as appropriate: allergies, current medications, past family history, past medical history, past social history, past surgical history and problem list.  Review of Systems Pertinent items are noted in HPI.   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 Mammogram UTD   Objective:    BP 136/85   Pulse 100   Temp (!) 97.1 F (36.2 C) (Oral)   Resp 16   Ht 5\' 6"  (1.676 m)   Wt 222 lb (100.7 kg)   LMP 10/17/2018   BMI 35.83 kg/m   General Appearance:    Alert, cooperative, no distress, appears stated age  Head:    Normocephalic, without obvious abnormality, atraumatic  Eyes:    PERRL, conjunctiva/corneas clear, EOM's intact, fundi    benign, both eyes  Ears:    Normal TM's and external ear canals, both ears  Nose:   Nares normal, septum midline, mucosa normal, no drainage    or sinus tenderness  Throat:   Lips, mucosa, and tongue normal; teeth and gums normal  Neck:   Supple, symmetrical, trachea midline, no adenopathy;    thyroid:  no  enlargement/tenderness/nodules; no carotid   bruit or JVD  Back:     Symmetric, no curvature, ROM normal, no CVA tenderness  Lungs:     Clear to auscultation bilaterally, respirations unlabored  Chest Wall:    No tenderness or deformity   Heart:    Regular rate and rhythm, S1 and S2 normal, no murmur, rub   or gallop  Breast Exam:    No tenderness, masses, or nipple abnormality  Abdomen:     Soft, non-tender, bowel sounds active all four quadrants,    no masses, no organomegaly  Genitalia:    Normal female without lesion, discharge or tenderness,  14 week uterus     Extremities:   Extremities normal, atraumatic, no cyanosis or edema  Pulses:   2+ and symmetric all extremities  Skin:   Skin color, texture, turgor normal, no rashes or lesions  Lymph nodes:   Cervical, supraclavicular, and axillary nodes normal  Neurologic:   CNII-XII intact, normal strength, sensation and reflexes    throughout  .    Assessment:    Healthy female exam.    Plan:     Thin prep Pap  smear. with cotesting

## 2018-11-03 DIAGNOSIS — F4322 Adjustment disorder with anxiety: Secondary | ICD-10-CM | POA: Diagnosis not present

## 2018-11-04 LAB — CYTOLOGY - PAP
Adequacy: ABSENT
Diagnosis: NEGATIVE
HPV: NOT DETECTED

## 2018-11-13 ENCOUNTER — Other Ambulatory Visit: Payer: Self-pay | Admitting: Sports Medicine

## 2018-11-13 DIAGNOSIS — M5416 Radiculopathy, lumbar region: Secondary | ICD-10-CM

## 2019-01-04 ENCOUNTER — Ambulatory Visit: Payer: BC Managed Care – PPO | Admitting: Obstetrics and Gynecology

## 2019-01-10 ENCOUNTER — Other Ambulatory Visit: Payer: Self-pay | Admitting: Sports Medicine

## 2019-01-10 DIAGNOSIS — M5416 Radiculopathy, lumbar region: Secondary | ICD-10-CM

## 2019-01-28 ENCOUNTER — Ambulatory Visit: Payer: BC Managed Care – PPO | Admitting: Family Medicine

## 2019-01-28 ENCOUNTER — Other Ambulatory Visit: Payer: Self-pay

## 2019-01-28 ENCOUNTER — Encounter: Payer: Self-pay | Admitting: Family Medicine

## 2019-01-28 VITALS — BP 126/74 | HR 77 | Ht 66.0 in | Wt 224.0 lb

## 2019-01-28 DIAGNOSIS — I10 Essential (primary) hypertension: Secondary | ICD-10-CM | POA: Diagnosis not present

## 2019-01-28 DIAGNOSIS — Z23 Encounter for immunization: Secondary | ICD-10-CM | POA: Diagnosis not present

## 2019-01-28 DIAGNOSIS — M7989 Other specified soft tissue disorders: Secondary | ICD-10-CM | POA: Diagnosis not present

## 2019-01-28 MED ORDER — LISINOPRIL-HYDROCHLOROTHIAZIDE 20-12.5 MG PO TABS: 1.0000 | ORAL_TABLET | Freq: Every day | ORAL | 2 refills | Status: DC

## 2019-01-28 NOTE — Progress Notes (Signed)
Acute Office Visit  Subjective:    Patient ID: Elizabeth Copeland, female    DOB: 02/25/1971, 48 y.o.   MRN: [id number]  Chief Complaint  Patient presents with  . Edema    under R arm x 1 month    HPI Patient is in today for right axillary swelling.. She says she noticed it about a month ago that there was a big difference there was a little more fullness in the right axilla.  She denies any pain or rash etc.  She has not full felt any swollen glands.  Her mammogram is up-to-date she had it done in December of last year.  No fevers chills or sweats.  Past Medical History:  Diagnosis Date  . Anemia   . Back pain   . Complication of anesthesia    patient has not received anesthesia before but states father and brother have a hard time waking up from anesthesia  . Family history of adverse reaction to anesthesia    mother "has a hard time waking up and moody"   . Fibroid   . Headache   . Morbid obesity ([place])   . Pneumonia     Past Surgical History:  Procedure Laterality Date  . BREAST BIOPSY Right    benign  . CESAREAN SECTION  2000  . DILATION AND CURETTAGE OF UTERUS N/A 09/01/2015   Procedure: DILATATION AND CURETTAGE;  Surgeon: Emily Filbert, MD;  Location: [facility];  Service: Gynecology;  Laterality: N/A;  . IR GENERIC HISTORICAL  11/18/2015   IR US GUIDE VASC ACCESS RIGHT 11/18/2015 [facility]  . IR GENERIC HISTORICAL  11/18/2015   IR ANGIOGRAM SELECTIVE EACH ADDITIONAL VESSEL 11/18/2015 [facility]  . IR GENERIC HISTORICAL  11/18/2015   IR EMBO TUMOR ORGAN ISCHEMIA INFARCT INC GUIDE ROADMAPPING 11/18/2015 [facility]  . IR GENERIC HISTORICAL  11/18/2015   IR ANGIOGRAM PELVIS SELECTIVE OR SUPRASELECTIVE 11/18/2015 [facility]  . IR GENERIC HISTORICAL  11/18/2015   IR ANGIOGRAM SELECTIVE EACH ADDITIONAL VESSEL 11/18/2015 [facility]  . IR GENERIC HISTORICAL  11/18/2015   IR ANGIOGRAM PELVIS SELECTIVE OR SUPRASELECTIVE 11/18/2015 [facility]  . IR GENERIC HISTORICAL   02/23/2016   IR RADIOLOGIST EVAL & MGMT 02/23/2016 Jacqulynn Cadet, MD [facility]  . IR GENERIC HISTORICAL  12/02/2015   IR RADIOLOGIST EVAL & MGMT 12/02/2015 Greggory Keen, MD [facility]  . IR RADIOLOGIST EVAL & MGMT  08/24/2016  . WISDOM TOOTH EXTRACTION      Family History  Problem Relation Age of Onset  . Hypertension Other   . Diabetes Father     Social History   Socioeconomic History  . Marital status: Single    Spouse name: Not on file  . Number of children: 1  . Years of education: 2 yr coll  . Highest education level: Not on file  Occupational History  . Occupation: member Therapist, occupational    Comment: Forensic scientist  Social Needs  . Financial resource strain: Not on file  . Food insecurity    Worry: Not on file    Inability: Not on file  . Transportation needs    Medical: Not on file    Non-medical: Not on file  Tobacco Use  . Smoking status: Former Smoker    Packs/day: 0.50    Years: 15.00    Pack years: 7.50    Types: Cigarettes    Quit date: 04/20/2002    Years since quitting: 16.7  .  Smokeless tobacco: Never Used  Substance and Sexual Activity  . Alcohol use: No  . Drug use: No  . Sexual activity: Not Currently    Birth control/protection: None  Lifestyle  . Physical activity    Days per week: Not on file    Minutes per session: Not on file  . Stress: Not on file  Relationships  . Social Herbalist on phone: Not on file    Gets together: Not on file    Attends religious service: Not on file    Active member of club or organization: Not on file    Attends meetings of clubs or organizations: Not on file    Relationship status: Not on file  . Intimate partner violence    Fear of current or ex partner: Not on file    Emotionally abused: Not on file    Physically abused: Not on file    Forced sexual activity: Not on file  Other Topics Concern  . Not on file  Social History Narrative   No regular exercise.   Lives with her mother [name] and father Chelsae Mcgloin    Outpatient Medications Prior to Visit  Medication Sig Dispense Refill  . gabapentin (NEURONTIN) 800 MG tablet TAKE 1 TABLET BY MOUTH IN THE MORNING, 1 TABLET IN THE MIDDAY, 2 TABLETS IN THE EVENING 120 tablet 3  . lisinopril-hydrochlorothiazide (ZESTORETIC) 20-12.5 MG tablet Take 1 tablet by mouth daily. 90 tablet 2  . Diclofenac Potassium (ZIPSOR) 25 MG CAPS Take 1 capsule (25 mg total) by mouth 4 (four) times daily as needed. (Patient not taking: Reported on 11/01/2018) 120 capsule 3  . Liraglutide -Weight Management (SAXENDA) 18 MG/3ML SOPN Inject 3 mg into the skin daily. 0.6 mg inj subcut daily for 1 week, then incr by 0.6 mg weekly until reaching 3 mg injected subcut daily 5 pen 0  . Milnacipran HCl (SAVELLA) 100 MG TABS tablet Take 1 tablet (100 mg total) by mouth 2 (two) times daily. 60 tablet 11   No facility-administered medications prior to visit.     No Known Allergies  ROS     Objective:    Physical Exam  Constitutional: She is oriented to person, place, and time. She appears well-developed and well-nourished.  HENT:  Head: Normocephalic and atraumatic.  Eyes: Conjunctivae and EOM are normal.  Cardiovascular: Normal rate.  Pulmonary/Chest: Effort normal.  Neurological: She is alert and oriented to person, place, and time.  Skin: Skin is dry. No pallor.  Right axillary fullness.  No discrete lymph nodes or abscess or skin lesions.  Psychiatric: She has a normal mood and affect. Her behavior is normal.  Vitals reviewed.   BP 126/74   Pulse 77   Ht 5\' 6"  (1.676 m)   Wt 224 lb (101.6 kg)   SpO2 100%   BMI 36.15 kg/m  Wt Readings from Last 3 Encounters:  01/28/19 224 lb (101.6 kg)  11/01/18 222 lb (100.7 kg)  10/11/18 226 lb (102.5 kg)    Health Maintenance Due  Topic Date Due  . HIV Screening  03/20/1984  . INFLUENZA VACCINE  11/03/2018    There are no preventive care reminders to display for this  patient.   Lab Results  Component Value Date   TSH 1.39 06/21/2017   Lab Results  Component Value Date   WBC 10.5 06/21/2017   HGB 13.1 06/21/2017   HCT 40.8 06/21/2017   MCV 74.7 (L) 06/21/2017   PLT 396  06/21/2017   Lab Results  Component Value Date   NA 136 06/21/2017   K 4.3 06/21/2017   CO2 27 06/21/2017   GLUCOSE 93 06/21/2017   BUN 11 06/21/2017   CREATININE 0.91 06/21/2017   BILITOT 0.6 06/21/2017   ALKPHOS 62 08/31/2016   AST 21 06/21/2017   ALT 18 06/21/2017   PROT 7.4 06/21/2017   ALBUMIN 4.2 08/31/2016   CALCIUM 9.8 06/21/2017   ANIONGAP 7 11/18/2015   Lab Results  Component Value Date   CHOL 246 (H) 06/21/2017   Lab Results  Component Value Date   HDL 39 (L) 06/21/2017   Lab Results  Component Value Date   LDLCALC 170 (H) 06/21/2017   Lab Results  Component Value Date   TRIG 207 (H) 06/21/2017   Lab Results  Component Value Date   CHOLHDL 6.3 (H) 06/21/2017   Lab Results  Component Value Date   HGBA1C 5.5 12/14/2016       Assessment & Plan:   Problem List Items Addressed This Visit      Cardiovascular and Mediastinum   Essential hypertension, benign   Relevant Medications   lisinopril-hydrochlorothiazide (ZESTORETIC) 20-12.5 MG tablet    Other Visit Diagnoses    Right axillary swelling    -  Primary   Relevant Orders   CBC with Differential/Platelet   [place] AXILLA RIGHT   Need for immunization against influenza       Relevant Orders   Flu Vaccine QUAD 36+ mos IM (Completed)    Right axillary swelling-suspect just fatty tissue difference between the right and the left side but will get ultrasound and CBC just to make sure everything is normal.   We will try to schedule on Wednesday if possible.   Meds ordered this encounter  Medications  . lisinopril-hydrochlorothiazide (ZESTORETIC) 20-12.5 MG tablet    Sig: Take 1 tablet by mouth daily.    Dispense:  90 tablet    Refill:  2    Ok to fill on 02/17/2019.     Beatrice Lecher, MD

## 2019-01-30 ENCOUNTER — Other Ambulatory Visit: Payer: BC Managed Care – PPO

## 2019-01-30 ENCOUNTER — Ambulatory Visit: Payer: BC Managed Care – PPO | Admitting: Obstetrics & Gynecology

## 2019-02-14 ENCOUNTER — Encounter: Payer: Self-pay | Admitting: Sports Medicine

## 2019-02-14 ENCOUNTER — Ambulatory Visit (INDEPENDENT_AMBULATORY_CARE_PROVIDER_SITE_OTHER): Payer: BC Managed Care – PPO | Admitting: Sports Medicine

## 2019-02-14 ENCOUNTER — Other Ambulatory Visit: Payer: Self-pay

## 2019-02-14 DIAGNOSIS — M797 Fibromyalgia: Secondary | ICD-10-CM | POA: Diagnosis not present

## 2019-02-14 NOTE — Progress Notes (Signed)
Subjective:    CC: Follow-up  HPI: Fibromyalgia/myofascial pain syndrome: Doing well with gabapentin alone, off of Savella now  I reviewed the past medical history, family history, social history, surgical history, and allergies today and no changes were needed.  Please see the problem list section below in epic for further details.  Past Medical History: Past Medical History:  Diagnosis Date  . Anemia   . Back pain   . Complication of anesthesia    patient has not received anesthesia before but states father and brother have a hard time waking up from anesthesia  . Family history of adverse reaction to anesthesia    mother "has a hard time waking up and moody"   . Fibroid   . Headache   . Morbid obesity ([place])   . Pneumonia    Past Surgical History: Past Surgical History:  Procedure Laterality Date  . BREAST BIOPSY Right    benign  . CESAREAN SECTION  2000  . DILATION AND CURETTAGE OF UTERUS N/A 09/01/2015   Procedure: DILATATION AND CURETTAGE;  Surgeon: Emily Filbert, MD;  Location: [facility];  Service: Gynecology;  Laterality: N/A;  . IR GENERIC HISTORICAL  11/18/2015   IR US GUIDE VASC ACCESS RIGHT 11/18/2015 [facility]  . IR GENERIC HISTORICAL  11/18/2015   IR ANGIOGRAM SELECTIVE EACH ADDITIONAL VESSEL 11/18/2015 [facility]  . IR GENERIC HISTORICAL  11/18/2015   IR EMBO TUMOR ORGAN ISCHEMIA INFARCT INC GUIDE ROADMAPPING 11/18/2015 [facility]  . IR GENERIC HISTORICAL  11/18/2015   IR ANGIOGRAM PELVIS SELECTIVE OR SUPRASELECTIVE 11/18/2015 [facility]  . IR GENERIC HISTORICAL  11/18/2015   IR ANGIOGRAM SELECTIVE EACH ADDITIONAL VESSEL 11/18/2015 [facility]  . IR GENERIC HISTORICAL  11/18/2015   IR ANGIOGRAM PELVIS SELECTIVE OR SUPRASELECTIVE 11/18/2015 [facility]  . IR GENERIC HISTORICAL  02/23/2016   IR RADIOLOGIST EVAL & MGMT 02/23/2016 Jacqulynn Cadet, MD [facility]  . IR GENERIC HISTORICAL  12/02/2015   IR RADIOLOGIST EVAL & MGMT 12/02/2015 Greggory Keen, MD  [facility]  . IR RADIOLOGIST EVAL & MGMT  08/24/2016  . WISDOM TOOTH EXTRACTION     Social History: Social History   Socioeconomic History  . Marital status: Single    Spouse name: Not on file  . Number of children: 1  . Years of education: 2 yr coll  . Highest education level: Not on file  Occupational History  . Occupation: member Therapist, occupational    Comment: Forensic scientist  Social Needs  . Financial resource strain: Not on file  . Food insecurity    Worry: Not on file    Inability: Not on file  . Transportation needs    Medical: Not on file    Non-medical: Not on file  Tobacco Use  . Smoking status: Former Smoker    Packs/day: 0.50    Years: 15.00    Pack years: 7.50    Types: Cigarettes    Quit date: 04/20/2002    Years since quitting: 16.8  . Smokeless tobacco: Never Used  Substance and Sexual Activity  . Alcohol use: No  . Drug use: No  . Sexual activity: Not Currently    Birth control/protection: None  Lifestyle  . Physical activity    Days per week: Not on file    Minutes per session: Not on file  . Stress: Not on file  Relationships  . Social connections    Talks on phone: Not on file  Gets together: Not on file    Attends religious service: Not on file    Active member of club or organization: Not on file    Attends meetings of clubs or organizations: Not on file    Relationship status: Not on file  Other Topics Concern  . Not on file  Social History Narrative   No regular exercise.  Lives with her mother [name] and father Royale Alfred   Family History: Family History  Problem Relation Age of Onset  . Hypertension Other   . Diabetes Father    Allergies: No Known Allergies Medications: See med rec.  Review of Systems: No fevers, chills, night sweats, weight loss, chest pain, or shortness of breath.   Objective:    General: Well Developed, well nourished, and in no acute distress.  Neuro: Alert and oriented x3,  extra-ocular muscles intact, sensation grossly intact.  HEENT: Normocephalic, atraumatic, pupils equal round reactive to light, neck supple, no masses, no lymphadenopathy, thyroid nonpalpable.  Skin: Warm and dry, no rashes. Cardiac: Regular rate and rhythm, no murmurs rubs or gallops, no lower extremity edema.  Respiratory: Clear to auscultation bilaterally. Not using accessory muscles, speaking in full sentences.  Impression and Recommendations:    Fibromyalgia 6 months ago things were well controlled with Savella twice a day and gabapentin, she started to miss doses and then stopped and noted that her widespread aches and pains improved considerably. I think it is okay to keep her off of Savella, continue gabapentin, return to see me as needed.   ___________________________________________ Gwen Her. Dianah Field, M.D., ABFM., CAQSM. Primary Care and Sports Medicine  [facility]  Adjunct Professor of [employer] [facility] [employer]

## 2019-02-14 NOTE — Assessment & Plan Note (Signed)
6 months ago things were well controlled with Savella twice a day and gabapentin, she started to miss doses and then stopped and noted that her widespread aches and pains improved considerably. I think it is okay to keep her off of Savella, continue gabapentin, return to see me as needed.

## 2019-02-21 ENCOUNTER — Other Ambulatory Visit: Payer: BC Managed Care – PPO

## 2019-03-12 DIAGNOSIS — Z03818 Encounter for observation for suspected exposure to other biological agents ruled out: Secondary | ICD-10-CM | POA: Diagnosis not present

## 2019-03-12 DIAGNOSIS — J069 Acute upper respiratory infection, unspecified: Secondary | ICD-10-CM | POA: Diagnosis not present

## 2019-03-27 ENCOUNTER — Encounter: Payer: Self-pay | Admitting: *Deleted

## 2019-03-27 ENCOUNTER — Telehealth: Payer: Self-pay

## 2019-03-27 ENCOUNTER — Other Ambulatory Visit: Payer: Self-pay

## 2019-03-27 ENCOUNTER — Emergency Department (INDEPENDENT_AMBULATORY_CARE_PROVIDER_SITE_OTHER)
Admission: EM | Admit: 2019-03-27 | Discharge: 2019-03-27 | Disposition: A | Discharge status: Home / Self Care | Payer: BC Managed Care – PPO | Source: Home / Self Care | Attending: Family Medicine | Admitting: Family Medicine

## 2019-03-27 DIAGNOSIS — R1011 Right upper quadrant pain: Secondary | ICD-10-CM | POA: Diagnosis not present

## 2019-03-27 DIAGNOSIS — K219 Gastro-esophageal reflux disease without esophagitis: Secondary | ICD-10-CM

## 2019-03-27 MED ORDER — OMEPRAZOLE 40 MG PO CPDR: DELAYED_RELEASE_CAPSULE | ORAL | 1 refills | Status: DC

## 2019-03-27 NOTE — Telephone Encounter (Signed)
Elizabeth Copeland reports right side chest pain for 2 days. Denies shortness of breath, headaches, nausea, vomiting or dizziness. She is having some diarrhea. Denies loss of test or smell or respiratory symptoms. Advised to go to the urgent care. We have no openings and we are closed for the next 4 days.

## 2019-03-27 NOTE — ED Triage Notes (Addendum)
Pt c/o RT sided CP x 1 day that goes under her RT breast. She reports increased stress recently. She notes the pain was worse after eating a piece of pie. Also, reports diarrhea and burping.

## 2019-03-27 NOTE — Discharge Instructions (Addendum)
If symptoms become significantly worse during the night or over the weekend, proceed to the local emergency room.

## 2019-03-27 NOTE — ED Provider Notes (Signed)
[facility]    CSN: [id number] Arrival date & time: 03/27/19  1503      History   Chief Complaint Chief Complaint  Patient presents with  . Chest Pain    HPI Elizabeth Copeland is a 48 y.o. female.   At 5pm yesterday patient developed sharp pain in her right chest.  The pain became significantly worse after eating a piece of sweet potato pie.  The pain has persisted, and somewhat improved with Rolaids.  She denies nausea/vomiting, but had an episode of loose stools yesterday (she believes that she has IBS).  Her pain improves somewhat after drinking water.  She has had frequent "burping."  She admits to increased stress recently. She has a family history of gall bladder disease (daughter and mother).  The history is provided by the patient.  Abdominal Pain Pain quality: sharp   Pain radiation: beneath right breast. Onset quality:  Sudden Duration:  1 day Timing:  Intermittent Progression:  Unchanged Chronicity:  New Context: eating   Relieved by:  Nothing Worsened by:  Eating Ineffective treatments: Tums. Associated symptoms: belching, chest pain and diarrhea   Associated symptoms: no anorexia, no chills, no constipation, no cough, no dysuria, no fatigue, no fever, no flatus, no hematemesis, no hematochezia, no hematuria, no melena, no nausea, no shortness of breath, no sore throat and no vomiting   Risk factors: obesity     Past Medical History:  Diagnosis Date  . Anemia   . Back pain   . Complication of anesthesia    patient has not received anesthesia before but states father and brother have a hard time waking up from anesthesia  . Family history of adverse reaction to anesthesia    mother "has a hard time waking up and moody"   . Fibroid   . Headache   . Morbid obesity ([place])   . Pneumonia     Patient Active Problem List   Diagnosis Date Noted  . Left midfoot sprain 09/27/2018  . Metabolic syndrome [id number]  . Fibromyalgia 12/02/2017  .  Plantar fasciitis of left foot 06/28/2017  . Trochanteric bursitis of right hip 06/28/2017  . Fatty liver 09/02/2016  . Morbid obesity ([place]) 07/21/2015  . Intramural leiomyoma of uterus 06/03/2015  . Breast mass, right 06/03/2015  . Vitamin D deficiency 06/03/2015  . Anemia, iron deficiency 06/03/2015  . Microcytic anemia 05/07/2015  . Essential hypertension, benign 05/06/2015  . Ulnar neuropathy of right upper extremity 02/19/2014  . Uterine fibroid with menorrhagia and anemia 12/25/2012  . Right lumbar radiculitis 12/19/2012  . Back pain 02/17/2011  . BMI 40.0-44.9, adult ([place]) 02/17/2011    Past Surgical History:  Procedure Laterality Date  . BREAST BIOPSY Right    benign  . CESAREAN SECTION  2000  . DILATION AND CURETTAGE OF UTERUS N/A 09/01/2015   Procedure: DILATATION AND CURETTAGE;  Surgeon: Emily Filbert, MD;  Location: [facility];  Service: Gynecology;  Laterality: N/A;  . IR GENERIC HISTORICAL  11/18/2015   IR US GUIDE VASC ACCESS RIGHT 11/18/2015 [facility]  . IR GENERIC HISTORICAL  11/18/2015   IR ANGIOGRAM SELECTIVE EACH ADDITIONAL VESSEL 11/18/2015 [facility]  . IR GENERIC HISTORICAL  11/18/2015   IR EMBO TUMOR ORGAN ISCHEMIA INFARCT INC GUIDE ROADMAPPING 11/18/2015 [facility]  . IR GENERIC HISTORICAL  11/18/2015   IR ANGIOGRAM PELVIS SELECTIVE OR SUPRASELECTIVE 11/18/2015 [facility]  . IR GENERIC HISTORICAL  11/18/2015   IR ANGIOGRAM SELECTIVE EACH ADDITIONAL VESSEL 11/18/2015 [facility]  . IR GENERIC HISTORICAL  11/18/2015   IR ANGIOGRAM PELVIS SELECTIVE OR SUPRASELECTIVE 11/18/2015 [facility]  . IR GENERIC HISTORICAL  02/23/2016   IR RADIOLOGIST EVAL & MGMT 02/23/2016 Jacqulynn Cadet, MD [facility]  . IR GENERIC HISTORICAL  12/02/2015   IR RADIOLOGIST EVAL & MGMT 12/02/2015 Greggory Keen, MD [facility]  . IR RADIOLOGIST EVAL & MGMT  08/24/2016  . WISDOM TOOTH EXTRACTION      OB History    Gravida  1   Para  1   Term      Preterm      AB       Living  1     SAB      TAB      Ectopic      Multiple      Live Births               Home Medications    Prior to Admission medications   Medication Sig Start Date End Date Taking? Authorizing Provider  gabapentin (NEURONTIN) 800 MG tablet TAKE 1 TABLET BY MOUTH IN THE MORNING, 1 TABLET IN THE MIDDAY, 2 TABLETS IN THE EVENING 01/10/19   Silverio Decamp, MD  lisinopril-hydrochlorothiazide (ZESTORETIC) 20-12.5 MG tablet Take 1 tablet by mouth daily. 01/28/19   Hali Marry, MD  omeprazole (PRILOSEC) 40 MG capsule Take one cap PO once daily 30 minutes AC 03/27/19   Kandra Nicolas, MD    Family History Family History  Problem Relation Age of Onset  . Hypertension Other   . Diabetes Father     Social History Social History   Tobacco Use  . Smoking status: Former Smoker    Packs/day: 0.50    Years: 15.00    Pack years: 7.50    Types: Cigarettes    Quit date: 04/20/2002    Years since quitting: 16.9  . Smokeless tobacco: Never Used  Substance Use Topics  . Alcohol use: No  . Drug use: No     Allergies   Patient has no known allergies.   Review of Systems Review of Systems  Constitutional: Negative for activity change, appetite change, chills, diaphoresis, fatigue and fever.  HENT: Negative for sore throat.   Respiratory: Negative for cough and shortness of breath.   Cardiovascular: Positive for chest pain.  Gastrointestinal: Positive for abdominal pain and diarrhea. Negative for abdominal distention, anorexia, constipation, flatus, hematemesis, hematochezia, melena, nausea and vomiting.  Genitourinary: Negative for dysuria and hematuria.  All other systems reviewed and are negative.    Physical Exam Triage Vital Signs ED Triage Vitals  Enc Vitals Group     BP 03/27/19 1520 (!) 151/90     Pulse Rate 03/27/19 1520 89     Resp 03/27/19 1520 18     Temp 03/27/19 1520 98.4 F (36.9 C)     Temp Source 03/27/19 1520 Oral     SpO2  03/27/19 1520 97 %     Weight 03/27/19 1522 220 lb (99.8 kg)     Height 03/27/19 1522 5\' 3"  (1.6 m)     Head Circumference --      Peak Flow --      Pain Score 03/27/19 1522 5     Pain Loc --      Pain Edu? --      Excl. in [place]? --    No data found.  Updated Vital Signs BP (!) 151/90 (BP Location: Left Arm)  Pulse 89   Temp 98.4 F (36.9 C) (Oral)   Resp 18   Ht 5\' 3"  (1.6 m)   Wt 99.8 kg   LMP 03/18/2019   SpO2 97%   BMI 38.97 kg/m   Visual Acuity Right Eye Distance:   Left Eye Distance:   Bilateral Distance:    Right Eye Near:   Left Eye Near:    Bilateral Near:     Physical Exam Vitals and nursing note reviewed.  Constitutional:      General: She is not in acute distress.    Appearance: She is obese.  HENT:     Head: Normocephalic.     Nose: Nose normal.     Mouth/Throat:     Pharynx: Oropharynx is clear.  Eyes:     Conjunctiva/sclera: Conjunctivae normal.     Pupils: Pupils are equal, round, and reactive to light.  Cardiovascular:     Heart sounds: Normal heart sounds.  Pulmonary:     Breath sounds: Normal breath sounds.  Abdominal:     General: Abdomen is protuberant. Bowel sounds are normal.     Palpations: There is no hepatomegaly or splenomegaly.     Tenderness: There is abdominal tenderness in the right upper quadrant and epigastric area. Positive signs include Murphy's sign.    Musculoskeletal:     Right lower leg: No edema.     Left lower leg: No edema.  Lymphadenopathy:     Cervical: No cervical adenopathy.  Skin:    General: Skin is warm and dry.     Findings: No rash.  Neurological:     Mental Status: She is alert.      UC Treatments / Results  Labs (all labs ordered are listed, but only abnormal results are displayed) Labs Reviewed  CBC WITH DIFFERENTIAL/PLATELET  COMPLETE METABOLIC PANEL WITH GFR  AMYLASE  LIPASE    EKG  Rate:  87 BPM PR:  144 msec QT:  342 msec QTcH:  411 msec QRSD:  84 msec QRS axis:  58  degrees Interpretation:   No acute changes; normal sinus rhythm   Radiology No results found.  Procedures Procedures (including critical care time)  Medications Ordered in UC Medications - No data to display  Initial Impression / Assessment and Plan / UC Course  I have reviewed the triage vital signs and the nursing notes.  Pertinent labs & imaging results that were available during my care of the patient were reviewed by me and considered in my medical decision making (see chart for details).    No acute changes on EKG Suspect biliary colic.  Schedule GB US.  CMP, amylase, lipase, CBC pending. Begin empiric omeprazole 40mg  daily. Followup with Family Doctor in one week.   Final Clinical Impressions(s) / UC Diagnoses   Final diagnoses:  Abdominal pain, right upper quadrant  Gastroesophageal reflux disease, unspecified whether esophagitis present     Discharge Instructions     If symptoms become significantly worse during the night or over the weekend, proceed to the local emergency room.    ED Prescriptions    Medication Sig Dispense Auth. Provider   omeprazole (PRILOSEC) 40 MG capsule Take one cap PO once daily 30 minutes AC 15 capsule Kandra Nicolas, MD        Kandra Nicolas, MD 04/04/19 1130

## 2019-03-28 LAB — COMPLETE METABOLIC PANEL WITH GFR
AG Ratio: 1.8 (calc) (ref 1.0–2.5)
ALT: 10 U/L (ref 6–29)
AST: 13 U/L (ref 10–35)
Albumin: 4.4 g/dL (ref 3.6–5.1)
Alkaline phosphatase (APISO): 59 U/L (ref 37–153)
BUN: 11 mg/dL (ref 7–25)
CO2: 27 mmol/L (ref 20–32)
Calcium: 10.5 mg/dL — ABNORMAL HIGH (ref 8.6–10.4)
Chloride: 100 mmol/L (ref 98–110)
Creat: 0.77 mg/dL (ref 0.50–1.05)
GFR, Est African American: 104 mL/min/{1.73_m2} (ref >=60)
GFR, Est Non African American: 90 mL/min/{1.73_m2} (ref >=60)
Globulin: 2.5 g/dL (calc) (ref 1.9–3.7)
Glucose, Bld: 84 mg/dL (ref 65–99)
Potassium: 3.9 mmol/L (ref 3.5–5.3)
Sodium: 137 mmol/L (ref 135–146)
Total Bilirubin: 0.5 mg/dL (ref 0.2–1.2)
Total Protein: 6.9 g/dL (ref 6.1–8.1)

## 2019-03-28 LAB — CBC WITH DIFFERENTIAL/PLATELET
Absolute Monocytes: 627 cells/uL (ref 200–950)
Basophils Absolute: 55 cells/uL (ref 0–200)
Basophils Relative: 0.5 %
Eosinophils Absolute: 121 cells/uL (ref 15–500)
Eosinophils Relative: 1.1 %
HCT: 41 % (ref 35.0–45.0)
Hemoglobin: 13.6 g/dL (ref 11.7–15.5)
Lymphs Abs: 2805 cells/uL (ref 850–3900)
MCH: 27.4 pg (ref 27.0–33.0)
MCHC: 33.2 g/dL (ref 32.0–36.0)
MCV: 82.5 fL (ref 80.0–100.0)
MPV: 9.9 fL (ref 7.5–12.5)
Monocytes Relative: 5.7 %
Neutro Abs: 7392 cells/uL (ref 1500–7800)
Neutrophils Relative %: 67.2 %
Platelets: 414 10*3/uL — ABNORMAL HIGH (ref 140–400)
RBC: 4.97 10*6/uL (ref 3.80–5.10)
RDW: 13.3 % (ref 11.0–15.0)
Total Lymphocyte: 25.5 %
WBC: 11 10*3/uL — ABNORMAL HIGH (ref 3.8–10.8)

## 2019-03-28 LAB — LIPASE: Lipase: 32 U/L (ref 7–60)

## 2019-03-28 LAB — AMYLASE: Amylase: 41 U/L (ref 21–101)

## 2019-04-01 ENCOUNTER — Other Ambulatory Visit: Payer: Self-pay

## 2019-04-01 ENCOUNTER — Ambulatory Visit (INDEPENDENT_AMBULATORY_CARE_PROVIDER_SITE_OTHER): Payer: BC Managed Care – PPO

## 2019-04-01 DIAGNOSIS — K760 Fatty (change of) liver, not elsewhere classified: Secondary | ICD-10-CM | POA: Diagnosis not present

## 2019-05-14 ENCOUNTER — Other Ambulatory Visit: Payer: Self-pay | Admitting: Sports Medicine

## 2019-05-14 DIAGNOSIS — M5416 Radiculopathy, lumbar region: Secondary | ICD-10-CM

## 2019-05-31 ENCOUNTER — Other Ambulatory Visit: Payer: Self-pay | Admitting: Family Medicine

## 2019-05-31 DIAGNOSIS — I10 Essential (primary) hypertension: Secondary | ICD-10-CM

## 2019-06-25 ENCOUNTER — Other Ambulatory Visit: Payer: Self-pay | Admitting: Obstetrics & Gynecology

## 2019-06-25 DIAGNOSIS — Z1231 Encounter for screening mammogram for malignant neoplasm of breast: Secondary | ICD-10-CM

## 2019-06-26 ENCOUNTER — Ambulatory Visit (INDEPENDENT_AMBULATORY_CARE_PROVIDER_SITE_OTHER): Payer: BC Managed Care – PPO | Admitting: Family Medicine

## 2019-06-26 ENCOUNTER — Other Ambulatory Visit: Payer: Self-pay

## 2019-06-26 ENCOUNTER — Encounter: Payer: Self-pay | Admitting: Family Medicine

## 2019-06-26 VITALS — BP 124/76 | HR 88 | Ht 66.0 in | Wt 224.0 lb

## 2019-06-26 DIAGNOSIS — Z23 Encounter for immunization: Secondary | ICD-10-CM | POA: Diagnosis not present

## 2019-06-26 DIAGNOSIS — Z0000 Encounter for general adult medical examination without abnormal findings: Secondary | ICD-10-CM

## 2019-06-26 DIAGNOSIS — M797 Fibromyalgia: Secondary | ICD-10-CM

## 2019-06-26 LAB — LIPID PANEL
Cholesterol: 239 mg/dL — ABNORMAL HIGH (ref <200)
HDL: 46 mg/dL — ABNORMAL LOW (ref >=50)
LDL Cholesterol (Calc): 158 mg/dL (calc) — ABNORMAL HIGH
Non-HDL Cholesterol (Calc): 193 mg/dL (calc) — ABNORMAL HIGH (ref <130)
Total CHOL/HDL Ratio: 5.2 (calc) — ABNORMAL HIGH (ref <5.0)
Triglycerides: 193 mg/dL — ABNORMAL HIGH (ref <150)

## 2019-06-26 LAB — TSH: TSH: 1.89 mIU/L

## 2019-06-26 MED ORDER — SAVELLA 100 MG PO TABS: 100.0000 mg | ORAL_TABLET | Freq: Two times a day (BID) | ORAL | 5 refills | Status: DC

## 2019-06-26 NOTE — Patient Instructions (Signed)
Health Maintenance, Female Adopting a healthy lifestyle and getting preventive care are important in promoting health and wellness. Ask your health care provider about:  The right schedule for you to have regular tests and exams.  Things you can do on your own to prevent diseases and keep yourself healthy. What should I know about diet, weight, and exercise? Eat a healthy diet   Eat a diet that includes plenty of vegetables, fruits, low-fat dairy products, and lean protein.  Do not eat a lot of foods that are high in solid fats, added sugars, or sodium. Maintain a healthy weight Body mass index (BMI) is used to identify weight problems. It estimates body fat based on height and weight. Your health care provider can help determine your BMI and help you achieve or maintain a healthy weight. Get regular exercise Get regular exercise. This is one of the most important things you can do for your health. Most adults should:  Exercise for at least 150 minutes each week. The exercise should increase your heart rate and make you sweat (moderate-intensity exercise).  Do strengthening exercises at least twice a week. This is in addition to the moderate-intensity exercise.  Spend less time sitting. Even light physical activity can be beneficial. Watch cholesterol and blood lipids Have your blood tested for lipids and cholesterol at 51 years of age, then have this test every 5 years. Have your cholesterol levels checked more often if:  Your lipid or cholesterol levels are high.  You are older than 51 years of age.  You are at high risk for heart disease. What should I know about cancer screening? Depending on your health history and family history, you may need to have cancer screening at various ages. This may include screening for:  Breast cancer.  Cervical cancer.  Colorectal cancer.  Skin cancer.  Lung cancer. What should I know about heart disease, diabetes, and high blood  pressure? Blood pressure and heart disease  High blood pressure causes heart disease and increases the risk of stroke. This is more likely to develop in people who have high blood pressure readings, are of African descent, or are overweight.  Have your blood pressure checked: ? Every 3-5 years if you are 18-39 years of age. ? Every year if you are 40 years old or older. Diabetes Have regular diabetes screenings. This checks your fasting blood sugar level. Have the screening done:  Once every three years after age 40 if you are at a normal weight and have a low risk for diabetes.  More often and at a younger age if you are overweight or have a high risk for diabetes. What should I know about preventing infection? Hepatitis B If you have a higher risk for hepatitis B, you should be screened for this virus. Talk with your health care provider to find out if you are at risk for hepatitis B infection. Hepatitis C Testing is recommended for:  Everyone born from 1945 through 1965.  Anyone with known risk factors for hepatitis C. Sexually transmitted infections (STIs)  Get screened for STIs, including gonorrhea and chlamydia, if: ? You are sexually active and are younger than 51 years of age. ? You are older than 51 years of age and your health care provider tells you that you are at risk for this type of infection. ? Your sexual activity has changed since you were last screened, and you are at increased risk for chlamydia or gonorrhea. Ask your health care provider if   you are at risk.  Ask your health care provider about whether you are at high risk for HIV. Your health care provider may recommend a prescription medicine to help prevent HIV infection. If you choose to take medicine to prevent HIV, you should first get tested for HIV. You should then be tested every 3 months for as long as you are taking the medicine. Pregnancy  If you are about to stop having your period (premenopausal) and  you may become pregnant, seek counseling before you get pregnant.  Take 400 to 800 micrograms (mcg) of folic acid every day if you become pregnant.  Ask for birth control (contraception) if you want to prevent pregnancy. Osteoporosis and menopause Osteoporosis is a disease in which the bones lose minerals and strength with aging. This can result in bone fractures. If you are 65 years old or older, or if you are at risk for osteoporosis and fractures, ask your health care provider if you should:  Be screened for bone loss.  Take a calcium or vitamin D supplement to lower your risk of fractures.  Be given hormone replacement therapy (HRT) to treat symptoms of menopause. Follow these instructions at home: Lifestyle  Do not use any products that contain nicotine or tobacco, such as cigarettes, e-cigarettes, and chewing tobacco. If you need help quitting, ask your health care provider.  Do not use street drugs.  Do not share needles.  Ask your health care provider for help if you need support or information about quitting drugs. Alcohol use  Do not drink alcohol if: ? Your health care provider tells you not to drink. ? You are pregnant, may be pregnant, or are planning to become pregnant.  If you drink alcohol: ? Limit how much you use to 0-1 drink a day. ? Limit intake if you are breastfeeding.  Be aware of how much alcohol is in your drink. In the [place] one drink equals one 12 oz bottle of beer (355 mL), one 5 oz glass of wine (148 mL), or one 1 oz glass of hard liquor (44 mL). General instructions  Schedule regular health, dental, and eye exams.  Stay current with your vaccines.  Tell your health care provider if: ? You often feel depressed. ? You have ever been abused or do not feel safe at home. Summary  Adopting a healthy lifestyle and getting preventive care are important in promoting health and wellness.  Follow your health care provider's instructions about healthy  diet, exercising, and getting tested or screened for diseases.  Follow your health care provider's instructions on monitoring your cholesterol and blood pressure. This information is not intended to replace advice given to you by your health care provider. Make sure you discuss any questions you have with your health care provider. Document Revised: 03/14/2018 Document Reviewed: 03/14/2018 Elsevier Patient Education  2020 Elsevier Inc.  

## 2019-06-26 NOTE — Progress Notes (Signed)
Subjective:     Elizabeth Copeland is a 49 y.o. female and is here for a comprehensive physical exam. The patient reports no problems.  She is doing well overall.  Recently engaged to [place].  Social History   Socioeconomic History  . Marital status: Single    Spouse name: Not on file  . Number of children: 1  . Years of education: 2 yr coll  . Highest education level: Not on file  Occupational History  . Occupation: member Therapist, occupational    Comment: Forensic scientist  Tobacco Use  . Smoking status: Former Smoker    Packs/day: 0.50    Years: 15.00    Pack years: 7.50    Types: Cigarettes    Quit date: 04/20/2002    Years since quitting: 17.1  . Smokeless tobacco: Never Used  Substance and Sexual Activity  . Alcohol use: No  . Drug use: No  . Sexual activity: Not Currently    Birth control/protection: None  Other Topics Concern  . Not on file  Social History Narrative   No regular exercise.  Lives with her mother [name] and father Nivaeh Hofland   Social Determinants of Health   Financial Resource Strain:   . Difficulty of Paying Living Expenses:   Food Insecurity:   . Worried About Charity fundraiser in the Last Year:   . Arboriculturist in the Last Year:   Transportation Needs:   . Film/video editor (Medical):   Marland Kitchen Lack of Transportation (Non-Medical):   Physical Activity:   . Days of Exercise per Week:   . Minutes of Exercise per Session:   Stress:   . Feeling of Stress :   Social Connections:   . Frequency of Communication with Friends and Family:   . Frequency of Social Gatherings with Friends and Family:   . Attends Religious Services:   . Active Member of Clubs or Organizations:   . Attends Archivist Meetings:   Marland Kitchen Marital Status:   Intimate Partner Violence:   . Fear of Current or Ex-Partner:   . Emotionally Abused:   Marland Kitchen Physically Abused:   . Sexually Abused:    Health Maintenance  Topic Date Due  . HIV Screening  Never  done  . COLONOSCOPY  Never done  . MAMMOGRAM  03/22/2020  . PAP SMEAR-Modifier  10/31/2021  . TETANUS/TDAP  02/21/2023  . INFLUENZA VACCINE  Completed    The following portions of the patient's history were reviewed and updated as appropriate: allergies, current medications, past family history, past medical history, past social history, past surgical history and problem list.  Review of Systems A comprehensive review of systems was negative.   Objective:    BP 124/76   Pulse 88   Ht 5\' 6"  (1.676 m)   Wt 224 lb (101.6 kg)   SpO2 97%   BMI 36.15 kg/m  General appearance: alert, cooperative and appears stated age Head: Normocephalic, without obvious abnormality, atraumatic Eyes: conj clear, EOMI ,PEERLA Ears: normal TM's and external ear canals both ears Nose: Nares normal. Septum midline. Mucosa normal. No drainage or sinus tenderness. Throat: lips, mucosa, and tongue normal; teeth and gums normal Neck: no adenopathy, no carotid bruit, no JVD, supple, symmetrical, trachea midline and thyroid not enlarged, symmetric, no tenderness/mass/nodules Back: symmetric, no curvature. ROM normal. No CVA tenderness. Lungs: clear to auscultation bilaterally Breasts: normal appearance, no masses or tenderness Heart: regular rate and rhythm, S1, S2 normal, no murmur,  click, rub or gallop Abdomen: soft, non-tender; bowel sounds normal; no masses,  no organomegaly Extremities: extremities normal, atraumatic, no cyanosis or edema Pulses: 2+ and symmetric Skin: Skin color, texture, turgor normal. No rashes or lesions Lymph nodes: Cervical, supraclavicular, and axillary nodes normal. Neurologic: Alert and oriented X 3, normal strength and tone. Normal symmetric reflexes. Normal coordination and gait    Assessment:    Healthy female exam.     Plan:     See After Visit Summary for Counseling Recommendations   Keep up a regular exercise program and make sure you are eating a healthy diet Try  to eat 4 servings of dairy a day, or if you are lactose intolerant take a calcium with vitamin D daily.  Your vaccines are up to date.  Discuss shingles vaccine today first 1 given.  She had recent blood work done in December so just due for lipid panel and TSH.

## 2019-07-02 ENCOUNTER — Other Ambulatory Visit: Payer: Self-pay

## 2019-07-02 ENCOUNTER — Encounter: Payer: Self-pay | Admitting: *Deleted

## 2019-07-02 ENCOUNTER — Emergency Department (INDEPENDENT_AMBULATORY_CARE_PROVIDER_SITE_OTHER)
Admission: EM | Admit: 2019-07-02 | Discharge: 2019-07-02 | Disposition: A | Discharge status: Home / Self Care | Payer: BC Managed Care – PPO | Source: Home / Self Care

## 2019-07-02 DIAGNOSIS — R590 Localized enlarged lymph nodes: Secondary | ICD-10-CM

## 2019-07-02 DIAGNOSIS — J029 Acute pharyngitis, unspecified: Secondary | ICD-10-CM | POA: Diagnosis not present

## 2019-07-02 DIAGNOSIS — R21 Rash and other nonspecific skin eruption: Secondary | ICD-10-CM

## 2019-07-02 NOTE — ED Provider Notes (Signed)
[facility]    CSN: [id number] Arrival date & time: 07/02/19  1035      History   Chief Complaint Chief Complaint  Patient presents with  . Mass    HPI Elizabeth Copeland is a 49 y.o. female.   HPI Elizabeth Copeland is a 49 y.o. female presenting to UC with c/o sore throat and a tender lump on the Right side of her neck that started last night. She reports having a forehead temp of 100-101*F last night depending on where she placed the thermometer.  Mild body aches and fatigue but she reports hx of similar symptoms chronically. Body aches and fatigue no worse than usual for pt.  She did receive her shingles vaccine about 1 week ago and has noticed a red itchy rash near the injection site over the last 2-3 days. She has not tried anything for the rash but she has been taking Advil for her throat pain.  She is able to keep down fluids.  No trouble breathing.  Denies cough, congestion, n/v/d. No sick contacts or recent travel.   Past Medical History:  Diagnosis Date  . Anemia   . Back pain   . Complication of anesthesia    patient has not received anesthesia before but states father and brother have a hard time waking up from anesthesia  . Family history of adverse reaction to anesthesia    mother "has a hard time waking up and moody"   . Fibroid   . Headache   . Morbid obesity ([place])   . Pneumonia     Patient Active Problem List   Diagnosis Date Noted  . Left midfoot sprain 09/27/2018  . Metabolic syndrome 123XX123  . Fibromyalgia 12/02/2017  . Plantar fasciitis of left foot 06/28/2017  . Trochanteric bursitis of right hip 06/28/2017  . Fatty liver 09/02/2016  . Morbid obesity ([place]) 07/21/2015  . Intramural leiomyoma of uterus 06/03/2015  . Breast mass, right 06/03/2015  . Vitamin D deficiency 06/03/2015  . Anemia, iron deficiency 06/03/2015  . Microcytic anemia 05/07/2015  . Essential hypertension, benign 05/06/2015  . Ulnar neuropathy of right upper extremity  02/19/2014  . Uterine fibroid with menorrhagia and anemia 12/25/2012  . Right lumbar radiculitis 12/19/2012  . Back pain 02/17/2011  . BMI 40.0-44.9, adult ([place]) 02/17/2011    Past Surgical History:  Procedure Laterality Date  . BREAST BIOPSY Right    benign  . CESAREAN SECTION  2000  . DILATION AND CURETTAGE OF UTERUS N/A 09/01/2015   Procedure: DILATATION AND CURETTAGE;  Surgeon: Emily Filbert, MD;  Location: [facility];  Service: Gynecology;  Laterality: N/A;  . IR GENERIC HISTORICAL  11/18/2015   IR US GUIDE VASC ACCESS RIGHT 11/18/2015 [facility]  . IR GENERIC HISTORICAL  11/18/2015   IR ANGIOGRAM SELECTIVE EACH ADDITIONAL VESSEL 11/18/2015 [facility]  . IR GENERIC HISTORICAL  11/18/2015   IR EMBO TUMOR ORGAN ISCHEMIA INFARCT INC GUIDE ROADMAPPING 11/18/2015 [facility]  . IR GENERIC HISTORICAL  11/18/2015   IR ANGIOGRAM PELVIS SELECTIVE OR SUPRASELECTIVE 11/18/2015 [facility]  . IR GENERIC HISTORICAL  11/18/2015   IR ANGIOGRAM SELECTIVE EACH ADDITIONAL VESSEL 11/18/2015 [facility]  . IR GENERIC HISTORICAL  11/18/2015   IR ANGIOGRAM PELVIS SELECTIVE OR SUPRASELECTIVE 11/18/2015 [facility]  . IR GENERIC HISTORICAL  02/23/2016   IR RADIOLOGIST EVAL & MGMT 02/23/2016 Jacqulynn Cadet, MD [facility]  . IR GENERIC HISTORICAL  12/02/2015   IR RADIOLOGIST EVAL & MGMT 12/02/2015  Greggory Keen, MD [facility]  . IR RADIOLOGIST EVAL & MGMT  08/24/2016  . WISDOM TOOTH EXTRACTION      OB History    Gravida  1   Para  1   Term      Preterm      AB      Living  1     SAB      TAB      Ectopic      Multiple      Live Births               Home Medications    Prior to Admission medications   Medication Sig Start Date End Date Taking? Authorizing Provider  gabapentin (NEURONTIN) 800 MG tablet TAKE 1 TABLET BY MOUTH IN THE MORNING, 1 TABLET IN THE MIDDAY, 2 TABLETS IN THE EVENING 05/14/19   Silverio Decamp, MD  lisinopril-hydrochlorothiazide  (ZESTORETIC) 20-12.5 MG tablet TAKE 1 TABLET BY MOUTH DAILY 05/31/19   Hali Marry, MD  Milnacipran HCl (SAVELLA) 100 MG TABS tablet Take 1 tablet (100 mg total) by mouth 2 (two) times daily. 06/26/19   Hali Marry, MD  omeprazole (PRILOSEC) 40 MG capsule Take one cap PO once daily 30 minutes AC 03/27/19   Kandra Nicolas, MD    Family History Family History  Problem Relation Age of Onset  . Hypertension Other   . Diabetes Father     Social History Social History   Tobacco Use  . Smoking status: Former Smoker    Packs/day: 0.50    Years: 15.00    Pack years: 7.50    Types: Cigarettes    Quit date: 04/20/2002    Years since quitting: 17.2  . Smokeless tobacco: Never Used  Substance Use Topics  . Alcohol use: No  . Drug use: No     Allergies   Patient has no known allergies.   Review of Systems Review of Systems  Constitutional: Positive for fatigue and fever. Negative for chills.  HENT: Positive for sore throat. Negative for congestion, ear pain, trouble swallowing and voice change.   Respiratory: Negative for cough and shortness of breath.   Cardiovascular: Negative for chest pain and palpitations.  Gastrointestinal: Negative for abdominal pain, diarrhea, nausea and vomiting.  Musculoskeletal: Positive for arthralgias, back pain, myalgias and neck pain. Negative for neck stiffness.  Skin: Negative for rash.  Neurological: Negative for dizziness, light-headedness and headaches.  All other systems reviewed and are negative.    Physical Exam Triage Vital Signs ED Triage Vitals  Enc Vitals Group     BP 07/02/19 1103 138/90     Pulse Rate 07/02/19 1103 99     Resp 07/02/19 1103 18     Temp 07/02/19 1103 99.7 F (37.6 C)     Temp Source 07/02/19 1103 Oral     SpO2 07/02/19 1103 98 %     Weight 07/02/19 1059 220 lb (99.8 kg)     Height 07/02/19 1059 5\' 3"  (1.6 m)     Head Circumference --      Peak Flow --      Pain Score 07/02/19 1059 6      Pain Loc --      Pain Edu? --      Excl. in [place]? --    No data found.  Updated Vital Signs BP 138/90 (BP Location: Left Arm)   Pulse 99   Temp 99.7 F (37.6 C) (Oral)  Resp 18   Ht 5\' 3"  (1.6 m)   Wt 220 lb (99.8 kg)   LMP 06/16/2019   SpO2 98%   BMI 38.97 kg/m   Visual Acuity Right Eye Distance:   Left Eye Distance:   Bilateral Distance:    Right Eye Near:   Left Eye Near:    Bilateral Near:     Physical Exam Vitals and nursing note reviewed.  Constitutional:      General: She is not in acute distress.    Appearance: Normal appearance. She is well-developed. She is not ill-appearing, toxic-appearing or diaphoretic.  HENT:     Head: Normocephalic and atraumatic.     Right Ear: Tympanic membrane and ear canal normal.     Left Ear: Tympanic membrane and ear canal normal.     Nose: Nose normal.     Right Sinus: No maxillary sinus tenderness or frontal sinus tenderness.     Left Sinus: No maxillary sinus tenderness or frontal sinus tenderness.     Mouth/Throat:     Lips: Pink.     Mouth: Mucous membranes are moist.     Pharynx: Oropharynx is clear. Uvula midline. No pharyngeal swelling, oropharyngeal exudate, posterior oropharyngeal erythema or uvula swelling.     Tonsils: No tonsillar exudate or tonsillar abscesses.  Cardiovascular:     Rate and Rhythm: Normal rate and regular rhythm.  Pulmonary:     Effort: Pulmonary effort is normal. No respiratory distress.     Breath sounds: Normal breath sounds. No stridor. No wheezing, rhonchi or rales.  Musculoskeletal:        General: Normal range of motion.     Cervical back: Normal range of motion.  Skin:    General: Skin is warm and dry.     Findings: Rash present.     Comments: Right upper arm: faint erythematous urticarial rash. Blanches. Non-tender. No bleeding or discharge.  Neurological:     Mental Status: She is alert and oriented to person, place, and time.  Psychiatric:        Behavior: Behavior normal.       UC Treatments / Results  Labs (all labs ordered are listed, but only abnormal results are displayed) Labs Reviewed - No data to display  EKG   Radiology No results found.  Procedures Procedures (including critical care time)  Medications Ordered in UC Medications - No data to display  Initial Impression / Assessment and Plan / UC Course  I have reviewed the triage vital signs and the nursing notes.  Pertinent labs & imaging results that were available during my care of the patient were reviewed by me and considered in my medical decision making (see chart for details).     Throat exam not c/w strep Rash on arm- suspected mild allergic reaction to recent shingles vaccine. Symptoms viral vs immune response to recent shingles vaccine. No evidence of bacterial infection at this time Encouraged symptomatic tx AVS provided.  Final Clinical Impressions(s) / UC Diagnoses   Final diagnoses:  Acute pharyngitis, unspecified etiology  Cervical lymphadenopathy  Rash, skin     Discharge Instructions      You may apply over the counter cortisone cream or take an antihistamine such as Claritin or Zyrtec to help with the itchy rash on your arm.  You may take 500mg  acetaminophen every 4-6 hours or in combination with ibuprofen 400-600mg  every 6-8 hours as needed for pain, inflammation, and fever.  Be sure to well hydrated with clear liquids  and get at least 8 hours of sleep at night, preferably more while sick.   Please follow up with family medicine in 4-5 days if not improving, sooner if worsening throat pain, rash is spreading beyond your upper arm, fever over 101*F for 3 days or other new concerning symptoms develop.      ED Prescriptions    None     PDMP not reviewed this encounter.   Noe Gens, [place] 07/02/19 1126

## 2019-07-02 NOTE — Discharge Instructions (Signed)
  You may apply over the counter cortisone cream or take an antihistamine such as Claritin or Zyrtec to help with the itchy rash on your arm.  You may take 500mg  acetaminophen every 4-6 hours or in combination with ibuprofen 400-600mg  every 6-8 hours as needed for pain, inflammation, and fever.  Be sure to well hydrated with clear liquids and get at least 8 hours of sleep at night, preferably more while sick.   Please follow up with family medicine in 4-5 days if not improving, sooner if worsening throat pain, rash is spreading beyond your upper arm, fever over 101*F for 3 days or other new concerning symptoms develop.

## 2019-07-02 NOTE — ED Triage Notes (Signed)
Pt c/o sore throat and lump on the RT side of her neck x 1 day.

## 2019-07-04 ENCOUNTER — Encounter: Payer: Self-pay | Admitting: Medical-Surgical

## 2019-07-04 ENCOUNTER — Telehealth (INDEPENDENT_AMBULATORY_CARE_PROVIDER_SITE_OTHER): Payer: BC Managed Care – PPO | Admitting: Medical-Surgical

## 2019-07-04 VITALS — Temp 98.9°F

## 2019-07-04 DIAGNOSIS — J029 Acute pharyngitis, unspecified: Secondary | ICD-10-CM | POA: Diagnosis not present

## 2019-07-04 DIAGNOSIS — R509 Fever, unspecified: Secondary | ICD-10-CM | POA: Diagnosis not present

## 2019-07-04 DIAGNOSIS — J069 Acute upper respiratory infection, unspecified: Secondary | ICD-10-CM | POA: Diagnosis not present

## 2019-07-04 MED ORDER — AMOXICILLIN 500 MG PO TABS: 500.0000 mg | ORAL_TABLET | Freq: Two times a day (BID) | ORAL | 0 refills | Status: AC

## 2019-07-04 NOTE — Progress Notes (Signed)
Virtual Visit via Video Note  I connected with Elizabeth Copeland on 07/04/19 at  1:00 PM EDT by a video enabled telemedicine application and verified that I am speaking with the correct person using two identifiers.   I discussed the limitations of evaluation and management by telemedicine and the availability of in person appointments. The patient expressed understanding and agreed to proceed.  Subjective:    CC: sore throat with upper respiratory sxs  HPI: Pleasant 49 year old presenting via [place] video visit to discuss severe sore throat accompanied by difficulty swallowing, fever, chills, fatigue, decreased appetite, mild nonproductive cough, and lymphadenopathy.  Reports "feeling as if something is stuck in the back of her throat".  Reports sudden onset of symptoms 3 days prior.  No known sick contacts.  Has been taking Tylenol and ibuprofen without relief.  Reports TheraFlu is the only thing that has helped with symptoms and fever so far.  Also tried salt water gargle and drinking hot black tea, minimal relief.  Denies shortness of breath, body aches.  Has not been infected or received vaccines for COVID-19.   Past medical history, Surgical history, Family history not pertinant except as noted below, Social history, Allergies, and medications have been entered into the medical record, reviewed, and corrections made.   Review of Systems: No night sweats, weight loss, chest pain, or shortness of breath.   Objective:    General: Speaking clearly in complete sentences without any shortness of breath.  Alert and oriented x3.  Normal judgment. No apparent acute distress.  Impression and Recommendations:    Acute upper respiratory infection/sore throat Unclear etiology: Covid vs influenza vs strep pharyngitis vs Common cold.   Recommend Covid testing.  Inability to perform physical exam and swab for strep.  With severity of sore throat, tonsillar swelling and erythema, and fever, treating  empirically for strep pharyngitis with amoxicillin 500 mg twice daily x10 days.  Continue supportive care with salt water gargles, tea with honey, warm liquids, Tylenol, ibuprofen, or other OTC cold and flu preparations.   I discussed the assessment and treatment plan with the patient. The patient was provided an opportunity to ask questions and all were answered. The patient agreed with the plan and demonstrated an understanding of the instructions.   The patient was advised to call back or seek an in-person evaluation if the symptoms worsen or if the condition fails to improve as anticipated.  Return if symptoms worsen or fail to improve.  20 minutes of non-face-to-face time was provided during this encounter.  Clearnce Sorrel, DNP, APRN, FNP-BC [facility]

## 2019-07-09 ENCOUNTER — Encounter: Payer: Self-pay | Admitting: Family Medicine

## 2019-07-10 ENCOUNTER — Ambulatory Visit: Payer: BC Managed Care – PPO

## 2019-07-10 ENCOUNTER — Other Ambulatory Visit: Payer: Self-pay

## 2019-07-10 ENCOUNTER — Emergency Department (INDEPENDENT_AMBULATORY_CARE_PROVIDER_SITE_OTHER)
Admission: EM | Admit: 2019-07-10 | Discharge: 2019-07-10 | Disposition: A | Discharge status: Home / Self Care | Payer: BC Managed Care – PPO | Source: Home / Self Care

## 2019-07-10 DIAGNOSIS — R05 Cough: Secondary | ICD-10-CM | POA: Diagnosis not present

## 2019-07-10 DIAGNOSIS — J029 Acute pharyngitis, unspecified: Secondary | ICD-10-CM | POA: Diagnosis not present

## 2019-07-10 DIAGNOSIS — R590 Localized enlarged lymph nodes: Secondary | ICD-10-CM

## 2019-07-10 DIAGNOSIS — R058 Other specified cough: Secondary | ICD-10-CM

## 2019-07-10 DIAGNOSIS — R5383 Other fatigue: Secondary | ICD-10-CM | POA: Diagnosis not present

## 2019-07-10 LAB — POCT CBC W AUTO DIFF (K'VILLE URGENT CARE)

## 2019-07-10 LAB — POCT MONO SCREEN (KUC): Mono, POC: NEGATIVE

## 2019-07-10 MED ORDER — PREDNISONE 20 MG PO TABS: ORAL_TABLET | ORAL | 0 refills | Status: DC

## 2019-07-10 NOTE — Telephone Encounter (Signed)
Patient was offered a virtual appointment for further evaluation. She was not happy that she could not be seen in the office. She was offered an virtual and declined at this time.   She is going to UC to be evaluated.No other concerns.

## 2019-07-10 NOTE — ED Provider Notes (Addendum)
[facility]    CSN: [id number] Arrival date & time: 07/10/19  1436      History   Chief Complaint Chief Complaint  Patient presents with  . Sore Throat    HPI Elizabeth Copeland is a 49 y.o. female.   HPI  Elizabeth Copeland is a 49 y.o. female presenting to UC with c/o continued sore throat for 8 days.  She was seen at [facility] on 07/02/19, one week after receiving her shingles vaccine. She had a rash on her Right arm at that time, which has since resolved.  She completed a video visit with her PCP on 07/04/19, was treated empirically with amoxicillin (10 day course) for strep throat as throat pain had worsened. She reports having about 4-5 days left of her antibiotic.  She was also seen at another urgent care in person on 07/04/19, had a negative strep test but was advised to still take the prescribed amoxicillin. Pt states she is feeling a little better but throat pain, fatigue and lymph nodes still swollen. She also developed a dry cough yesterday. She called her PCP, who offered a virtual visit but pt declined as she prefers in-person visits.  No known exposure to covid. No recent travel. No known hx of mono but she notes her daughter had her tonsils removed recently.    Past Medical History:  Diagnosis Date  . Anemia   . Back pain   . Complication of anesthesia    patient has not received anesthesia before but states father and brother have a hard time waking up from anesthesia  . Family history of adverse reaction to anesthesia    mother "has a hard time waking up and moody"   . Fibroid   . Headache   . Morbid obesity ([place])   . Pneumonia     Patient Active Problem List   Diagnosis Date Noted  . Left midfoot sprain 09/27/2018  . Metabolic syndrome 123XX123  . Fibromyalgia 12/02/2017  . Plantar fasciitis of left foot 06/28/2017  . Trochanteric bursitis of right hip 06/28/2017  . Fatty liver 09/02/2016  . Morbid obesity ([place]) 07/21/2015  . Intramural leiomyoma of uterus  06/03/2015  . Breast mass, right 06/03/2015  . Vitamin D deficiency 06/03/2015  . Anemia, iron deficiency 06/03/2015  . Microcytic anemia 05/07/2015  . Essential hypertension, benign 05/06/2015  . Ulnar neuropathy of right upper extremity 02/19/2014  . Uterine fibroid with menorrhagia and anemia 12/25/2012  . Right lumbar radiculitis 12/19/2012  . Back pain 02/17/2011  . BMI 40.0-44.9, adult ([place]) 02/17/2011    Past Surgical History:  Procedure Laterality Date  . BREAST BIOPSY Right    benign  . CESAREAN SECTION  2000  . DILATION AND CURETTAGE OF UTERUS N/A 09/01/2015   Procedure: DILATATION AND CURETTAGE;  Surgeon: Emily Filbert, MD;  Location: [facility];  Service: Gynecology;  Laterality: N/A;  . IR GENERIC HISTORICAL  11/18/2015   IR US GUIDE VASC ACCESS RIGHT 11/18/2015 [facility]  . IR GENERIC HISTORICAL  11/18/2015   IR ANGIOGRAM SELECTIVE EACH ADDITIONAL VESSEL 11/18/2015 [facility]  . IR GENERIC HISTORICAL  11/18/2015   IR EMBO TUMOR ORGAN ISCHEMIA INFARCT INC GUIDE ROADMAPPING 11/18/2015 [facility]  . IR GENERIC HISTORICAL  11/18/2015   IR ANGIOGRAM PELVIS SELECTIVE OR SUPRASELECTIVE 11/18/2015 [facility]  . IR GENERIC HISTORICAL  11/18/2015   IR ANGIOGRAM SELECTIVE EACH ADDITIONAL VESSEL 11/18/2015 [facility]  . IR GENERIC HISTORICAL  11/18/2015   IR ANGIOGRAM PELVIS  SELECTIVE OR SUPRASELECTIVE 11/18/2015 [facility]  . IR GENERIC HISTORICAL  02/23/2016   IR RADIOLOGIST EVAL & MGMT 02/23/2016 Jacqulynn Cadet, MD [facility]  . IR GENERIC HISTORICAL  12/02/2015   IR RADIOLOGIST EVAL & MGMT 12/02/2015 Greggory Keen, MD [facility]  . IR RADIOLOGIST EVAL & MGMT  08/24/2016  . WISDOM TOOTH EXTRACTION      OB History    Gravida  1   Para  1   Term      Preterm      AB      Living  1     SAB      TAB      Ectopic      Multiple      Live Births               Home Medications    Prior to Admission medications   Medication Sig Start  Date End Date Taking? Authorizing Provider  amoxicillin (AMOXIL) 500 MG tablet Take 1 tablet (500 mg total) by mouth 2 (two) times daily for 10 days. 07/04/19 07/14/19 Yes Jessup, Joy, NP  gabapentin (NEURONTIN) 800 MG tablet TAKE 1 TABLET BY MOUTH IN THE MORNING, 1 TABLET IN THE MIDDAY, 2 TABLETS IN THE EVENING 05/14/19   Silverio Decamp, MD  lisinopril-hydrochlorothiazide (ZESTORETIC) 20-12.5 MG tablet TAKE 1 TABLET BY MOUTH DAILY 05/31/19   Hali Marry, MD  omeprazole (PRILOSEC) 40 MG capsule Take one cap PO once daily 30 minutes AC Patient taking differently: Take one cap PO once daily 30 minutes AC PRN 03/27/19   Kandra Nicolas, MD  predniSONE (DELTASONE) 20 MG tablet 3 tabs po day one, then 2 po daily x 4 days 07/10/19   Noe Gens, PA-C    Family History Family History  Problem Relation Age of Onset  . Hypertension Other   . Diabetes Father     Social History Social History   Tobacco Use  . Smoking status: Former Smoker    Packs/day: 0.50    Years: 15.00    Pack years: 7.50    Types: Cigarettes    Quit date: 04/20/2002    Years since quitting: 17.2  . Smokeless tobacco: Never Used  Substance Use Topics  . Alcohol use: No  . Drug use: No     Allergies   Savella [milnacipran hcl]   Review of Systems Review of Systems  Constitutional: Positive for fatigue. Negative for chills and fever.  HENT: Positive for sore throat. Negative for congestion, ear pain, trouble swallowing and voice change.   Respiratory: Positive for cough (dry). Negative for shortness of breath.   Cardiovascular: Negative for chest pain and palpitations.  Gastrointestinal: Negative for abdominal pain, diarrhea, nausea and vomiting.  Musculoskeletal: Positive for arthralgias, back pain, myalgias and neck pain (sore lymph nodes). Negative for neck stiffness.       Body aches  Skin: Negative for rash.  Neurological: Negative for dizziness, light-headedness and headaches.  All other  systems reviewed and are negative.    Physical Exam Triage Vital Signs ED Triage Vitals  Enc Vitals Group     BP 07/10/19 1457 118/84     Pulse Rate 07/10/19 1457 98     Resp 07/10/19 1457 18     Temp 07/10/19 1457 99 F (37.2 C)     Temp Source 07/10/19 1457 Oral     SpO2 07/10/19 1457 100 %     Weight --  Height --      Head Circumference --      Peak Flow --      Pain Score 07/10/19 1455 5     Pain Loc --      Pain Edu? --      Excl. in [place]? --    No data found.  Updated Vital Signs BP 118/84 (BP Location: Right Arm)   Pulse 98   Temp 99 F (37.2 C) (Oral)   Resp 18   LMP 06/16/2019   SpO2 100%     Physical Exam Vitals and nursing note reviewed.  Constitutional:      General: She is not in acute distress.    Appearance: She is well-developed. She is not ill-appearing, toxic-appearing or diaphoretic.  HENT:     Head: Normocephalic and atraumatic.     Comments: Mild bilateral tonsillar swelling. No erythema or exudates. Uvula is midline. No evidence of tonsillar abscess.    Right Ear: Tympanic membrane and ear canal normal.     Left Ear: Tympanic membrane and ear canal normal.     Nose: Nose normal.     Right Sinus: No maxillary sinus tenderness or frontal sinus tenderness.     Left Sinus: No maxillary sinus tenderness or frontal sinus tenderness.     Mouth/Throat:     Lips: Pink.     Mouth: Mucous membranes are moist.     Pharynx: Oropharynx is clear. Uvula midline. No pharyngeal swelling, oropharyngeal exudate, posterior oropharyngeal erythema or uvula swelling.     Tonsils: No tonsillar exudate or tonsillar abscesses. 3+ on the right. 3+ on the left.  Cardiovascular:     Rate and Rhythm: Normal rate and regular rhythm.  Pulmonary:     Effort: Pulmonary effort is normal. No respiratory distress.     Breath sounds: Normal breath sounds. No stridor. No wheezing, rhonchi or rales.  Abdominal:     General: There is no distension.     Palpations: Abdomen  is soft.     Tenderness: There is no abdominal tenderness.  Musculoskeletal:        General: Normal range of motion.     Cervical back: Normal range of motion and neck supple.  Lymphadenopathy:     Cervical: Cervical adenopathy present.  Skin:    General: Skin is warm and dry.     Findings: No erythema or rash.  Neurological:     Mental Status: She is alert and oriented to person, place, and time.  Psychiatric:        Behavior: Behavior normal.      UC Treatments / Results  Labs (all labs ordered are listed, but only abnormal results are displayed) Labs Reviewed  NOVEL CORONAVIRUS, NAA  POCT CBC W AUTO DIFF ([place])  POCT MONO SCREEN ([facility])    EKG   Radiology No results found.  Procedures Procedures (including critical care time)  Medications Ordered in UC Medications - No data to display  Initial Impression / Assessment and Plan / UC Course  I have reviewed the triage vital signs and the nursing notes.  Pertinent labs & imaging results that were available during my care of the patient were reviewed by me and considered in my medical decision making (see chart for details).    CBC: unremarkable Rapid mono: negative Discussed EBV send out labs to check for recent or prior infection, pt declined stating "I think that would be a waste of time"   Covid-19: pending.  Discussed starting pt on prednisone for suspected mono. Pt was not prescribed prednisone during first visit to [facility] given recent shingles vaccine and concern for dampening immune response. Pt is now about 2 weeks out from that vaccine.   Pt agreeable to try trial of oral prednisone. Declined IM steroids in clinic today.  Encouraged f/u with PCP AVS provided  Final Clinical Impressions(s) / UC Diagnoses   Final diagnoses:  Fatigue, unspecified type  Sore throat  Cervical lymphadenopathy  Dry cough     Discharge Instructions      You may take 500mg  acetaminophen every 4-6 hours  or in combination with ibuprofen 400-600mg  every 6-8 hours as needed for pain, inflammation, and fever.  Be sure to well hydrated with clear liquids and get at least 8 hours of sleep at night, preferably more while sick.   Please follow up with family medicine in 1 week if needed.  If labs and/or imaging was ordered during your visit, you will ONLY be notified of abnormal test results or if medication changes are indicated. You may view all lab and imaging results in your free [facility] My[facility] Account.  If you do not have an account, please follow the instructions in this discharge paperwork.  The code provided for new patients is only good for 1 month so be sure to activate as soon as possible.      ED Prescriptions    Medication Sig Dispense Auth. Provider   predniSONE (DELTASONE) 20 MG tablet 3 tabs po day one, then 2 po daily x 4 days 11 tablet Noe Gens, PA-C     PDMP not reviewed this encounter.      Noe Gens, [place] 07/10/19 [phone]

## 2019-07-10 NOTE — ED Triage Notes (Signed)
Patient presents to Urgent Care with complaints of continued sore throat since  A week ago. Patient reports she came in to be seen last week for same. Has been able to eat some food but has been nauseous. Pt endorses fatigue as well. As been on the antibiotic she was given during her last visit, which she thinks is helping. Has been taking ibuprofen for the fevers she gets in the evening.

## 2019-07-10 NOTE — Discharge Instructions (Signed)
  You may take 500mg  acetaminophen every 4-6 hours or in combination with ibuprofen 400-600mg  every 6-8 hours as needed for pain, inflammation, and fever.  Be sure to well hydrated with clear liquids and get at least 8 hours of sleep at night, preferably more while sick.   Please follow up with family medicine in 1 week if needed.  If labs and/or imaging was ordered during your visit, you will ONLY be notified of abnormal test results or if medication changes are indicated. You may view all lab and imaging results in your free [facility] MyChart Account.  If you do not have an account, please follow the instructions in this discharge paperwork.  The code provided for new patients is only good for 1 month so be sure to activate as soon as possible.

## 2019-07-11 ENCOUNTER — Encounter: Payer: Self-pay | Admitting: Family Medicine

## 2019-07-11 LAB — SARS-COV-2, NAA 2 DAY TAT

## 2019-07-11 LAB — NOVEL CORONAVIRUS, NAA: SARS-CoV-2, NAA: NOT DETECTED

## 2019-07-24 ENCOUNTER — Encounter: Payer: Self-pay | Admitting: Family Medicine

## 2019-07-24 ENCOUNTER — Telehealth (INDEPENDENT_AMBULATORY_CARE_PROVIDER_SITE_OTHER): Payer: BC Managed Care – PPO | Admitting: Family Medicine

## 2019-07-24 DIAGNOSIS — J029 Acute pharyngitis, unspecified: Secondary | ICD-10-CM | POA: Diagnosis not present

## 2019-07-24 DIAGNOSIS — H579 Unspecified disorder of eye and adnexa: Secondary | ICD-10-CM

## 2019-07-24 MED ORDER — OLOPATADINE HCL 0.2 % OP SOLN: 1.0000 [drp] | Freq: Every day | OPHTHALMIC | 0 refills | Status: DC

## 2019-07-24 NOTE — Progress Notes (Signed)
She reports that her eyes have been very watery. She states that her R eye more than her L.  She stated that she has been taking zyrtec in the mornings,benadryl in the evenings, and Robitussin PM for cough.   She was seen in UC on 07/10/2019 and was tested for strep,covid, both were neg.   As I verified her medications she stated that she had some issues w/Omeprazole and was told that she may be allergic. She doesn't think that she is so this was left on her med list for now. She feels that it was due to her sore throat.

## 2019-07-24 NOTE — Progress Notes (Signed)
Virtual Visit via Video Note  I connected with Elizabeth Copeland on 07/24/19 at  8:10 AM EDT by a video enabled telemedicine application and verified that I am speaking with the correct person using two identifiers.   I discussed the limitations of evaluation and management by telemedicine and the availability of in person appointments. The patient expressed understanding and agreed to proceed.  Subjective:    CC: ST  HPI: 49 year old female is doing a virtual visit today for persistent sore throat and allergy symptoms.  She is actually been seen multiple times for persistent sore throat.  It is increased in intensity on and off since then.  Is even gotten so sore that its been painful for her to eat and swallow at one point.  She was actually seen at urgent care on March 30, April 1, April 7, and now today.  So this has been going on for at least 4 weeks.  She will even ran a fever at one point just for a couple of days but then it resolved.  She has been tested for Covid, mono, strep and all were negative.  She just had a CBC done a couple of weeks ago and it was normal.  She is also having significant eye symptoms including tearing, redness itching and burning.  She does usually get eye symptoms in the spring with her allergies.  She says she always has a little bit of a low level of irritation but it does ramp up during these times.  She has been taking oral Zyrtec and then about a week ago actually started Flonase but has not had any significant improvement in her symptoms.  She is also having persistent cough.  She feels that the cough is coming from the throat area occasionally feels like it is a little bit deeper in her chest.  No shortness of breath.   Past medical history, Surgical history, Family history not pertinant except as noted below, Social history, Allergies, and medications have been entered into the medical record, reviewed, and corrections made.   Review of Systems: No fevers,  chills, night sweats, weight loss, chest pain, or shortness of breath.   Objective:    General: Speaking clearly in complete sentences without any shortness of breath.  Alert and oriented x3.  Normal judgment. No apparent acute distress.    Impression and Recommendations:    No problem-specific Assessment & Plan notes found for this encounter.  Persistent sore throat for greater than 1 month-we will check for CMV.  She is already negative for mono, strep, Covid.  We will go ahead and refer her to ENT for further evaluation and work-up as well she could have some other type of issue going on such as esophagitis or thrush or chronic postnasal drip.  She is really not having any persistent sinus symptoms such as nasal congestion or facial pain or pressure but consider chronic subacute sinusitis which could be causing some chronic postnasal drip which is keeping her throat irritated.  Again we will check for CMV.  Allergic eye symptoms-we will call in a prescription for Pataday which is now generic and over-the-counter but will see hopefully for insurance will help cover it.  She has addressed this with her eye doctor before but they had prescribed some eyedrops that were extremely expensive and she was never able to get them.  Allergies-consider referral to an allergist especially if she is having a lot of breakthrough symptoms on dual therapy for allergies.  Time spent in encounter 21 minutes  I discussed the assessment and treatment plan with the patient. The patient was provided an opportunity to ask questions and all were answered. The patient agreed with the plan and demonstrated an understanding of the instructions.   The patient was advised to call back or seek an in-person evaluation if the symptoms worsen or if the condition fails to improve as anticipated.   Elizabeth Lecher, MD

## 2019-07-25 DIAGNOSIS — J029 Acute pharyngitis, unspecified: Secondary | ICD-10-CM | POA: Diagnosis not present

## 2019-07-26 LAB — CMV ABS, IGG+IGM (CYTOMEGALOVIRUS)
CMV IgM: <30 AU/mL
Cytomegalovirus Ab-IgG: <0.6 U/mL

## 2019-07-31 DIAGNOSIS — J0391 Acute recurrent tonsillitis, unspecified: Secondary | ICD-10-CM | POA: Diagnosis not present

## 2019-07-31 DIAGNOSIS — K219 Gastro-esophageal reflux disease without esophagitis: Secondary | ICD-10-CM | POA: Diagnosis not present

## 2019-08-20 ENCOUNTER — Encounter: Payer: Self-pay | Admitting: Family Medicine

## 2019-08-20 DIAGNOSIS — F439 Reaction to severe stress, unspecified: Secondary | ICD-10-CM

## 2019-09-03 ENCOUNTER — Ambulatory Visit: Payer: BC Managed Care – PPO | Admitting: Nurse Practitioner

## 2019-09-03 ENCOUNTER — Encounter: Payer: Self-pay | Admitting: Nurse Practitioner

## 2019-09-03 VITALS — BP 130/90 | HR 81 | Temp 98.1°F | Ht 66.0 in | Wt 224.1 lb

## 2019-09-03 DIAGNOSIS — G44209 Tension-type headache, unspecified, not intractable: Secondary | ICD-10-CM

## 2019-09-03 DIAGNOSIS — H8111 Benign paroxysmal vertigo, right ear: Secondary | ICD-10-CM | POA: Diagnosis not present

## 2019-09-03 MED ORDER — MECLIZINE HCL 25 MG PO TABS: 25.0000 mg | ORAL_TABLET | Freq: Three times a day (TID) | ORAL | 3 refills | Status: DC | PRN

## 2019-09-03 MED ORDER — ONDANSETRON HCL 8 MG PO TABS: 8.0000 mg | ORAL_TABLET | Freq: Three times a day (TID) | ORAL | 2 refills | Status: DC | PRN

## 2019-09-03 NOTE — Patient Instructions (Addendum)
You can look on YouTube for videos that describe the maneuvers for Vertigo or BPPV.   You can also look up progressive muscle relaxation videos on youtube to help with symptoms in your body associated with stress including headache, neck pain, back pain, and others.   Benign Positional Vertigo Vertigo is the feeling that you or your surroundings are moving when they are not. Benign positional vertigo is the most common form of vertigo. This is usually a harmless condition (benign). This condition is positional. This means that symptoms are triggered by certain movements and positions. This condition can be dangerous if it occurs while you are doing something that could cause harm to you or others. This includes activities such as driving or operating machinery. What are the causes? In many cases, the cause of this condition is not known. It may be caused by a disturbance in an area of the inner ear that helps your brain to sense movement and balance. This disturbance can be caused by:  Viral infection (labyrinthitis).  Head injury.  Repetitive motion, such as jumping, dancing, or running. What increases the risk? You are more likely to develop this condition if:  You are a woman.  You are 51 years of age or older. What are the signs or symptoms? Symptoms of this condition usually happen when you move your head or your eyes in different directions. Symptoms may start suddenly, and usually last for less than a minute. They include:  Loss of balance and falling.  Feeling like you are spinning or moving.  Feeling like your surroundings are spinning or moving.  Nausea and vomiting.  Blurred vision.  Dizziness.  Involuntary eye movement (nystagmus). Symptoms can be mild and cause only minor problems, or they can be severe and interfere with daily life. Episodes of benign positional vertigo may return (recur) over time. Symptoms may improve over time. How is this diagnosed? This  condition may be diagnosed based on:  Your medical history.  Physical exam of the head, neck, and ears.  Tests, such as: ? MRI. ? CT scan. ? Eye movement tests. Your health care provider may ask you to change positions quickly while he or she watches you for symptoms of benign positional vertigo, such as nystagmus. Eye movement may be tested with a variety of exams that are designed to evaluate or stimulate vertigo. ? An electroencephalogram (EEG). This records electrical activity in your brain. ? Hearing tests. You may be referred to a health care provider who specializes in ear, nose, and throat (ENT) problems (otolaryngologist) or a provider who specializes in disorders of the nervous system (neurologist). How is this treated?  This condition may be treated in a session in which your health care provider moves your head in specific positions to adjust your inner ear back to normal. Treatment for this condition may take several sessions. Surgery may be needed in severe cases, but this is rare. In some cases, benign positional vertigo may resolve on its own in 2-4 weeks. Follow these instructions at home: Safety  Move slowly. Avoid sudden body or head movements or certain positions, as told by your health care provider.  Avoid driving until your health care provider says it is safe for you to do so.  Avoid operating heavy machinery until your health care provider says it is safe for you to do so.  Avoid doing any tasks that would be dangerous to you or others if vertigo occurs.  If you have trouble walking or  keeping your balance, try using a cane for stability. If you feel dizzy or unstable, sit down right away.  Return to your normal activities as told by your health care provider. Ask your health care provider what activities are safe for you. General instructions  Take over-the-counter and prescription medicines only as told by your health care provider.  Drink enough fluid  to keep your urine pale yellow.  Keep all follow-up visits as told by your health care provider. This is important. Contact a health care provider if:  You have a fever.  Your condition gets worse or you develop new symptoms.  Your family or friends notice any behavioral changes.  You have nausea or vomiting that gets worse.  You have numbness or a "pins and needles" sensation. Get help right away if you:  Have difficulty speaking or moving.  Are always dizzy.  Faint.  Develop severe headaches.  Have weakness in your legs or arms.  Have changes in your hearing or vision.  Develop a stiff neck.  Develop sensitivity to light. Summary  Vertigo is the feeling that you or your surroundings are moving when they are not. Benign positional vertigo is the most common form of vertigo.  The cause of this condition is not known. It may be caused by a disturbance in an area of the inner ear that helps your brain to sense movement and balance.  Symptoms include loss of balance and falling, feeling that you or your surroundings are moving, nausea and vomiting, and blurred vision.  This condition can be diagnosed based on symptoms, physical exam, and other tests, such as MRI, CT scan, eye movement tests, and hearing tests.  Follow safety instructions as told by your health care provider. You will also be told when to contact your health care provider in case of problems. This information is not intended to replace advice given to you by your health care provider. Make sure you discuss any questions you have with your health care provider. Document Revised: 08/30/2017 Document Reviewed: 08/30/2017 Elsevier Patient Education  [place].    How to Perform the Epley Maneuver The Epley maneuver is an exercise that relieves symptoms of vertigo. Vertigo is the feeling that you or your surroundings are moving when they are not. When you feel vertigo, you may feel like the room is  spinning and have trouble walking. Dizziness is a little different than vertigo. When you are dizzy, you may feel unsteady or light-headed. You can do this maneuver at home whenever you have symptoms of vertigo. You can do it up to 3 times a day until your symptoms go away. Even though the Epley maneuver may relieve your vertigo for a few weeks, it is possible that your symptoms will return. This maneuver relieves vertigo, but it does not relieve dizziness. What are the risks? If it is done correctly, the Epley maneuver is considered safe. Sometimes it can lead to dizziness or nausea that goes away after a short time. If you develop other symptoms, such as changes in vision, weakness, or numbness, stop doing the maneuver and call your health care provider. How to perform the Epley maneuver 1. Sit on the edge of a bed or table with your back straight and your legs extended or hanging over the edge of the bed or table. 2. Turn your head halfway toward the affected ear or side. 3. Lie backward quickly with your head turned until you are lying flat on your back. You  may want to position a pillow under your shoulders. 4. Hold this position for 30 seconds. You may experience an attack of vertigo. This is normal. 5. Turn your head to the opposite direction until your unaffected ear is facing the floor. 6. Hold this position for 30 seconds. You may experience an attack of vertigo. This is normal. Hold this position until the vertigo stops. 7. Turn your whole body to the same side as your head. Hold for another 30 seconds. 8. Sit back up. You can repeat this exercise up to 3 times a day. Follow these instructions at home:  After doing the Epley maneuver, you can return to your normal activities.  Ask your health care provider if there is anything you should do at home to prevent vertigo. He or she may recommend that you: ? Keep your head raised (elevated) with two or more pillows while you sleep. ? Do  not sleep on the side of your affected ear. ? Get up slowly from bed. ? Avoid sudden movements during the day. ? Avoid extreme head movement, like looking up or bending over. Contact a health care provider if:  Your vertigo gets worse.  You have other symptoms, including: ? Nausea. ? Vomiting. ? Headache. Get help right away if:  You have vision changes.  You have a severe or worsening headache or neck pain.  You cannot stop vomiting.  You have new numbness or weakness in any part of your body. Summary  Vertigo is the feeling that you or your surroundings are moving when they are not.  The Epley maneuver is an exercise that relieves symptoms of vertigo.  If the Epley maneuver is done correctly, it is considered safe. You can do it up to 3 times a day. This information is not intended to replace advice given to you by your health care provider. Make sure you discuss any questions you have with your health care provider. Document Revised: 03/03/2017 Document Reviewed: 02/09/2016 Elsevier Patient Education  2020 [employer].

## 2019-09-03 NOTE — Progress Notes (Signed)
Acute Office Visit  Subjective:    Patient ID: Elizabeth Copeland, female    DOB: 10/18/1970, 49 y.o.   MRN: [id number]  Chief Complaint  Patient presents with  . Dizziness    Onset:yesterday, dizziness, worse when laying down, room is spinning out of control, denies tunnel vision, last night had facial pressure under eyes, has had allergy HAs for last couple of weeks    HPI Patient is in today for onset of dizziness that started yesterday morning. She reports that she has recently had a upper respiratory infection, for which most symptoms have resolved. She does have a history of seasonal allergies this time of year that are made worse with the changing weather. She has been under stress lately with newly getting married and other life stressors- she had a kitten pass away yesterday, which was particularly upsetting.   DIZZINESS Duration: days Description of symptoms: room spinning Duration of episode: laying down lasts 15-20 minutes, when seated or walking seconds Frequency of symptoms: no history of the same Provoking factors: seems to be worse when stressed out Aggravating factors:  none Triggered by rolling over in bed: yes Triggered by bending over: yes Aggravated by head movement: yes Aggravated by exertion, coughing, loud noises: no Recent head injury: no Recent or current viral symptoms: yes History of vasovagal episodes: no Nausea: yes Vomiting: no Tinnitus: no Hearing loss: no Aural fullness: no Headache: yes Photophobia/phonophobia: no Unsteady gait: yes Postural instability: yes Diplopia, dysarthria, dysphagia or weakness: no Related to exertion: no Pallor: no Diaphoresis: no Dyspnea: no Chest pain: no  HEADACHE She reports frequent headache that feels like "a helmet" around her head. She feels this is brought on by stress. She is under more stress lately with recently getting married. She did lose a kitten yesterday and that was emotionally difficult. She feels  the headaches are manageable, but are "daily".    Past Medical History:  Diagnosis Date  . Anemia   . Back pain   . Complication of anesthesia    patient has not received anesthesia before but states father and brother have a hard time waking up from anesthesia  . Family history of adverse reaction to anesthesia    mother "has a hard time waking up and moody"   . Fibroid   . Headache   . Morbid obesity ([place])   . Pneumonia     Past Surgical History:  Procedure Laterality Date  . BREAST BIOPSY Right    benign  . CESAREAN SECTION  2000  . DILATION AND CURETTAGE OF UTERUS N/A 09/01/2015   Procedure: DILATATION AND CURETTAGE;  Surgeon: Emily Filbert, MD;  Location: [facility];  Service: Gynecology;  Laterality: N/A;  . IR GENERIC HISTORICAL  11/18/2015   IR US GUIDE VASC ACCESS RIGHT 11/18/2015 [facility]  . IR GENERIC HISTORICAL  11/18/2015   IR ANGIOGRAM SELECTIVE EACH ADDITIONAL VESSEL 11/18/2015 [facility]  . IR GENERIC HISTORICAL  11/18/2015   IR EMBO TUMOR ORGAN ISCHEMIA INFARCT INC GUIDE ROADMAPPING 11/18/2015 [facility]  . IR GENERIC HISTORICAL  11/18/2015   IR ANGIOGRAM PELVIS SELECTIVE OR SUPRASELECTIVE 11/18/2015 [facility]  . IR GENERIC HISTORICAL  11/18/2015   IR ANGIOGRAM SELECTIVE EACH ADDITIONAL VESSEL 11/18/2015 [facility]  . IR GENERIC HISTORICAL  11/18/2015   IR ANGIOGRAM PELVIS SELECTIVE OR SUPRASELECTIVE 11/18/2015 [facility]  . IR GENERIC HISTORICAL  02/23/2016   IR RADIOLOGIST EVAL & MGMT 02/23/2016 Jacqulynn Cadet, MD [facility]  . IR GENERIC  HISTORICAL  12/02/2015   IR RADIOLOGIST EVAL & MGMT 12/02/2015 Greggory Keen, MD [facility]  . IR RADIOLOGIST EVAL & MGMT  08/24/2016  . WISDOM TOOTH EXTRACTION      Family History  Problem Relation Age of Onset  . Hypertension Other   . Diabetes Father     Social History   Socioeconomic History  . Marital status: Single    Spouse name: Not on file  . Number of children: 1  . Years of  education: 2 yr coll  . Highest education level: Not on file  Occupational History  . Occupation: member Therapist, occupational    Comment: Forensic scientist  Tobacco Use  . Smoking status: Former Smoker    Packs/day: 0.50    Years: 15.00    Pack years: 7.50    Types: Cigarettes    Quit date: 04/20/2002    Years since quitting: 17.3  . Smokeless tobacco: Never Used  Substance and Sexual Activity  . Alcohol use: No  . Drug use: No  . Sexual activity: Not Currently    Birth control/protection: None  Other Topics Concern  . Not on file  Social History Narrative   No regular exercise.  Lives with her mother [name] and father Jaliyha Perris   Social Determinants of Health   Financial Resource Strain:   . Difficulty of Paying Living Expenses:   Food Insecurity:   . Worried About Charity fundraiser in the Last Year:   . Arboriculturist in the Last Year:   Transportation Needs:   . Film/video editor (Medical):   Marland Kitchen Lack of Transportation (Non-Medical):   Physical Activity:   . Days of Exercise per Week:   . Minutes of Exercise per Session:   Stress:   . Feeling of Stress :   Social Connections:   . Frequency of Communication with Friends and Family:   . Frequency of Social Gatherings with Friends and Family:   . Attends Religious Services:   . Active Member of Clubs or Organizations:   . Attends Archivist Meetings:   Marland Kitchen Marital Status:   Intimate Partner Violence:   . Fear of Current or Ex-Partner:   . Emotionally Abused:   Marland Kitchen Physically Abused:   . Sexually Abused:     Outpatient Medications Prior to Visit  Medication Sig Dispense Refill  . gabapentin (NEURONTIN) 800 MG tablet TAKE 1 TABLET BY MOUTH IN THE MORNING, 1 TABLET IN THE MIDDAY, 2 TABLETS IN THE EVENING 120 tablet 3  . lisinopril-hydrochlorothiazide (ZESTORETIC) 20-12.5 MG tablet TAKE 1 TABLET BY MOUTH DAILY 90 tablet 2  . Olopatadine HCl 0.2 % SOLN Apply 1 drop to eye daily. (Patient  taking differently: Apply 1 drop to eye daily as needed. ) 2.5 mL 0  . omeprazole (PRILOSEC) 40 MG capsule Take one cap PO once daily 30 minutes AC (Patient taking differently: Take one cap PO once daily 30 minutes AC PRN) 15 capsule 1   No facility-administered medications prior to visit.    Allergies  Allergen Reactions  . Savella [Milnacipran Hcl] Nausea And Vomiting    Review of Systems  Constitutional: Positive for fatigue. Negative for activity change, chills and fever.  HENT: Positive for sinus pressure and sinus pain. Negative for congestion, ear pain, facial swelling, hearing loss, sore throat and trouble swallowing.   Eyes: Negative for photophobia, redness and visual disturbance.  Respiratory: Negative for cough, chest tightness and shortness of breath.  Cardiovascular: Negative for chest pain, palpitations and leg swelling.  Gastrointestinal: Positive for diarrhea and nausea. Negative for abdominal pain, constipation and vomiting.  Skin: Negative for color change and pallor.  Allergic/Immunologic: Positive for environmental allergies.  Neurological: Positive for dizziness and headaches. Negative for tremors, seizures, facial asymmetry, speech difficulty, weakness, light-headedness and numbness.  Psychiatric/Behavioral: Positive for sleep disturbance. Negative for dysphoric mood. The patient is nervous/anxious.        Objective:    Physical Exam Vitals and nursing note reviewed.  Constitutional:      Appearance: Normal appearance.  HENT:     Head: Normocephalic.     Right Ear: Hearing, ear canal and external ear normal. No laceration, drainage or tenderness. A middle ear effusion is present. No mastoid tenderness. Tympanic membrane is bulging. Tympanic membrane is not injected or erythematous.     Left Ear: Hearing, ear canal and external ear normal. No laceration, drainage or tenderness.  No middle ear effusion. No mastoid tenderness. Tympanic membrane is bulging.  Tympanic membrane is not injected or erythematous.     Nose: Congestion present.     Mouth/Throat:     Mouth: Mucous membranes are moist.     Pharynx: Oropharynx is clear.  Eyes:     General: Lids are normal. Vision grossly intact. Gaze aligned appropriately. No visual field deficit.    Extraocular Movements:     Right eye: Nystagmus present. Normal extraocular motion.     Left eye: Normal extraocular motion and no nystagmus.     Conjunctiva/sclera: Conjunctivae normal.     Pupils: Pupils are equal, round, and reactive to light.  Cardiovascular:     Rate and Rhythm: Normal rate and regular rhythm.     Pulses: Normal pulses.     Heart sounds: Normal heart sounds.  Pulmonary:     Effort: Pulmonary effort is normal.     Breath sounds: Normal breath sounds.  Abdominal:     General: Abdomen is flat. Bowel sounds are normal.     Palpations: Abdomen is soft.  Musculoskeletal:        General: Normal range of motion.     Right shoulder: Normal strength.     Left shoulder: Normal strength.     Cervical back: Normal range of motion. Rigidity present.     Comments: Tension noted in the neck and shoulders. Muscle strength equal on all 4 extremities.   Lymphadenopathy:     Cervical: No cervical adenopathy.  Skin:    General: Skin is warm and dry.     Capillary Refill: Capillary refill takes less than 2 seconds.  Neurological:     General: No focal deficit present.     Mental Status: She is alert and oriented to person, place, and time.     Cranial Nerves: Cranial nerves are intact.     Sensory: Sensation is intact.     Motor: Motor function is intact.     Coordination: Coordination is intact.     Gait: Gait is intact.     Deep Tendon Reflexes: Reflexes are normal and symmetric.  Psychiatric:        Mood and Affect: Mood normal.        Behavior: Behavior normal.        Thought Content: Thought content normal.        Judgment: Judgment normal.     BP 130/90   Pulse 81   Temp 98.1  F (36.7 C) (Oral)   Ht 5\' 6"  (1.676  m)   Wt 224 lb 1.6 oz (101.7 kg)   SpO2 97%   BMI 36.17 kg/m  Wt Readings from Last 3 Encounters:  09/03/19 224 lb 1.6 oz (101.7 kg)  07/02/19 220 lb (99.8 kg)  06/26/19 224 lb (101.6 kg)    There are no preventive care reminders to display for this patient.  There are no preventive care reminders to display for this patient.   Lab Results  Component Value Date   TSH 1.89 06/26/2019   Lab Results  Component Value Date   WBC 11.0 (H) 03/27/2019   HGB 13.6 03/27/2019   HCT 41.0 03/27/2019   MCV 82.5 03/27/2019   PLT 414 (H) 03/27/2019   Lab Results  Component Value Date   NA 137 03/27/2019   K 3.9 03/27/2019   CO2 27 03/27/2019   GLUCOSE 84 03/27/2019   BUN 11 03/27/2019   CREATININE 0.77 03/27/2019   BILITOT 0.5 03/27/2019   ALKPHOS 62 08/31/2016   AST 13 03/27/2019   ALT 10 03/27/2019   PROT 6.9 03/27/2019   ALBUMIN 4.2 08/31/2016   CALCIUM 10.5 (H) 03/27/2019   ANIONGAP 7 11/18/2015   Lab Results  Component Value Date   CHOL 239 (H) 06/26/2019   Lab Results  Component Value Date   HDL 46 (L) 06/26/2019   Lab Results  Component Value Date   LDLCALC 158 (H) 06/26/2019   Lab Results  Component Value Date   TRIG 193 (H) 06/26/2019   Lab Results  Component Value Date   CHOLHDL 5.2 (H) 06/26/2019   Lab Results  Component Value Date   HGBA1C 5.5 12/14/2016       Assessment & Plan:   1. Benign paroxysmal positional vertigo of right ear BPPV of the right ear, most likely brought on by recent URI, allergy symptoms, and/or increased stress.  Information and at home exercises provided for patient. Patient advised treatment may take a few weeks for complete relief Information provided for emergency symptoms that would warrant re-evaluation.   PLAN: -Perform exercises several times a day until symptoms resolve -Meclizine 3 times a day for the first 7 days then PRN for symptoms -Zofran for symptoms of severe  nausea not resolved with meclizine. - Follow-up if symptoms worsen or fail to improve with treatment options. -May need referral to PT for formal vestibular rehab if at home treatment is not effective.   - meclizine (ANTIVERT) 25 MG tablet; Take 1 tablet (25 mg total) by mouth 3 (three) times daily as needed for dizziness or nausea.  Dispense: 60 tablet; Refill: 3 - ondansetron (ZOFRAN) 8 MG tablet; Take 1 tablet (8 mg total) by mouth every 8 (eight) hours as needed for nausea or vomiting. Take 1 tab (8mg ) every 8 hours as needed for nausea.  Dispense: 30 tablet; Refill: 2  2. Tension headache Tension headache due to increased stressors.  Patient provided with information on progressive muscle relaxation exercises that can be performed at home.   PLAN: -Progressive muscle relaxation exercises to help reduce muscular tension in head, neck, and shoulders. -May take OTC headache medication as directed on the packaging for symptom management -Follow-up if exercises and medications are not effective in headache relief.    Orma Render, NP

## 2019-09-19 ENCOUNTER — Ambulatory Visit (INDEPENDENT_AMBULATORY_CARE_PROVIDER_SITE_OTHER): Payer: BC Managed Care – PPO | Admitting: Psychology

## 2019-09-19 DIAGNOSIS — F4322 Adjustment disorder with anxiety: Secondary | ICD-10-CM | POA: Diagnosis not present

## 2019-09-20 ENCOUNTER — Other Ambulatory Visit: Payer: Self-pay | Admitting: Sports Medicine

## 2019-09-20 DIAGNOSIS — M5416 Radiculopathy, lumbar region: Secondary | ICD-10-CM

## 2019-10-10 ENCOUNTER — Ambulatory Visit (INDEPENDENT_AMBULATORY_CARE_PROVIDER_SITE_OTHER): Payer: BC Managed Care – PPO | Admitting: Psychology

## 2019-10-10 DIAGNOSIS — F4322 Adjustment disorder with anxiety: Secondary | ICD-10-CM | POA: Diagnosis not present

## 2019-10-18 ENCOUNTER — Emergency Department
Admission: EM | Admit: 2019-10-18 | Discharge: 2019-10-18 | Disposition: A | Discharge status: Home / Self Care | Payer: BC Managed Care – PPO | Source: Home / Self Care

## 2019-10-18 ENCOUNTER — Other Ambulatory Visit: Payer: Self-pay

## 2019-10-18 NOTE — ED Triage Notes (Signed)
Patient presents to Urgent Care with complaints of burning sensation to right lateral calf since yesterday late afternoon. Patient reports it is bad all the time, does not matter if she is moving or sitting still. pt states she has L4L5 issues, and a slipped disc. Pt takes gabapentin and has had some advil today, no improvement.

## 2019-10-18 NOTE — ED Notes (Signed)
Patient leaving due to wait time to see provider after triage. Assisted patient with making an appt for tomorrow morning at 0900 prior to her leaving.

## 2019-10-19 ENCOUNTER — Emergency Department (INDEPENDENT_AMBULATORY_CARE_PROVIDER_SITE_OTHER)
Admission: EM | Admit: 2019-10-19 | Discharge: 2019-10-19 | Disposition: A | Discharge status: Home / Self Care | Payer: BC Managed Care – PPO | Source: Home / Self Care | Attending: Family Medicine | Admitting: Family Medicine

## 2019-10-19 ENCOUNTER — Ambulatory Visit (HOSPITAL_BASED_OUTPATIENT_CLINIC_OR_DEPARTMENT_OTHER)
Admission: RE | Admit: 2019-10-19 | Discharge: 2019-10-19 | Disposition: A | Discharge status: Home / Self Care | Payer: BC Managed Care – PPO | Source: Ambulatory Visit | Attending: Family Medicine | Admitting: Family Medicine

## 2019-10-19 VITALS — BP 138/90 | HR 79 | Temp 98.9°F | Resp 16 | Ht 63.0 in | Wt 220.0 lb

## 2019-10-19 DIAGNOSIS — M79661 Pain in right lower leg: Secondary | ICD-10-CM | POA: Insufficient documentation

## 2019-10-19 NOTE — ED Triage Notes (Signed)
Patient has notice burning pain right lateral calf of leg area for past 2 days; no positional difference; no redness or heat. Has not had covid vaccination.

## 2019-10-19 NOTE — ED Notes (Signed)
Called to notify patient Elizabeth Copeland negative for DVT. She will follow with Dr.T.

## 2019-10-19 NOTE — ED Provider Notes (Signed)
[facility]    CSN: [id number] Arrival date & time: 10/19/19  0905      History   Chief Complaint Chief Complaint  Patient presents with  . Leg Pain    HPI Elizabeth Copeland is a 49 y.o. female.   Two days ago patient developed burning pain in her right posterior and lateral calf.  She recalls no injury or change in activities.  She denies shortness of breath or chest pain.  No history of DVT.  The history is provided by the patient.  Leg Pain Location:  Leg Time since incident:  2 days Injury: no   Leg location:  R lower leg Pain details:    Quality:  Burning   Radiates to:  Does not radiate   Severity:  Moderate   Onset quality:  Sudden   Duration:  2 days   Timing:  Constant   Progression:  Worsening Chronicity:  New Prior injury to area:  No Relieved by:  Nothing Worsened by:  Bearing weight Ineffective treatments:  Ice Associated symptoms: no back pain, no decreased ROM, no fatigue, no fever, no muscle weakness, no numbness, no swelling and no tingling   Risk factors: obesity     Past Medical History:  Diagnosis Date  . Anemia   . Back pain   . Complication of anesthesia    patient has not received anesthesia before but states father and brother have a hard time waking up from anesthesia  . Family history of adverse reaction to anesthesia    mother "has a hard time waking up and moody"   . Fibroid   . Headache   . Morbid obesity ([place])   . Pneumonia     Patient Active Problem List   Diagnosis Date Noted  . Left midfoot sprain 09/27/2018  . Metabolic syndrome 99/24/2683  . Fibromyalgia 12/02/2017  . Plantar fasciitis of left foot 06/28/2017  . Trochanteric bursitis of right hip 06/28/2017  . Fatty liver 09/02/2016  . Morbid obesity ([place]) 07/21/2015  . Intramural leiomyoma of uterus 06/03/2015  . Breast mass, right 06/03/2015  . Vitamin D deficiency 06/03/2015  . Anemia, iron deficiency 06/03/2015  . Microcytic anemia 05/07/2015  .  Essential hypertension, benign 05/06/2015  . Ulnar neuropathy of right upper extremity 02/19/2014  . Uterine fibroid with menorrhagia and anemia 12/25/2012  . Right lumbar radiculitis 12/19/2012  . Back pain 02/17/2011  . BMI 40.0-44.9, adult ([place]) 02/17/2011    Past Surgical History:  Procedure Laterality Date  . BREAST BIOPSY Right    benign  . CESAREAN SECTION  2000  . DILATION AND CURETTAGE OF UTERUS N/A 09/01/2015   Procedure: DILATATION AND CURETTAGE;  Surgeon: Emily Filbert, MD;  Location: [facility];  Service: Gynecology;  Laterality: N/A;  . IR GENERIC HISTORICAL  11/18/2015   IR US GUIDE VASC ACCESS RIGHT 11/18/2015 [facility]  . IR GENERIC HISTORICAL  11/18/2015   IR ANGIOGRAM SELECTIVE EACH ADDITIONAL VESSEL 11/18/2015 [facility]  . IR GENERIC HISTORICAL  11/18/2015   IR EMBO TUMOR ORGAN ISCHEMIA INFARCT INC GUIDE ROADMAPPING 11/18/2015 [facility]  . IR GENERIC HISTORICAL  11/18/2015   IR ANGIOGRAM PELVIS SELECTIVE OR SUPRASELECTIVE 11/18/2015 [facility]  . IR GENERIC HISTORICAL  11/18/2015   IR ANGIOGRAM SELECTIVE EACH ADDITIONAL VESSEL 11/18/2015 [facility]  . IR GENERIC HISTORICAL  11/18/2015   IR ANGIOGRAM PELVIS SELECTIVE OR SUPRASELECTIVE 11/18/2015 [facility]  . IR GENERIC HISTORICAL  02/23/2016   IR RADIOLOGIST EVAL &  MGMT 02/23/2016 Jacqulynn Cadet, MD [facility]  . IR GENERIC HISTORICAL  12/02/2015   IR RADIOLOGIST EVAL & MGMT 12/02/2015 Greggory Keen, MD [facility]  . IR RADIOLOGIST EVAL & MGMT  08/24/2016  . WISDOM TOOTH EXTRACTION      OB History    Gravida  1   Para  1   Term      Preterm      AB      Living  1     SAB      TAB      Ectopic      Multiple      Live Births               Home Medications    Prior to Admission medications   Medication Sig Start Date End Date Taking? Authorizing Provider  gabapentin (NEURONTIN) 800 MG tablet TAKE 1 TABLET BY MOUTH IN THE MORNING, 1 TABLET IN THE MIDDAY, 2 TABLETS IN  THE EVENING 09/20/19   Silverio Decamp, MD  lisinopril-hydrochlorothiazide (ZESTORETIC) 20-12.5 MG tablet TAKE 1 TABLET BY MOUTH DAILY 05/31/19   Hali Marry, MD  meclizine (ANTIVERT) 25 MG tablet Take 1 tablet (25 mg total) by mouth 3 (three) times daily as needed for dizziness or nausea. 09/03/19   Orma Render, NP  Olopatadine HCl 0.2 % SOLN Apply 1 drop to eye daily. Patient taking differently: Apply 1 drop to eye daily as needed.  07/24/19   Hali Marry, MD  omeprazole (PRILOSEC) 40 MG capsule Take one cap PO once daily 30 minutes AC Patient taking differently: Take one cap PO once daily 30 minutes AC PRN 03/27/19   Kandra Nicolas, MD  ondansetron (ZOFRAN) 8 MG tablet Take 1 tablet (8 mg total) by mouth every 8 (eight) hours as needed for nausea or vomiting. Take 1 tab (8mg ) every 8 hours as needed for nausea. 09/03/19   Orma Render, NP    Family History Family History  Problem Relation Age of Onset  . Hypertension Other   . Diabetes Father     Social History Social History   Tobacco Use  . Smoking status: Former Smoker    Packs/day: 0.50    Years: 15.00    Pack years: 7.50    Types: Cigarettes    Quit date: 04/20/2002    Years since quitting: 17.5  . Smokeless tobacco: Never Used  Vaping Use  . Vaping Use: Never used  Substance Use Topics  . Alcohol use: Yes    Comment: occ  . Drug use: No     Allergies   Savella [milnacipran hcl]   Review of Systems Review of Systems  Constitutional: Negative for activity change, appetite change, chills, diaphoresis, fatigue and fever.  HENT: Negative.   Eyes: Negative.   Respiratory: Negative for cough, chest tightness, shortness of breath, wheezing and stridor.   Cardiovascular: Negative for chest pain and leg swelling.  Gastrointestinal: Negative.   Genitourinary: Negative.   Musculoskeletal: Negative for back pain.  Skin: Negative for rash.  Neurological: Negative.      Physical Exam Triage  Vital Signs ED Triage Vitals  Enc Vitals Group     BP 10/19/19 0921 138/90     Pulse Rate 10/19/19 0921 79     Resp 10/19/19 0921 16     Temp 10/19/19 0921 98.9 F (37.2 C)     Temp Source 10/19/19 0921 Oral     SpO2 10/19/19 0921  96 %     Weight 10/19/19 0922 220 lb (99.8 kg)     Height 10/19/19 0922 5\' 3"  (1.6 m)     Head Circumference --      Peak Flow --      Pain Score 10/19/19 0922 6     Pain Loc --      Pain Edu? --      Excl. in [place]? --    No data found.  Updated Vital Signs BP 138/90 (BP Location: Right Arm)   Pulse 79   Temp 98.9 F (37.2 C) (Oral)   Resp 16   Ht 5\' 3"  (1.6 m)   Wt 99.8 kg   LMP 10/17/2019   SpO2 96%   BMI 38.97 kg/m   Visual Acuity Right Eye Distance:   Left Eye Distance:   Bilateral Distance:    Right Eye Near:   Left Eye Near:    Bilateral Near:     Physical Exam Vitals and nursing note reviewed.  Constitutional:      General: She is not in acute distress.    Appearance: She is not ill-appearing.  HENT:     Head: Normocephalic.     Nose: Nose normal.     Mouth/Throat:     Pharynx: Oropharynx is clear.  Eyes:     Pupils: Pupils are equal, round, and reactive to light.  Cardiovascular:     Rate and Rhythm: Normal rate and regular rhythm.     Heart sounds: Normal heart sounds.  Pulmonary:     Breath sounds: Normal breath sounds.  Abdominal:     Palpations: Abdomen is soft.     Tenderness: There is no abdominal tenderness.  Musculoskeletal:     Cervical back: Neck supple.     Right lower leg: No edema.     Left lower leg: Edema present.       Legs:     Comments: Tenderness to palpation over the right lower leg anterior compartment and mild tenderness posterior calf.  No erythema or warmth.  Homan's test negative.  Pulses intact.  Lymphadenopathy:     Cervical: No cervical adenopathy.  Skin:    General: Skin is warm and dry.     Findings: No rash.  Neurological:     Mental Status: She is alert and oriented to person,  place, and time.      UC Treatments / Results  Labs (all labs ordered are listed, but only abnormal results are displayed) Labs Reviewed - No data to display  EKG   Radiology No results found.  Procedures Procedures (including critical care time)  Medications Ordered in UC Medications - No data to display  Initial Impression / Assessment and Plan / UC Course  I have reviewed the triage vital signs and the nursing notes.  Pertinent labs & imaging results that were available during my care of the patient were reviewed by me and considered in my medical decision making (see chart for details).    Schedule venous US right lower leg. If negative, suspect shin splints.  Followup with Dr. Aundria Mems ([facility]).    Final Clinical Impressions(s) / UC Diagnoses   Final diagnoses:  Right calf pain     Discharge Instructions     If ultrasound negative for DVT, begin stretching exercises for right lower leg; may take Ibuprofen 200mg , 4 tabs every 8 hours with food.    ED Prescriptions    None  Kandra Nicolas, MD 10/20/19 [phone]

## 2019-10-19 NOTE — Discharge Instructions (Addendum)
If ultrasound negative for DVT, begin stretching exercises for right lower leg; may take Ibuprofen 200mg , 4 tabs every 8 hours with food.

## 2019-10-22 ENCOUNTER — Other Ambulatory Visit: Payer: Self-pay

## 2019-10-22 ENCOUNTER — Ambulatory Visit (INDEPENDENT_AMBULATORY_CARE_PROVIDER_SITE_OTHER): Payer: BC Managed Care – PPO | Admitting: Sports Medicine

## 2019-10-22 DIAGNOSIS — M797 Fibromyalgia: Secondary | ICD-10-CM | POA: Diagnosis not present

## 2019-10-22 DIAGNOSIS — M40292 Other kyphosis, cervical region: Secondary | ICD-10-CM | POA: Diagnosis not present

## 2019-10-22 DIAGNOSIS — M5116 Intervertebral disc disorders with radiculopathy, lumbar region: Secondary | ICD-10-CM | POA: Diagnosis not present

## 2019-10-22 DIAGNOSIS — M5412 Radiculopathy, cervical region: Secondary | ICD-10-CM

## 2019-10-22 DIAGNOSIS — M62830 Muscle spasm of back: Secondary | ICD-10-CM | POA: Diagnosis not present

## 2019-10-22 DIAGNOSIS — M5416 Radiculopathy, lumbar region: Secondary | ICD-10-CM | POA: Diagnosis not present

## 2019-10-22 DIAGNOSIS — M542 Cervicalgia: Secondary | ICD-10-CM | POA: Diagnosis not present

## 2019-10-22 NOTE — Assessment & Plan Note (Signed)
Elizabeth Copeland returns, she has known right-sided lumbar radiculitis, she did well initially with right-sided L5-S1 epidurals, she has also tolerated higher doses of gabapentin. Chiropractic manipulation has also been effective. At this point she does not desire any additional physical therapy, no additional injections, she does want to continue chiropractic manipulation which I think is appropriate, I would also like to add Savella (an SNRI), she did not tolerate Cymbalta but Savella worked well in the recent past. She would also like a second opinion from Dr. Lynann Bologna which I think is appropriate.

## 2019-10-22 NOTE — Progress Notes (Signed)
    Procedures performed today:    None.  Independent interpretation of notes and tests performed by another provider:   None.  Brief History, Exam, Impression, and Recommendations:    Elizabeth Copeland is a 49yo female who comes in with complaints of a burning and painful sensation that radiates down the lateral aspect of her right calf and foot. She noticed the pain last Thursday and received care in the ED including a venous doppler to r/o DVT. She has known radiculitis of the lumbar region that slightly improved with steroid injections. PT did not help the pain. She is currently on 800mg  Gabapentin that she takes 4-6x a day that helps but does not fully resolve the pain. In addition, she describes a buring and tingling sensation in the 3rd and 4th fingers of her right hand that radiates up the arm. Spurling maneuver was positive. She tried cymbalta in the past but had unwanted side effects. Her symptoms are most likely caused by DDD of the lumbar and cervical spine. We are going to add Sevella to furher assist in controlling her pain. She wants a referral to Dr. Othelia Pulling to receive further evaluation on whether surgical intervention could help relieve her pain.   Marcelino Duster, MS3   ___________________________________________ Gwen Her. Dianah Field, M.D., ABFM., CAQSM. Primary Care and [place] Instructor of [place] of [facility]

## 2019-10-22 NOTE — Assessment & Plan Note (Signed)
This 49 year old female also was well controlled on Savella twice a day with gabapentin. She self discontinued and will restart.

## 2019-10-22 NOTE — Assessment & Plan Note (Signed)
I would like Dr. Lynann Bologna to take a look at her cervical spine as well.

## 2019-10-24 ENCOUNTER — Ambulatory Visit (INDEPENDENT_AMBULATORY_CARE_PROVIDER_SITE_OTHER): Payer: BC Managed Care – PPO | Admitting: Psychology

## 2019-10-24 DIAGNOSIS — F4322 Adjustment disorder with anxiety: Secondary | ICD-10-CM

## 2019-10-24 DIAGNOSIS — M797 Fibromyalgia: Secondary | ICD-10-CM

## 2019-10-25 MED ORDER — SAVELLA 100 MG PO TABS: 100.0000 mg | ORAL_TABLET | Freq: Two times a day (BID) | ORAL | 3 refills | Status: DC

## 2019-10-25 NOTE — Telephone Encounter (Signed)
Yes, I sent it back on the 20th of this month, I will send it again.

## 2019-10-25 NOTE — Telephone Encounter (Signed)
Pt stated that provider was going to send in [place] rx to [facility]. Pls advise, thanks.

## 2019-10-28 MED ORDER — DICLOFENAC SODIUM 75 MG PO TBEC: 75.0000 mg | DELAYED_RELEASE_TABLET | Freq: Two times a day (BID) | ORAL | 3 refills | Status: DC

## 2019-11-05 DIAGNOSIS — M5416 Radiculopathy, lumbar region: Secondary | ICD-10-CM | POA: Diagnosis not present

## 2019-11-05 DIAGNOSIS — M533 Sacrococcygeal disorders, not elsewhere classified: Secondary | ICD-10-CM | POA: Diagnosis not present

## 2019-11-05 DIAGNOSIS — M545 Low back pain: Secondary | ICD-10-CM | POA: Diagnosis not present

## 2019-11-14 ENCOUNTER — Other Ambulatory Visit: Payer: Self-pay | Admitting: Orthopedic Surgery

## 2019-11-14 DIAGNOSIS — M5416 Radiculopathy, lumbar region: Secondary | ICD-10-CM

## 2019-11-19 ENCOUNTER — Ambulatory Visit (INDEPENDENT_AMBULATORY_CARE_PROVIDER_SITE_OTHER): Payer: BC Managed Care – PPO

## 2019-11-19 ENCOUNTER — Other Ambulatory Visit: Payer: Self-pay

## 2019-11-19 DIAGNOSIS — M5416 Radiculopathy, lumbar region: Secondary | ICD-10-CM

## 2019-11-19 DIAGNOSIS — M545 Low back pain: Secondary | ICD-10-CM | POA: Diagnosis not present

## 2019-11-25 DIAGNOSIS — M545 Low back pain: Secondary | ICD-10-CM | POA: Diagnosis not present

## 2019-11-25 DIAGNOSIS — M533 Sacrococcygeal disorders, not elsewhere classified: Secondary | ICD-10-CM | POA: Diagnosis not present

## 2019-11-26 ENCOUNTER — Other Ambulatory Visit: Payer: Self-pay

## 2019-11-26 ENCOUNTER — Encounter: Payer: Self-pay | Admitting: Family Medicine

## 2019-11-26 ENCOUNTER — Ambulatory Visit: Payer: BC Managed Care – PPO | Admitting: Family Medicine

## 2019-11-26 DIAGNOSIS — T8090XA Unspecified complication following infusion and therapeutic injection, initial encounter: Secondary | ICD-10-CM | POA: Insufficient documentation

## 2019-11-26 NOTE — Assessment & Plan Note (Signed)
This appears to be injection site reaction.  Recommend cool compress/icing area.  She may use tylenol/ibuprofen as needed for pain.  Instructed to call for follow up if this is worsening.

## 2019-11-26 NOTE — Progress Notes (Signed)
Elizabeth Copeland - 49 y.o. female MRN [id number]  Date of birth: Nov 17, 1970  Subjective Chief Complaint  Patient presents with  . Rash    HPI Elizabeth Copeland is a 49 y.o. female here today with complaint of red rash on L arm.  Reports that rash appeared yesterday.  It is red, warm and tender to touch.  She did receive COVID vaccine in this arm about 1 week ago.  She had similar reaction about 1 week after shingles vaccine although it was more warm and swollen.  She denies fever, chills, difficulty moving her arm or itching around area.   ROS:  A comprehensive ROS was completed and negative except as noted per HPI  No Active Allergies  Past Medical History:  Diagnosis Date  . Anemia   . Back pain   . Complication of anesthesia    patient has not received anesthesia before but states father and brother have a hard time waking up from anesthesia  . Family history of adverse reaction to anesthesia    mother "has a hard time waking up and moody"   . Fibroid   . Headache   . Morbid obesity ([place])   . Pneumonia     Past Surgical History:  Procedure Laterality Date  . BREAST BIOPSY Right    benign  . CESAREAN SECTION  2000  . DILATION AND CURETTAGE OF UTERUS N/A 09/01/2015   Procedure: DILATATION AND CURETTAGE;  Surgeon: Emily Filbert, MD;  Location: [facility];  Service: Gynecology;  Laterality: N/A;  . IR GENERIC HISTORICAL  11/18/2015   IR US GUIDE VASC ACCESS RIGHT 11/18/2015 [facility]  . IR GENERIC HISTORICAL  11/18/2015   IR ANGIOGRAM SELECTIVE EACH ADDITIONAL VESSEL 11/18/2015 [facility]  . IR GENERIC HISTORICAL  11/18/2015   IR EMBO TUMOR ORGAN ISCHEMIA INFARCT INC GUIDE ROADMAPPING 11/18/2015 [facility]  . IR GENERIC HISTORICAL  11/18/2015   IR ANGIOGRAM PELVIS SELECTIVE OR SUPRASELECTIVE 11/18/2015 [facility]  . IR GENERIC HISTORICAL  11/18/2015   IR ANGIOGRAM SELECTIVE EACH ADDITIONAL VESSEL 11/18/2015 [facility]  . IR GENERIC HISTORICAL  11/18/2015   IR ANGIOGRAM PELVIS  SELECTIVE OR SUPRASELECTIVE 11/18/2015 [facility]  . IR GENERIC HISTORICAL  02/23/2016   IR RADIOLOGIST EVAL & MGMT 02/23/2016 Jacqulynn Cadet, MD [facility]  . IR GENERIC HISTORICAL  12/02/2015   IR RADIOLOGIST EVAL & MGMT 12/02/2015 Greggory Keen, MD [facility]  . IR RADIOLOGIST EVAL & MGMT  08/24/2016  . WISDOM TOOTH EXTRACTION      Social History   Socioeconomic History  . Marital status: Single    Spouse name: Not on file  . Number of children: 1  . Years of education: 2 yr coll  . Highest education level: Not on file  Occupational History  . Occupation: member Therapist, occupational    Comment: Forensic scientist  Tobacco Use  . Smoking status: Former Smoker    Packs/day: 0.50    Years: 15.00    Pack years: 7.50    Types: Cigarettes    Quit date: 04/20/2002    Years since quitting: 17.6  . Smokeless tobacco: Never Used  Vaping Use  . Vaping Use: Never used  Substance and Sexual Activity  . Alcohol use: Yes    Comment: occ  . Drug use: No  . Sexual activity: Not Currently    Birth control/protection: None  Other Topics Concern  . Not on file  Social History Narrative   No regular  exercise.  Lives with her mother Elizabeth Copeland and father Elizabeth Copeland   Social Determinants of Health   Financial Resource Strain:   . Difficulty of Paying Living Expenses: Not on file  Food Insecurity:   . Worried About Charity fundraiser in the Last Year: Not on file  . Ran Out of Food in the Last Year: Not on file  Transportation Needs:   . Lack of Transportation (Medical): Not on file  . Lack of Transportation (Non-Medical): Not on file  Physical Activity:   . Days of Exercise per Week: Not on file  . Minutes of Exercise per Session: Not on file  Stress:   . Feeling of Stress : Not on file  Social Connections:   . Frequency of Communication with Friends and Family: Not on file  . Frequency of Social Gatherings with Friends and Family: Not on file  . Attends  Religious Services: Not on file  . Active Member of Clubs or Organizations: Not on file  . Attends Archivist Meetings: Not on file  . Marital Status: Not on file    Family History  Problem Relation Age of Onset  . Hypertension Other   . Diabetes Father     Health Maintenance  Topic Date Due  . Hepatitis C Screening  Never done  . INFLUENZA VACCINE  11/03/2019  . COLONOSCOPY  07/03/2020 (Originally 03/21/2019)  . COVID-19 Vaccine (2 - Moderna 2-dose series) 12/18/2019  . MAMMOGRAM  03/22/2020  . PAP SMEAR-Modifier  10/31/2021  . TETANUS/TDAP  02/21/2023  . HIV Screening  Discontinued     ----------------------------------------------------------------------------------------------------------------------------------------------------------------------------------------------------------------- Physical Exam BP 118/79 (BP Location: Left Arm, Patient Position: Sitting, Cuff Size: Large)   Pulse 71   Temp 97.9 F (36.6 C) (Temporal)   Ht 5' 6.14" (1.68 m)   Wt 225 lb 6.4 oz (102.2 kg)   SpO2 98%   BMI 36.22 kg/m   Physical Exam Constitutional:      Appearance: Normal appearance.  HENT:     Head: Normocephalic and atraumatic.  Eyes:     General: No scleral icterus. Cardiovascular:     Rate and Rhythm: Normal rate and regular rhythm.  Pulmonary:     Effort: Pulmonary effort is normal.     Breath sounds: Normal breath sounds.  Skin:    Comments: Slightly raised erythematous patch extending over upper arm/shoulder area.  Area is warm. Mildly tender to palpation.   Neurological:     General: No focal deficit present.     Mental Status: She is alert.  Psychiatric:        Mood and Affect: Mood normal.        Behavior: Behavior normal.      ------------------------------------------------------------------------------------------------------------------------------------------------------------------------------------------------------------------- Assessment and Plan  Injection site reaction This appears to be injection site reaction.  Recommend cool compress/icing area.  She may use tylenol/ibuprofen as needed for pain.  Instructed to call for follow up if this is worsening.     No orders of the defined types were placed in this encounter.   No follow-ups on file.    This visit occurred during the SARS-CoV-2 public health emergency.  Safety protocols were in place, including screening questions prior to the visit, additional usage of staff PPE, and extensive cleaning of exam room while observing appropriate contact time as indicated for disinfecting solutions.

## 2019-11-27 ENCOUNTER — Ambulatory Visit: Payer: BC Managed Care – PPO | Admitting: Psychology

## 2019-12-10 DIAGNOSIS — M533 Sacrococcygeal disorders, not elsewhere classified: Secondary | ICD-10-CM | POA: Diagnosis not present

## 2019-12-25 ENCOUNTER — Encounter: Payer: Self-pay | Admitting: Family Medicine

## 2019-12-25 ENCOUNTER — Other Ambulatory Visit: Payer: Self-pay

## 2019-12-25 ENCOUNTER — Other Ambulatory Visit: Payer: Self-pay | Admitting: Sports Medicine

## 2019-12-25 ENCOUNTER — Ambulatory Visit (INDEPENDENT_AMBULATORY_CARE_PROVIDER_SITE_OTHER): Payer: BC Managed Care – PPO | Admitting: Sports Medicine

## 2019-12-25 ENCOUNTER — Ambulatory Visit (INDEPENDENT_AMBULATORY_CARE_PROVIDER_SITE_OTHER): Payer: BC Managed Care – PPO

## 2019-12-25 ENCOUNTER — Ambulatory Visit (INDEPENDENT_AMBULATORY_CARE_PROVIDER_SITE_OTHER): Payer: BC Managed Care – PPO | Admitting: Family Medicine

## 2019-12-25 ENCOUNTER — Encounter: Payer: Self-pay | Admitting: Sports Medicine

## 2019-12-25 VITALS — BP 133/70 | HR 74 | Temp 98.1°F | Wt 230.0 lb

## 2019-12-25 DIAGNOSIS — M659 Synovitis and tenosynovitis, unspecified: Secondary | ICD-10-CM

## 2019-12-25 DIAGNOSIS — K760 Fatty (change of) liver, not elsewhere classified: Secondary | ICD-10-CM | POA: Diagnosis not present

## 2019-12-25 DIAGNOSIS — M79644 Pain in right finger(s): Secondary | ICD-10-CM | POA: Diagnosis not present

## 2019-12-25 DIAGNOSIS — Z1159 Encounter for screening for other viral diseases: Secondary | ICD-10-CM

## 2019-12-25 DIAGNOSIS — N281 Cyst of kidney, acquired: Secondary | ICD-10-CM | POA: Insufficient documentation

## 2019-12-25 DIAGNOSIS — I10 Essential (primary) hypertension: Secondary | ICD-10-CM

## 2019-12-25 DIAGNOSIS — M65941 Unspecified synovitis and tenosynovitis, right hand: Secondary | ICD-10-CM | POA: Insufficient documentation

## 2019-12-25 DIAGNOSIS — M7989 Other specified soft tissue disorders: Secondary | ICD-10-CM | POA: Diagnosis not present

## 2019-12-25 MED ORDER — IBUPROFEN 800 MG PO TABS: 800.0000 mg | ORAL_TABLET | Freq: Three times a day (TID) | ORAL | 2 refills | Status: DC | PRN

## 2019-12-25 NOTE — Assessment & Plan Note (Signed)
This is a pleasant 49 year old female with a couple of weeks of pain in her right second PIP. It is minimally swollen, mild loss of range of motion, no triggering. This is likely early osteoarthritis/synovitis, adding x-rays, we will increase her ibuprofen to the prescription strength dose of 800 mg 3 times daily, adding home rehab exercises, return to see me in a month, injection if no better.

## 2019-12-25 NOTE — Assessment & Plan Note (Signed)
Blood pressure little borderline today but she actually forgot to take her medication this morning before leaving the house but normally it is well controlled.  Continue current regimen.  Due for BMP.  Follow-up in 6 months.

## 2019-12-25 NOTE — Progress Notes (Signed)
Established Patient Office Visit  Subjective:  Patient ID: Elizabeth Copeland, female    DOB: 1970/07/29  Age: 49 y.o. MRN: [id number]  CC:  Chief Complaint  Patient presents with  . Hypertension    HPI Elizabeth Copeland presents for   Hypertension- Pt denies chest pain, SOB, dizziness, or heart palpitations.  Taking meds as directed w/o problems.  Denies medication side effects.    She is a little bit nervous about getting her second shingles vaccine as she did experience a local reaction.  She also has local reactions to both Covid vaccines and pat she had her last Covid vaccine at the beginning of the month and still has some burning sensation in her right outer arm.  Has been having more back problems and recently had an MRI.  They noted a spot on her kidney that she wanted me to take a look at on the images.  MRI performed on November 19, 2019 shows a 2.8 cm left renal cyst. Bilatereal renal cysts were noted on lumbar spine from 2018 and MRI Abd form 2018  but not size was noted.      Past Medical History:  Diagnosis Date  . Anemia   . Back pain   . Complication of anesthesia    patient has not received anesthesia before but states father and brother have a hard time waking up from anesthesia  . Family history of adverse reaction to anesthesia    mother "has a hard time waking up and moody"   . Fibroid   . Headache   . Morbid obesity ([place])   . Pneumonia     Past Surgical History:  Procedure Laterality Date  . BREAST BIOPSY Right    benign  . CESAREAN SECTION  2000  . DILATION AND CURETTAGE OF UTERUS N/A 09/01/2015   Procedure: DILATATION AND CURETTAGE;  Surgeon: Emily Filbert, MD;  Location: [facility];  Service: Gynecology;  Laterality: N/A;  . IR GENERIC HISTORICAL  11/18/2015   IR US GUIDE VASC ACCESS RIGHT 11/18/2015 [facility]  . IR GENERIC HISTORICAL  11/18/2015   IR ANGIOGRAM SELECTIVE EACH ADDITIONAL VESSEL 11/18/2015 [facility]  . IR GENERIC HISTORICAL  11/18/2015    IR EMBO TUMOR ORGAN ISCHEMIA INFARCT INC GUIDE ROADMAPPING 11/18/2015 [facility]  . IR GENERIC HISTORICAL  11/18/2015   IR ANGIOGRAM PELVIS SELECTIVE OR SUPRASELECTIVE 11/18/2015 [facility]  . IR GENERIC HISTORICAL  11/18/2015   IR ANGIOGRAM SELECTIVE EACH ADDITIONAL VESSEL 11/18/2015 [facility]  . IR GENERIC HISTORICAL  11/18/2015   IR ANGIOGRAM PELVIS SELECTIVE OR SUPRASELECTIVE 11/18/2015 [facility]  . IR GENERIC HISTORICAL  02/23/2016   IR RADIOLOGIST EVAL & MGMT 02/23/2016 Jacqulynn Cadet, MD [facility]  . IR GENERIC HISTORICAL  12/02/2015   IR RADIOLOGIST EVAL & MGMT 12/02/2015 Greggory Keen, MD [facility]  . IR RADIOLOGIST EVAL & MGMT  08/24/2016  . WISDOM TOOTH EXTRACTION      Family History  Problem Relation Age of Onset  . Hypertension Other   . Diabetes Father     Social History   Socioeconomic History  . Marital status: Married    Spouse name: Fara Olden  . Number of children: 1  . Years of education: 2 yr coll  . Highest education level: Not on file  Occupational History  . Occupation: member Therapist, occupational    Comment: Forensic scientist  Tobacco Use  . Smoking status: Former Smoker    Packs/day: 0.50  Years: 15.00    Pack years: 7.50    Types: Cigarettes    Quit date: 04/20/2002    Years since quitting: 17.7  . Smokeless tobacco: Never Used  Vaping Use  . Vaping Use: Never used  Substance and Sexual Activity  . Alcohol use: Yes    Comment: occ  . Drug use: No  . Sexual activity: Not Currently    Birth control/protection: None  Other Topics Concern  . Not on file  Social History Narrative   No regular exercise.  Lives with her mother [name] and father Amber Williard   Social Determinants of Health   Financial Resource Strain:   . Difficulty of Paying Living Expenses: Not on file  Food Insecurity:   . Worried About Charity fundraiser in the Last Year: Not on file  . Ran Out of Food in the Last Year: Not on file   Transportation Needs:   . Lack of Transportation (Medical): Not on file  . Lack of Transportation (Non-Medical): Not on file  Physical Activity:   . Days of Exercise per Week: Not on file  . Minutes of Exercise per Session: Not on file  Stress:   . Feeling of Stress : Not on file  Social Connections:   . Frequency of Communication with Friends and Family: Not on file  . Frequency of Social Gatherings with Friends and Family: Not on file  . Attends Religious Services: Not on file  . Active Member of Clubs or Organizations: Not on file  . Attends Archivist Meetings: Not on file  . Marital Status: Not on file  Intimate Partner Violence:   . Fear of Current or Ex-Partner: Not on file  . Emotionally Abused: Not on file  . Physically Abused: Not on file  . Sexually Abused: Not on file    Outpatient Medications Prior to Visit  Medication Sig Dispense Refill  . gabapentin (NEURONTIN) 800 MG tablet TAKE 1 TABLET BY MOUTH IN THE MORNING, 1 TABLET IN THE MIDDAY, 2 TABLETS IN THE EVENING 120 tablet 3  . lisinopril-hydrochlorothiazide (ZESTORETIC) 20-12.5 MG tablet TAKE 1 TABLET BY MOUTH DAILY 90 tablet 2  . meclizine (ANTIVERT) 25 MG tablet Take 1 tablet (25 mg total) by mouth 3 (three) times daily as needed for dizziness or nausea. 60 tablet 3  . ondansetron (ZOFRAN) 8 MG tablet Take 1 tablet (8 mg total) by mouth every 8 (eight) hours as needed for nausea or vomiting. Take 1 tab (8mg ) every 8 hours as needed for nausea. 30 tablet 2  . ZIPSOR 25 MG CAPS Take 1 capsule by mouth 4 (four) times daily as needed.    . Olopatadine HCl 0.2 % SOLN Apply 1 drop to eye daily. (Patient not taking: Reported on 12/25/2019) 2.5 mL 0  . omeprazole (PRILOSEC) 40 MG capsule Take one cap PO once daily 30 minutes AC (Patient not taking: Reported on 12/25/2019) 15 capsule 1  . VALIUM 10 MG tablet Take 10 mg by mouth as needed. (Patient not taking: Reported on 12/25/2019)     No facility-administered  medications prior to visit.    No Known Allergies  ROS Review of Systems    Objective:    Physical Exam Constitutional:      Appearance: She is well-developed.  HENT:     Head: Normocephalic and atraumatic.  Cardiovascular:     Rate and Rhythm: Normal rate and regular rhythm.     Heart sounds: Normal heart sounds.  Pulmonary:  Effort: Pulmonary effort is normal.     Breath sounds: Normal breath sounds.  Skin:    General: Skin is warm and dry.  Neurological:     Mental Status: She is alert and oriented to person, place, and time.  Psychiatric:        Behavior: Behavior normal.     BP 133/70 (BP Location: Left Arm, Patient Position: Sitting, Cuff Size: Large)   Pulse 74   Temp 98.1 F (36.7 C) (Oral)   Wt 230 lb (104.3 kg)   BMI 36.96 kg/m  Wt Readings from Last 3 Encounters:  12/25/19 230 lb (104.3 kg)  11/26/19 225 lb 6.4 oz (102.2 kg)  10/19/19 220 lb (99.8 kg)     There are no preventive care reminders to display for this patient.  There are no preventive care reminders to display for this patient.  Lab Results  Component Value Date   TSH 1.89 06/26/2019   Lab Results  Component Value Date   WBC 11.0 (H) 03/27/2019   HGB 13.6 03/27/2019   HCT 41.0 03/27/2019   MCV 82.5 03/27/2019   PLT 414 (H) 03/27/2019   Lab Results  Component Value Date   NA 137 12/25/2019   K 4.1 12/25/2019   CO2 25 12/25/2019   GLUCOSE 86 12/25/2019   BUN 13 12/25/2019   CREATININE 0.66 12/25/2019   BILITOT 0.5 03/27/2019   ALKPHOS 62 08/31/2016   AST 13 03/27/2019   ALT 10 03/27/2019   PROT 6.9 03/27/2019   ALBUMIN 4.2 08/31/2016   CALCIUM 9.0 12/25/2019   ANIONGAP 7 11/18/2015   Lab Results  Component Value Date   CHOL 239 (H) 06/26/2019   Lab Results  Component Value Date   HDL 46 (L) 06/26/2019   Lab Results  Component Value Date   LDLCALC 158 (H) 06/26/2019   Lab Results  Component Value Date   TRIG 193 (H) 06/26/2019   Lab Results   Component Value Date   CHOLHDL 5.2 (H) 06/26/2019   Lab Results  Component Value Date   HGBA1C 5.5 12/14/2016      Assessment & Plan:   Problem List Items Addressed This Visit      Cardiovascular and Mediastinum   Essential hypertension, benign - Primary    Blood pressure little borderline today but she actually forgot to take her medication this morning before leaving the house but normally it is well controlled.  Continue current regimen.  Due for BMP.  Follow-up in 6 months.      Relevant Orders   BASIC METABOLIC PANEL WITH GFR (Completed)     Digestive   Fatty liver    Will follow liver function every 6-12 months.         Genitourinary   Renal cyst, left    2.8 cm seen on MRI from 11/2019. Only noted as bilat cyst back in 2018.  Will move forward with [place] of kidneys to further look at the cyst and evaluate features.  Will likely need repeat in 6-12 months.  Is it less than 3 cm so less likely to be concerning.        Relevant Orders   US Renal    Other Visit Diagnoses    Encounter for hepatitis C screening test for low risk patient       Relevant Orders   Hepatitis C Antibody (Completed)      Just putting off the shingles vaccine a month from her last Covid vaccine just to  make sure that she has time to recover.  No orders of the defined types were placed in this encounter.   Follow-up: Return in about 6 months (around 06/23/2020).    Beatrice Lecher, MD

## 2019-12-25 NOTE — Assessment & Plan Note (Addendum)
2.8 cm seen on MRI from 11/2019. Only noted as bilat cyst back in 2018.  Will move forward with [place] of kidneys to further look at the cyst and evaluate features.  Will likely need repeat in 6-12 months.  Is it less than 3 cm so less likely to be concerning.

## 2019-12-25 NOTE — Progress Notes (Signed)
° ° °  Procedures performed today:    None.  Independent interpretation of notes and tests performed by another provider:   None.  Brief History, Exam, Impression, and Recommendations:    Synovitis of right hand This is a pleasant 49 year old female with a couple of weeks of pain in her right second PIP. It is minimally swollen, mild loss of range of motion, no triggering. This is likely early osteoarthritis/synovitis, adding x-rays, we will increase her ibuprofen to the prescription strength dose of 800 mg 3 times daily, adding home rehab exercises, return to see me in a month, injection if no better.    ___________________________________________ Gwen Her. Dianah Field, M.D., ABFM., CAQSM. Primary Care and [place] Instructor of [place] of [facility]

## 2019-12-26 LAB — BASIC METABOLIC PANEL WITH GFR
BUN: 13 mg/dL (ref 7–25)
CO2: 25 mmol/L (ref 20–32)
Calcium: 9 mg/dL (ref 8.6–10.4)
Chloride: 105 mmol/L (ref 98–110)
Creat: 0.66 mg/dL (ref 0.50–1.05)
GFR, Est African American: 119 mL/min/{1.73_m2} (ref >=60)
GFR, Est Non African American: 103 mL/min/{1.73_m2} (ref >=60)
Glucose, Bld: 86 mg/dL (ref 65–99)
Potassium: 4.1 mmol/L (ref 3.5–5.3)
Sodium: 137 mmol/L (ref 135–146)

## 2019-12-26 LAB — HEPATITIS C ANTIBODY
Hepatitis C Ab: NONREACTIVE
SIGNAL TO CUT-OFF: 0.01 (ref <1.00)

## 2019-12-26 NOTE — Progress Notes (Signed)
All labs are normal. 

## 2019-12-27 ENCOUNTER — Telehealth: Payer: Self-pay

## 2019-12-27 DIAGNOSIS — Z1211 Encounter for screening for malignant neoplasm of colon: Secondary | ICD-10-CM

## 2019-12-27 NOTE — Progress Notes (Signed)
All labs are normal. 

## 2019-12-27 NOTE — Telephone Encounter (Signed)
Patient wanted to know if Dr. Madilyn Fireman saw the results of her MRI done 11/19/19.  She is concerned about a cyst in kidney.    Paraspinal and other soft tissues: 2.8 cm left renal cyst.

## 2019-12-28 ENCOUNTER — Encounter: Payer: Self-pay | Admitting: Family Medicine

## 2019-12-28 NOTE — Assessment & Plan Note (Signed)
Will follow liver function every 6-12 months.

## 2019-12-30 NOTE — Telephone Encounter (Signed)
2.8 cm seen on MRI from 11/2019. Only noted as bilat cyst back in 2018.  Will move forward with [place] of [place] to further look at the cyst and evaluate features.  Will likely need repeat in 6-12 months.  Is it less than 3 cm so less likely to be concerning.

## 2019-12-31 DIAGNOSIS — M533 Sacrococcygeal disorders, not elsewhere classified: Secondary | ICD-10-CM | POA: Diagnosis not present

## 2019-12-31 NOTE — Telephone Encounter (Signed)
Patient advised of recommendations.   Also sent a referral for GI for a colonoscopy.

## 2020-01-08 ENCOUNTER — Ambulatory Visit (INDEPENDENT_AMBULATORY_CARE_PROVIDER_SITE_OTHER): Payer: BC Managed Care – PPO

## 2020-01-08 ENCOUNTER — Other Ambulatory Visit: Payer: Self-pay

## 2020-01-08 DIAGNOSIS — N281 Cyst of kidney, acquired: Secondary | ICD-10-CM

## 2020-01-20 DIAGNOSIS — M545 Low back pain, unspecified: Secondary | ICD-10-CM | POA: Diagnosis not present

## 2020-01-22 ENCOUNTER — Other Ambulatory Visit: Payer: Self-pay

## 2020-01-22 ENCOUNTER — Ambulatory Visit: Payer: BC Managed Care – PPO | Admitting: Sports Medicine

## 2020-01-22 DIAGNOSIS — M533 Sacrococcygeal disorders, not elsewhere classified: Secondary | ICD-10-CM

## 2020-01-22 DIAGNOSIS — M659 Synovitis and tenosynovitis, unspecified: Secondary | ICD-10-CM | POA: Diagnosis not present

## 2020-01-22 DIAGNOSIS — M65941 Unspecified synovitis and tenosynovitis, right hand: Secondary | ICD-10-CM

## 2020-01-22 MED ORDER — TRIAZOLAM 0.25 MG PO TABS: ORAL_TABLET | ORAL | 0 refills | Status: DC

## 2020-01-22 NOTE — Progress Notes (Addendum)
Procedures performed today:    None.  Independent interpretation of notes and tests performed by another provider:   None.  Brief History, Exam, Impression, and Recommendations:    Pain of right sacroiliac joint This is a pleasant 49 year old female, she returns for discussion of her right-sided low back pain. She did not do well with several epidurals including a L5-S1 interlaminar and a right selective L5 nerve root block. Ultimately she was referred to Dr. Mina Marble at [place] for a right sacroiliac joint injection that seemed to provide her with approximately 4 hours of essentially complete relief. She is not interested in returning to that location but I do think this makes her a candidate for radiofrequency ablation. I would like Dr. Francesco Runner to weigh in regarding ablation.  Synovitis of right hand There has also been some right second PIP swelling, synovitis, still present in spite of prescription strength ibuprofen and home rehab exercises. X-rays only showed minimal spurring. On review of her records back in 2019 she did have an elevated CRP and ESR, for this reason we are going to pull the trigger for an additional rheumatoid work-up. I would also like to bring her back for a right second PIP joint injection with triazolam on board.     ___________________________________________ Gwen Her. Dianah Field, M.D., ABFM., CAQSM. Primary Care and [place] Instructor of [place] of [facility]

## 2020-01-22 NOTE — Assessment & Plan Note (Signed)
This is a pleasant 49 year old female, she returns for discussion of her right-sided low back pain. She did not do well with several epidurals including a L5-S1 interlaminar and a right selective L5 nerve root block. Ultimately she was referred to Dr. Mina Marble at [facility] for a right sacroiliac joint injection that seemed to provide her with approximately 4 hours of essentially complete relief. She is not interested in returning to that location but I do think this makes her a candidate for radiofrequency ablation. I would like Dr. Francesco Runner to weigh in regarding ablation.

## 2020-01-22 NOTE — Addendum Note (Signed)
Addended by: Silverio Decamp on: 01/22/2020 09:09 AM   Modules accepted: Orders, Level of Service

## 2020-01-22 NOTE — Assessment & Plan Note (Signed)
There has also been some right second PIP swelling, synovitis, still present in spite of prescription strength ibuprofen and home rehab exercises. X-rays only showed minimal spurring. On review of her records back in 2019 she did have an elevated CRP and ESR, for this reason we are going to pull the trigger for an additional rheumatoid work-up. I would also like to bring her back for a right second PIP joint injection with triazolam on board.

## 2020-01-27 ENCOUNTER — Ambulatory Visit: Payer: BC Managed Care – PPO

## 2020-01-27 ENCOUNTER — Other Ambulatory Visit: Payer: Self-pay | Admitting: Sports Medicine

## 2020-01-27 DIAGNOSIS — M5416 Radiculopathy, lumbar region: Secondary | ICD-10-CM

## 2020-01-27 LAB — RHEUMATOID ARTHRITIS DIAGNOSTIC PANEL, COMPREHENSIVE
Cyclic Citrullin Peptide Ab: <16 Units (ref <20)
Rheumatoid Factor (IgA): <5 U (ref <=6)
Rheumatoid Factor (IgG): <5 U (ref <=6)
Rheumatoid Factor (IgM): <5 U (ref <=6)
SSA (Ro) (ENA) Antibody, IgG: <1 AI
SSB (La) (ENA) Antibody, IgG: <1 AI

## 2020-01-27 LAB — SEDIMENTATION RATE: Sed Rate: 11 mm/h (ref 0–20)

## 2020-01-27 LAB — C-REACTIVE PROTEIN: CRP: 11.8 mg/L — ABNORMAL HIGH (ref <8.0)

## 2020-01-27 LAB — URIC ACID: Uric Acid, Serum: 4.9 mg/dL (ref 2.5–7.0)

## 2020-01-29 ENCOUNTER — Ambulatory Visit (INDEPENDENT_AMBULATORY_CARE_PROVIDER_SITE_OTHER): Payer: BC Managed Care – PPO | Admitting: Sports Medicine

## 2020-01-29 ENCOUNTER — Ambulatory Visit (INDEPENDENT_AMBULATORY_CARE_PROVIDER_SITE_OTHER): Payer: BC Managed Care – PPO

## 2020-01-29 DIAGNOSIS — M659 Synovitis and tenosynovitis, unspecified: Secondary | ICD-10-CM | POA: Diagnosis not present

## 2020-01-29 NOTE — Assessment & Plan Note (Signed)
This pleasant 49 year old female returns, she has some right second PIP swelling, this was still persistent in spite of prescription strength ibuprofen, home rehab, x-rays only showed minimal spurring. We did rheumatoid testing, this was negative with the exception of a persistently elevated CRP, ESR was normal, this is nonspecific. Today we did a right second PIP injection with triazolam on board, return to see me in 1 month.

## 2020-01-29 NOTE — Progress Notes (Signed)
    Procedures performed today:    Procedure: Real-time Ultrasound Guided injection of the right second PIP Device: Samsung HS60  Verbal informed consent obtained.  Time-out conducted.  Noted no overlying erythema, induration, or other signs of local infection.  Skin prepped in a sterile fashion.  Local anesthesia: Topical Ethyl chloride.  With sterile technique and under real time ultrasound guidance:  Minimal synovitis, 1/2 cc lidocaine, 1/2 cc kenalog 40 injected easily.   Completed without difficulty  Pain immediately resolved suggesting accurate placement of the medication.  Advised to call if fevers/chills, erythema, induration, drainage, or persistent bleeding.  Images permanently stored and available for review in PACS.  Impression: Technically successful ultrasound guided injection.  Independent interpretation of notes and tests performed by another provider:   None.  Brief History, Exam, Impression, and Recommendations:    Synovitis of right hand This pleasant 49 year old female returns, she has some right second PIP swelling, this was still persistent in spite of prescription strength ibuprofen, home rehab, x-rays only showed minimal spurring. We did rheumatoid testing, this was negative with the exception of a persistently elevated CRP, ESR was normal, this is nonspecific. Today we did a right second PIP injection with triazolam on board, return to see me in 1 month.    ___________________________________________ Gwen Her. Dianah Field, M.D., ABFM., CAQSM. Primary Care and [place] Instructor of [place] of [facility] of Medicine

## 2020-02-13 DIAGNOSIS — M461 Sacroiliitis, not elsewhere classified: Secondary | ICD-10-CM | POA: Diagnosis not present

## 2020-02-13 DIAGNOSIS — M5459 Other low back pain: Secondary | ICD-10-CM | POA: Diagnosis not present

## 2020-02-13 DIAGNOSIS — M5136 Other intervertebral disc degeneration, lumbar region: Secondary | ICD-10-CM | POA: Diagnosis not present

## 2020-02-13 DIAGNOSIS — M47816 Spondylosis without myelopathy or radiculopathy, lumbar region: Secondary | ICD-10-CM | POA: Diagnosis not present

## 2020-02-26 ENCOUNTER — Ambulatory Visit (INDEPENDENT_AMBULATORY_CARE_PROVIDER_SITE_OTHER): Payer: BC Managed Care – PPO | Admitting: Sports Medicine

## 2020-02-26 DIAGNOSIS — M659 Synovitis and tenosynovitis, unspecified: Secondary | ICD-10-CM | POA: Diagnosis not present

## 2020-02-26 DIAGNOSIS — M65941 Unspecified synovitis and tenosynovitis, right hand: Secondary | ICD-10-CM

## 2020-02-26 MED ORDER — HYDROCODONE-ACETAMINOPHEN 5-325 MG PO TABS: 1.0000 | ORAL_TABLET | Freq: Three times a day (TID) | ORAL | 0 refills | Status: DC | PRN

## 2020-02-26 NOTE — Assessment & Plan Note (Addendum)
Rheumatoid testing was negative with the exception of an elevated CRP, ESR was normal, symptoms resolved after right second PIP injection. Return as needed.  Of note she is going to have her blocks and ablation for her SI joint, she is still having significant pain some happy to give her a bit of hydrocodone to hold her over in the meantime until she can get this approved with Dr. Francesco Runner.

## 2020-02-26 NOTE — Progress Notes (Signed)
    Procedures performed today:    None.  Independent interpretation of notes and tests performed by another provider:   None.  Brief History, Exam, Impression, and Recommendations:    Synovitis of right hand Rheumatoid testing was negative with the exception of an elevated CRP, ESR was normal, symptoms resolved after right second PIP injection. Return as needed.  Of note she is going to have her blocks and ablation for her SI joint, she is still having significant pain some happy to give her a bit of hydrocodone to hold her over in the meantime until she can get this approved with Dr. Francesco Runner.    ___________________________________________ Gwen Her. Dianah Field, M.D., ABFM., CAQSM. Primary Care and [place] Instructor of [place] of [facility] of Medicine

## 2020-03-25 ENCOUNTER — Other Ambulatory Visit: Payer: Self-pay | Admitting: Family Medicine

## 2020-03-25 DIAGNOSIS — I10 Essential (primary) hypertension: Secondary | ICD-10-CM

## 2020-04-13 DIAGNOSIS — U071 COVID-19: Secondary | ICD-10-CM | POA: Diagnosis not present

## 2020-04-15 ENCOUNTER — Encounter: Payer: Self-pay | Admitting: Family Medicine

## 2020-04-15 ENCOUNTER — Telehealth (INDEPENDENT_AMBULATORY_CARE_PROVIDER_SITE_OTHER): Payer: BC Managed Care – PPO | Admitting: Family Medicine

## 2020-04-15 DIAGNOSIS — U071 COVID-19: Secondary | ICD-10-CM | POA: Diagnosis not present

## 2020-04-15 MED ORDER — HYDROCOD POLST-CPM POLST ER 10-8 MG/5ML PO SUER: 5.0000 mL | Freq: Every evening | ORAL | 0 refills | Status: DC | PRN

## 2020-04-15 MED ORDER — AMOXICILLIN-POT CLAVULANATE 875-125 MG PO TABS: 1.0000 | ORAL_TABLET | Freq: Two times a day (BID) | ORAL | 0 refills | Status: DC

## 2020-04-15 NOTE — Progress Notes (Signed)
Virtual Visit via Telephone Note  I connected with Elizabeth Copeland on 04/15/20 at 11:30 AM EST by telephone and verified that I am speaking with the correct person using two identifiers.   I discussed the limitations, risks, security and privacy concerns of performing an evaluation and management service by telephone and the availability of in person appointments. I also discussed with the patient that there may be a patient responsible charge related to this service. The patient expressed understanding and agreed to proceed.  Patient location: at home  Provider loccation: In office     Acute Office Visit  Subjective:    Patient ID: Elizabeth Copeland, female    DOB: November 21, 1970, 50 y.o.   MRN: [id number]  No chief complaint on file.   HPI Patient is in today for COVID-19. She tested pos on 04/13/2020. Felt sick since christmas with her sinuses.  She felt like it was somewhat allergy related though it was a little out of season for her.  But she suddenly felt worse on Sunday 04/12/2020 and went to the urgent care the following day and tested positive. Pos contact at work.   Pt. Stated she has a headache in temples soar throat rt. swollen glands from ear drum to throat, body aches head congestion runny nose and her chest hurts symptoms started on Sunday OTC  Tylenol,  vit D,C liquid zinc 1 oz. and ibuprofen. Feels SOB after coughing. + loose stools. Using Robitussin.    Past Medical History:  Diagnosis Date  . Anemia   . Back pain   . Complication of anesthesia    patient has not received anesthesia before but states father and brother have a hard time waking up from anesthesia  . Family history of adverse reaction to anesthesia    mother "has a hard time waking up and moody"   . Fibroid   . Headache   . Morbid obesity ([place])   . Pneumonia     Past Surgical History:  Procedure Laterality Date  . BREAST BIOPSY Right    benign  . CESAREAN SECTION  2000  . DILATION AND CURETTAGE OF  UTERUS N/A 09/01/2015   Procedure: DILATATION AND CURETTAGE;  Surgeon: Emily Filbert, MD;  Location: [facility];  Service: Gynecology;  Laterality: N/A;  . IR GENERIC HISTORICAL  11/18/2015   IR US GUIDE VASC ACCESS RIGHT 11/18/2015 [facility]  . IR GENERIC HISTORICAL  11/18/2015   IR ANGIOGRAM SELECTIVE EACH ADDITIONAL VESSEL 11/18/2015 [facility]  . IR GENERIC HISTORICAL  11/18/2015   IR EMBO TUMOR ORGAN ISCHEMIA INFARCT INC GUIDE ROADMAPPING 11/18/2015 [facility]  . IR GENERIC HISTORICAL  11/18/2015   IR ANGIOGRAM PELVIS SELECTIVE OR SUPRASELECTIVE 11/18/2015 [facility]  . IR GENERIC HISTORICAL  11/18/2015   IR ANGIOGRAM SELECTIVE EACH ADDITIONAL VESSEL 11/18/2015 [facility]  . IR GENERIC HISTORICAL  11/18/2015   IR ANGIOGRAM PELVIS SELECTIVE OR SUPRASELECTIVE 11/18/2015 [facility]  . IR GENERIC HISTORICAL  02/23/2016   IR RADIOLOGIST EVAL & MGMT 02/23/2016 Jacqulynn Cadet, MD [facility]  . IR GENERIC HISTORICAL  12/02/2015   IR RADIOLOGIST EVAL & MGMT 12/02/2015 Greggory Keen, MD [facility]  . IR RADIOLOGIST EVAL & MGMT  08/24/2016  . WISDOM TOOTH EXTRACTION      Family History  Problem Relation Age of Onset  . Hypertension Other   . Diabetes Father     Social History   Socioeconomic History  . Marital status: Married    Spouse name:  Fara Olden  . Number of children: 1  . Years of education: 2 yr coll  . Highest education level: Not on file  Occupational History  . Occupation: member Therapist, occupational    Comment: Forensic scientist  Tobacco Use  . Smoking status: Former Smoker    Packs/day: 0.50    Years: 15.00    Pack years: 7.50    Types: Cigarettes    Quit date: 04/20/2002    Years since quitting: 18.0  . Smokeless tobacco: Never Used  Vaping Use  . Vaping Use: Never used  Substance and Sexual Activity  . Alcohol use: Yes    Comment: occ  . Drug use: No  . Sexual activity: Not Currently    Birth control/protection: None  Other Topics Concern   . Not on file  Social History Narrative   No regular exercise.  Lives with her mother [name] and father Akiyah Degross   Social Determinants of Health   Financial Resource Strain: Not on file  Food Insecurity: Not on file  Transportation Needs: Not on file  Physical Activity: Not on file  Stress: Not on file  Social Connections: Not on file  Intimate Partner Violence: Not on file    Outpatient Medications Prior to Visit  Medication Sig Dispense Refill  . gabapentin (NEURONTIN) 800 MG tablet TAKE 1 TABLET BY MOUTH IN THE MORNING, 1 TABLET IN THE MIDDAY, 2 TABLETS IN THE EVENING 120 tablet 3  . HYDROcodone-acetaminophen (NORCO/VICODIN) 5-325 MG tablet Take 1 tablet by mouth every 8 (eight) hours as needed for moderate pain. 15 tablet 0  . ibuprofen (ADVIL) 800 MG tablet Take 1 tablet (800 mg total) by mouth every 8 (eight) hours as needed. 90 tablet 2  . lisinopril-hydrochlorothiazide (ZESTORETIC) 20-12.5 MG tablet TAKE 1 TABLET BY MOUTH DAILY 90 tablet 2  . triazolam (HALCION) 0.25 MG tablet 1-2 tabs PO 2 hours before procedure or imaging.  Do not drive with this medication. 2 tablet 0   No facility-administered medications prior to visit.    No Known Allergies  Review of Systems     Objective:    Physical Exam  There were no vitals taken for this visit. Wt Readings from Last 3 Encounters:  12/25/19 230 lb (104.3 kg)  11/26/19 225 lb 6.4 oz (102.2 kg)  10/19/19 220 lb (99.8 kg)    Health Maintenance Due  Topic Date Due  . MAMMOGRAM  03/22/2020    There are no preventive care reminders to display for this patient.   Lab Results  Component Value Date   TSH 1.89 06/26/2019   Lab Results  Component Value Date   WBC 11.0 (H) 03/27/2019   HGB 13.6 03/27/2019   HCT 41.0 03/27/2019   MCV 82.5 03/27/2019   PLT 414 (H) 03/27/2019   Lab Results  Component Value Date   NA 137 12/25/2019   K 4.1 12/25/2019   CO2 25 12/25/2019   GLUCOSE 86 12/25/2019   BUN 13  12/25/2019   CREATININE 0.66 12/25/2019   BILITOT 0.5 03/27/2019   ALKPHOS 62 08/31/2016   AST 13 03/27/2019   ALT 10 03/27/2019   PROT 6.9 03/27/2019   ALBUMIN 4.2 08/31/2016   CALCIUM 9.0 12/25/2019   ANIONGAP 7 11/18/2015   Lab Results  Component Value Date   CHOL 239 (H) 06/26/2019   Lab Results  Component Value Date   HDL 46 (L) 06/26/2019   Lab Results  Component Value Date   LDLCALC 158 (H) 06/26/2019  Lab Results  Component Value Date   TRIG 193 (H) 06/26/2019   Lab Results  Component Value Date   CHOLHDL 5.2 (H) 06/26/2019   Lab Results  Component Value Date   HGBA1C 5.5 12/14/2016       Assessment & Plan:   Problem List Items Addressed This Visit   None   Visit Diagnoses    COVID-19    -  Primary     COVID-19 with severe headache and sinus symptoms.  Though she was having some sinus congestion for a couple of weeks prior to testing positive.  Some consideration for possible underlying sinusitis.  For now suspect this is more viral related.  Recommend symptomatic care.  Prescription sent in for cough medicine as the Robitussin really is not helping.  Hopefully through the weekend she will feel better if she develops any new or worsening symptoms then please let us know.  Right now she is only feeling short of breath right after a coughing fit but not with activity which is reassuring.  Meds ordered this encounter  Medications  . chlorpheniramine-HYDROcodone (TUSSIONEX PENNKINETIC ER) 10-8 MG/5ML SUER    Sig: Take 5 mLs by mouth at bedtime as needed for cough.    Dispense:  120 mL    Refill:  0     I discussed the assessment and treatment plan with the patient. The patient was provided an opportunity to ask questions and all were answered. The patient agreed with the plan and demonstrated an understanding of the instructions.   The patient was advised to call back or seek an in-person evaluation if the symptoms worsen or if the condition fails to  improve as anticipated.  I provided 18 minutes of non-face-to-face time during this encounter.    Beatrice Lecher, MD

## 2020-04-15 NOTE — Progress Notes (Signed)
Pt. Stated she has a headache soar throat rt. swollen glands from ear drum to throat, body aches head congestion runny nose and her chest hurts symptoms started on Sunday OTC  Tylenol,  vit D,C liquid zinc 1 oz. and ibuprofen.

## 2020-04-16 ENCOUNTER — Telehealth: Payer: BC Managed Care – PPO | Admitting: Family Medicine

## 2020-04-21 ENCOUNTER — Telehealth: Payer: Self-pay | Admitting: *Deleted

## 2020-04-21 MED ORDER — CEFDINIR 300 MG PO CAPS: 300.0000 mg | ORAL_CAPSULE | Freq: Two times a day (BID) | ORAL | 0 refills | Status: DC

## 2020-04-21 NOTE — Telephone Encounter (Signed)
Pt already tested positive and wants to know if she should test again?

## 2020-04-21 NOTE — Telephone Encounter (Signed)
Overall gotcha.  No need to retest.  Symptoms can last for weeks after having initial infection.

## 2020-04-21 NOTE — Telephone Encounter (Signed)
Pt called today complaining of GI upset from the Augmentin.  She said it was bad enough that she stopped taking it but said she is still sick.  She wants to know if you could send her something else in and she wants to know if this could still be covid and should she get tested again.  Please advise.

## 2020-04-21 NOTE — Telephone Encounter (Signed)
New prescription sent to pharmacy.  We can always test her again it is always possible to have an early test that is negative and then several days later actually test positive.

## 2020-04-22 NOTE — Telephone Encounter (Signed)
LMOM notifying pt of new rx and that there's no need to retest for covid.

## 2020-05-01 ENCOUNTER — Other Ambulatory Visit: Payer: Self-pay | Admitting: Sports Medicine

## 2020-05-01 DIAGNOSIS — M5416 Radiculopathy, lumbar region: Secondary | ICD-10-CM

## 2020-06-02 ENCOUNTER — Other Ambulatory Visit: Payer: Self-pay | Admitting: Family Medicine

## 2020-06-02 DIAGNOSIS — Z1231 Encounter for screening mammogram for malignant neoplasm of breast: Secondary | ICD-10-CM

## 2020-06-05 ENCOUNTER — Inpatient Hospital Stay: Admission: RE | Admit: 2020-06-05 | Payer: BC Managed Care – PPO | Source: Ambulatory Visit

## 2020-06-05 DIAGNOSIS — Z1231 Encounter for screening mammogram for malignant neoplasm of breast: Secondary | ICD-10-CM

## 2020-06-10 ENCOUNTER — Encounter: Payer: Self-pay | Admitting: Family Medicine

## 2020-06-10 NOTE — Telephone Encounter (Signed)
OK to schedule him with me

## 2020-06-17 ENCOUNTER — Other Ambulatory Visit: Payer: Self-pay | Admitting: Family Medicine

## 2020-06-17 ENCOUNTER — Other Ambulatory Visit: Payer: Self-pay

## 2020-06-17 ENCOUNTER — Ambulatory Visit (INDEPENDENT_AMBULATORY_CARE_PROVIDER_SITE_OTHER): Payer: BC Managed Care – PPO

## 2020-06-17 DIAGNOSIS — Z1231 Encounter for screening mammogram for malignant neoplasm of breast: Secondary | ICD-10-CM

## 2020-06-18 ENCOUNTER — Other Ambulatory Visit: Payer: Self-pay | Admitting: Sports Medicine

## 2020-06-18 DIAGNOSIS — M65941 Unspecified synovitis and tenosynovitis, right hand: Secondary | ICD-10-CM

## 2020-06-18 DIAGNOSIS — M659 Synovitis and tenosynovitis, unspecified: Secondary | ICD-10-CM

## 2020-06-18 MED ORDER — IBUPROFEN 800 MG PO TABS: 800.0000 mg | ORAL_TABLET | Freq: Three times a day (TID) | ORAL | 2 refills | Status: DC | PRN

## 2020-06-19 ENCOUNTER — Other Ambulatory Visit: Payer: Self-pay | Admitting: Family Medicine

## 2020-06-19 DIAGNOSIS — R928 Other abnormal and inconclusive findings on diagnostic imaging of breast: Secondary | ICD-10-CM

## 2020-06-24 ENCOUNTER — Encounter: Payer: Self-pay | Admitting: Family Medicine

## 2020-06-24 ENCOUNTER — Other Ambulatory Visit: Payer: Self-pay

## 2020-06-24 ENCOUNTER — Ambulatory Visit: Payer: BC Managed Care – PPO | Admitting: Family Medicine

## 2020-06-24 VITALS — BP 119/71 | HR 74 | Ht 66.0 in | Wt 231.0 lb

## 2020-06-24 DIAGNOSIS — I10 Essential (primary) hypertension: Secondary | ICD-10-CM | POA: Diagnosis not present

## 2020-06-24 DIAGNOSIS — Z1211 Encounter for screening for malignant neoplasm of colon: Secondary | ICD-10-CM

## 2020-06-24 DIAGNOSIS — F439 Reaction to severe stress, unspecified: Secondary | ICD-10-CM

## 2020-06-24 DIAGNOSIS — F4323 Adjustment disorder with mixed anxiety and depressed mood: Secondary | ICD-10-CM | POA: Diagnosis not present

## 2020-06-24 LAB — CBC
HCT: 41.8 % (ref 35.0–45.0)
Hemoglobin: 14.4 g/dL (ref 11.7–15.5)
MCH: 28.9 pg (ref 27.0–33.0)
MCHC: 34.4 g/dL (ref 32.0–36.0)
MCV: 83.8 fL (ref 80.0–100.0)
MPV: 9.6 fL (ref 7.5–12.5)
Platelets: 341 10*3/uL (ref 140–400)
RBC: 4.99 10*6/uL (ref 3.80–5.10)
RDW: 13.7 % (ref 11.0–15.0)
WBC: 10.3 10*3/uL (ref 3.8–10.8)

## 2020-06-24 LAB — COMPLETE METABOLIC PANEL WITH GFR
AG Ratio: 1.6 (calc) (ref 1.0–2.5)
ALT: 15 U/L (ref 6–29)
AST: 18 U/L (ref 10–35)
Albumin: 4.2 g/dL (ref 3.6–5.1)
Alkaline phosphatase (APISO): 58 U/L (ref 37–153)
BUN: 14 mg/dL (ref 7–25)
CO2: 28 mmol/L (ref 20–32)
Calcium: 9.2 mg/dL (ref 8.6–10.4)
Chloride: 102 mmol/L (ref 98–110)
Creat: 0.77 mg/dL (ref 0.50–1.05)
GFR, Est African American: 104 mL/min/{1.73_m2} (ref >=60)
GFR, Est Non African American: 89 mL/min/{1.73_m2} (ref >=60)
Globulin: 2.7 g/dL (calc) (ref 1.9–3.7)
Glucose, Bld: 87 mg/dL (ref 65–139)
Potassium: 3.6 mmol/L (ref 3.5–5.3)
Sodium: 140 mmol/L (ref 135–146)
Total Bilirubin: 0.4 mg/dL (ref 0.2–1.2)
Total Protein: 6.9 g/dL (ref 6.1–8.1)

## 2020-06-24 LAB — LIPID PANEL W/REFLEX DIRECT LDL
Cholesterol: 229 mg/dL — ABNORMAL HIGH (ref <200)
HDL: 32 mg/dL — ABNORMAL LOW (ref >=50)
LDL Cholesterol (Calc): 137 mg/dL (calc) — ABNORMAL HIGH
Non-HDL Cholesterol (Calc): 197 mg/dL (calc) — ABNORMAL HIGH (ref <130)
Total CHOL/HDL Ratio: 7.2 (calc) — ABNORMAL HIGH (ref <5.0)
Triglycerides: 392 mg/dL — ABNORMAL HIGH (ref <150)

## 2020-06-24 MED ORDER — ESCITALOPRAM OXALATE 5 MG PO TABS: 5.0000 mg | ORAL_TABLET | Freq: Every day | ORAL | 0 refills | Status: DC

## 2020-06-24 NOTE — Progress Notes (Signed)
Established Patient Office Visit  Subjective:  Patient ID: Elizabeth Copeland, female    DOB: 22-Mar-1971  Age: 50 y.o. MRN: [id number]  CC:  Chief Complaint  Patient presents with  . Hypertension    HPI   Elizabeth Copeland presents for   Hypertension- Pt denies chest pain, SOB, dizziness, or heart palpitations.  Taking meds as directed w/o problems.  Denies medication side effects.    She has been under a lot of stress lately.  Her new husband who she just married a little under a year ago his health has been declining significantly he is not been doing well and he is now on dialysis.  She is struggling still helping take care of her elderly father.  In addition work has been somewhat stressful as well.  She just feels like this is a crushing weight on her.  She does not currently have a therapist or counselor but has tried a couple in the last few years that she did not feel really that helpful.  But she is still open to working with a English as a second language teacher.  Past Medical History:  Diagnosis Date  . Anemia   . Back pain   . Complication of anesthesia    patient has not received anesthesia before but states father and brother have a hard time waking up from anesthesia  . Family history of adverse reaction to anesthesia    mother "has a hard time waking up and moody"   . Fibroid   . Headache   . Morbid obesity ([place])   . Pneumonia     Past Surgical History:  Procedure Laterality Date  . BREAST BIOPSY Right    benign  . CESAREAN SECTION  2000  . DILATION AND CURETTAGE OF UTERUS N/A 09/01/2015   Procedure: DILATATION AND CURETTAGE;  Surgeon: Elizabeth Filbert, MD;  Location: [facility];  Service: Gynecology;  Laterality: N/A;  . IR GENERIC HISTORICAL  11/18/2015   IR US GUIDE VASC ACCESS RIGHT 11/18/2015 [facility]  . IR GENERIC HISTORICAL  11/18/2015   IR ANGIOGRAM SELECTIVE EACH ADDITIONAL VESSEL 11/18/2015 [facility]  . IR GENERIC HISTORICAL  11/18/2015   IR EMBO TUMOR ORGAN ISCHEMIA  INFARCT INC GUIDE ROADMAPPING 11/18/2015 [facility]  . IR GENERIC HISTORICAL  11/18/2015   IR ANGIOGRAM PELVIS SELECTIVE OR SUPRASELECTIVE 11/18/2015 [facility]  . IR GENERIC HISTORICAL  11/18/2015   IR ANGIOGRAM SELECTIVE EACH ADDITIONAL VESSEL 11/18/2015 [facility]  . IR GENERIC HISTORICAL  11/18/2015   IR ANGIOGRAM PELVIS SELECTIVE OR SUPRASELECTIVE 11/18/2015 [facility]  . IR GENERIC HISTORICAL  02/23/2016   IR RADIOLOGIST EVAL & MGMT 02/23/2016 Elizabeth Cadet, MD [facility]  . IR GENERIC HISTORICAL  12/02/2015   IR RADIOLOGIST EVAL & MGMT 12/02/2015 Elizabeth Keen, MD [facility]  . IR RADIOLOGIST EVAL & MGMT  08/24/2016  . WISDOM TOOTH EXTRACTION      Family History  Problem Relation Age of Onset  . Hypertension Other   . Diabetes Father     Social History   Socioeconomic History  . Marital status: Married    Spouse name: Elizabeth Copeland  . Number of children: 1  . Years of education: 2 yr coll  . Highest education level: Not on file  Occupational History  . Occupation: member Therapist, occupational    Comment: Forensic scientist  Tobacco Use  . Smoking status: Former Smoker    Packs/day: 0.50    Years: 15.00    Pack  years: 7.50    Types: Cigarettes    Quit date: 04/20/2002    Years since quitting: 18.1  . Smokeless tobacco: Never Used  Vaping Use  . Vaping Use: Never used  Substance and Sexual Activity  . Alcohol use: Yes    Comment: occ  . Drug use: No  . Sexual activity: Not Currently    Birth control/protection: None  Other Topics Concern  . Not on file  Social History Narrative   No regular exercise.  Lives with her mother Elizabeth Copeland and father Elizabeth Copeland   Social Determinants of Health   Financial Resource Strain: Not on file  Food Insecurity: Not on file  Transportation Needs: Not on file  Physical Activity: Not on file  Stress: Not on file  Social Connections: Not on file  Intimate Partner Violence: Not on file    Outpatient  Medications Prior to Visit  Medication Sig Dispense Refill  . gabapentin (NEURONTIN) 800 MG tablet TAKE 1 TABLET BY MOUTH IN THE MORNING, 1 TABLET IN THE MIDDAY, 2 TABLETS IN THE EVENING 120 tablet 3  . ibuprofen (ADVIL) 800 MG tablet Take 1 tablet (800 mg total) by mouth every 8 (eight) hours as needed. 90 tablet 2  . lisinopril-hydrochlorothiazide (ZESTORETIC) 20-12.5 MG tablet TAKE 1 TABLET BY MOUTH DAILY 90 tablet 2  . cefdinir (OMNICEF) 300 MG capsule Take 1 capsule (300 mg total) by mouth 2 (two) times daily. 10 capsule 0  . chlorpheniramine-HYDROcodone (TUSSIONEX PENNKINETIC ER) 10-8 MG/5ML SUER Take 5 mLs by mouth at bedtime as needed for cough. 120 mL 0  . HYDROcodone-acetaminophen (NORCO/VICODIN) 5-325 MG tablet Take 1 tablet by mouth every 8 (eight) hours as needed for moderate pain. 15 tablet 0  . triazolam (HALCION) 0.25 MG tablet 1-2 tabs PO 2 hours before procedure or imaging.  Do not drive with this medication. 2 tablet 0   No facility-administered medications prior to visit.    No Known Allergies  ROS Review of Systems    Objective:    Physical Exam Constitutional:      Appearance: She is well-developed.  HENT:     Head: Normocephalic and atraumatic.  Cardiovascular:     Rate and Rhythm: Normal rate and regular rhythm.     Heart sounds: Normal heart sounds.  Pulmonary:     Effort: Pulmonary effort is normal.     Breath sounds: Normal breath sounds.  Skin:    General: Skin is warm and dry.  Neurological:     Mental Status: She is alert and oriented to person, place, and time.  Psychiatric:        Behavior: Behavior normal.     BP 119/71   Pulse 74   Ht 5\' 6"  (1.676 m)   Wt 231 lb (104.8 kg)   SpO2 100%   BMI 37.28 kg/m  Wt Readings from Last 3 Encounters:  06/24/20 231 lb (104.8 kg)  12/25/19 230 lb (104.3 kg)  11/26/19 225 lb 6.4 oz (102.2 kg)     Health Maintenance Due  Topic Date Due  . COVID-19 Vaccine (3 - Booster for Moderna series)  06/16/2020    There are no preventive care reminders to display for this patient.  Lab Results  Component Value Date   TSH 1.89 06/26/2019   Lab Results  Component Value Date   WBC 11.0 (H) 03/27/2019   HGB 13.6 03/27/2019   HCT 41.0 03/27/2019   MCV 82.5 03/27/2019   PLT 414 (H) 03/27/2019  Lab Results  Component Value Date   NA 137 12/25/2019   K 4.1 12/25/2019   CO2 25 12/25/2019   GLUCOSE 86 12/25/2019   BUN 13 12/25/2019   CREATININE 0.66 12/25/2019   BILITOT 0.5 03/27/2019   ALKPHOS 62 08/31/2016   AST 13 03/27/2019   ALT 10 03/27/2019   PROT 6.9 03/27/2019   ALBUMIN 4.2 08/31/2016   CALCIUM 9.0 12/25/2019   ANIONGAP 7 11/18/2015   Lab Results  Component Value Date   CHOL 239 (H) 06/26/2019   Lab Results  Component Value Date   HDL 46 (L) 06/26/2019   Lab Results  Component Value Date   LDLCALC 158 (H) 06/26/2019   Lab Results  Component Value Date   TRIG 193 (H) 06/26/2019   Lab Results  Component Value Date   CHOLHDL 5.2 (H) 06/26/2019   Lab Results  Component Value Date   HGBA1C 5.5 12/14/2016      Assessment & Plan:   Problem List Items Addressed This Visit      Cardiovascular and Mediastinum   Essential hypertension, benign - Primary    Well controlled. Continue current regimen. Follow up in  6 mo. Due for labs.       Relevant Orders   Lipid Panel w/reflex Direct LDL   COMPLETE METABOLIC PANEL WITH GFR   CBC     Other   Situational mixed anxiety and depressive disorder    Discussed options.  She is really struggling right now trying to keep everything balanced and working full-time helping to take care of her husband whose health is declining quickly and her father.  She is just trying to hold it together.  We did discuss the importance of trying to find a therapist that she can really connect with and that would be very positive for her.  In addition we discussed medication as an option.  She is not really been on prescription  medication for mood particularly she has taken Cymbalta in the past for musculoskeletal issue.  We discussed maybe a trial of Lexapro.      Relevant Medications   escitalopram (LEXAPRO) 5 MG tablet    Other Visit Diagnoses    Stress       Screen for colon cancer       Relevant Orders   Ambulatory referral to Gastroenterology      Meds ordered this encounter  Medications  . escitalopram (LEXAPRO) 5 MG tablet    Sig: Take 1 tablet (5 mg total) by mouth daily.    Dispense:  30 tablet    Refill:  0    Follow-up: Return in about 4 weeks (around 07/22/2020) for New start medication, ok for virtual.  .    Elizabeth Lecher, MD

## 2020-06-24 NOTE — Assessment & Plan Note (Signed)
Discussed options.  She is really struggling right now trying to keep everything balanced and working full-time helping to take care of her husband whose health is declining quickly and her father.  She is just trying to hold it together.  We did discuss the importance of trying to find a therapist that she can really connect with and that would be very positive for her.  In addition we discussed medication as an option.  She is not really been on prescription medication for mood particularly she has taken Cymbalta in the past for musculoskeletal issue.  We discussed maybe a trial of Lexapro.

## 2020-06-24 NOTE — Assessment & Plan Note (Signed)
Well controlled. Continue current regimen. Follow up in  6 mo. Due for labs.  

## 2020-06-29 ENCOUNTER — Ambulatory Visit: Payer: BC Managed Care – PPO | Admitting: Sports Medicine

## 2020-07-01 ENCOUNTER — Ambulatory Visit: Payer: BC Managed Care – PPO | Admitting: Sports Medicine

## 2020-07-07 ENCOUNTER — Ambulatory Visit
Admission: RE | Admit: 2020-07-07 | Discharge: 2020-07-07 | Disposition: A | Discharge status: Home / Self Care | Payer: BC Managed Care – PPO | Source: Ambulatory Visit | Attending: Family Medicine | Admitting: Family Medicine

## 2020-07-07 ENCOUNTER — Other Ambulatory Visit: Payer: Self-pay

## 2020-07-07 ENCOUNTER — Other Ambulatory Visit: Payer: Self-pay | Admitting: Family Medicine

## 2020-07-07 DIAGNOSIS — R922 Inconclusive mammogram: Secondary | ICD-10-CM | POA: Diagnosis not present

## 2020-07-07 DIAGNOSIS — R928 Other abnormal and inconclusive findings on diagnostic imaging of breast: Secondary | ICD-10-CM | POA: Diagnosis not present

## 2020-07-07 DIAGNOSIS — N6323 Unspecified lump in the left breast, lower outer quadrant: Secondary | ICD-10-CM | POA: Diagnosis not present

## 2020-07-13 ENCOUNTER — Ambulatory Visit
Admission: RE | Admit: 2020-07-13 | Discharge: 2020-07-13 | Disposition: A | Discharge status: Home / Self Care | Payer: BC Managed Care – PPO | Source: Ambulatory Visit | Attending: Family Medicine | Admitting: Family Medicine

## 2020-07-13 ENCOUNTER — Other Ambulatory Visit: Payer: BC Managed Care – PPO

## 2020-07-13 ENCOUNTER — Other Ambulatory Visit: Payer: Self-pay

## 2020-07-13 DIAGNOSIS — R928 Other abnormal and inconclusive findings on diagnostic imaging of breast: Secondary | ICD-10-CM

## 2020-07-13 DIAGNOSIS — N6321 Unspecified lump in the left breast, upper outer quadrant: Secondary | ICD-10-CM | POA: Diagnosis not present

## 2020-07-13 DIAGNOSIS — D242 Benign neoplasm of left breast: Secondary | ICD-10-CM | POA: Diagnosis not present

## 2020-07-22 DIAGNOSIS — Z1211 Encounter for screening for malignant neoplasm of colon: Secondary | ICD-10-CM | POA: Diagnosis not present

## 2020-07-22 DIAGNOSIS — K5730 Diverticulosis of large intestine without perforation or abscess without bleeding: Secondary | ICD-10-CM | POA: Diagnosis not present

## 2020-07-22 DIAGNOSIS — M797 Fibromyalgia: Secondary | ICD-10-CM | POA: Diagnosis not present

## 2020-07-22 NOTE — Progress Notes (Signed)
Virtual Visit via Telephone Note  I connected with Elizabeth Copeland on 07/23/20 at  3:20 PM EDT by telephone and verified that I am speaking with the correct person using two identifiers.   I discussed the limitations, risks, security and privacy concerns of performing an evaluation and management service by telephone and the availability of in person appointments. I also discussed with the patient that there may be a patient responsible charge related to this service. The patient expressed understanding and agreed to proceed.  Patient location: at work.  Provider loccation: In office   Subjective:    CC: F/U Mood.    HPI:  Following up in today for new start of Lexapro we started her at 5 mg so far she is actually tolerated it well without any significant side effects.  She feels like she is noticed some slight improvement but it has not been significant.  PHQ9-GAD7 completed.  PHQ went from 12 to 11.  GAD went from 15 to 6.   Actually just had a colonoscopy yesterday and so did not take her Lexapro yesterday but restarted it today and she has been extremely nauseated ever since she is not sure if it is secondary to the colonoscopy or after restarting medication after missing a day.  She feels like the nausea is gotten a little bit better as the day goes on but is not gone.   Past medical history, Surgical history, Family history not pertinant except as noted below, Social history, Allergies, and medications have been entered into the medical record, reviewed, and corrections made.   Review of Systems: No fevers, chills, night sweats, weight loss, chest pain, or shortness of breath.   Objective:    General: Speaking clearly in complete sentences without any shortness of breath.  Alert and oriented x3.  Normal judgment. No apparent acute distress.    Impression and Recommendations:     Situational mixed anxiety and depressive disorder So far she is tolerated the 5 mg well and  actually has had about a 50% improvement in her anxiety symptoms though no significant improvement in her more depression symptoms.  I think she would benefit from an increase in the dose to 10 mg and then plan to follow-up in 3 to 4 weeks to see if she is doing well and if she is noticing a shift and low mood.     I discussed the assessment and treatment plan with the patient. The patient was provided an opportunity to ask questions and all were answered. The patient agreed with the plan and demonstrated an understanding of the instructions.   The patient was advised to call back or seek an in-person evaluation if the symptoms worsen or if the condition fails to improve as anticipated.  I provided 12 minutes of non-face-to-face time during this encounter.   Beatrice Lecher, MD

## 2020-07-23 ENCOUNTER — Telehealth (INDEPENDENT_AMBULATORY_CARE_PROVIDER_SITE_OTHER): Payer: BC Managed Care – PPO | Admitting: Family Medicine

## 2020-07-23 DIAGNOSIS — F4323 Adjustment disorder with mixed anxiety and depressed mood: Secondary | ICD-10-CM

## 2020-07-23 MED ORDER — ESCITALOPRAM OXALATE 10 MG PO TABS: 10.0000 mg | ORAL_TABLET | Freq: Every day | ORAL | 1 refills | Status: DC

## 2020-07-23 NOTE — Progress Notes (Signed)
Patient at work and could not wait on mychart, please call [phone] for appt.   Feels like Lexapro is only making a small difference.  PHQ9-GAD7 completed.  PHQ went from 12 to 11.  GAD went from 15 to 6.

## 2020-07-23 NOTE — Assessment & Plan Note (Signed)
So far she is tolerated the 5 mg well and actually has had about a 50% improvement in her anxiety symptoms though no significant improvement in her more depression symptoms.  I think she would benefit from an increase in the dose to 10 mg and then plan to follow-up in 3 to 4 weeks to see if she is doing well and if she is noticing a shift and low mood.

## 2020-07-31 ENCOUNTER — Other Ambulatory Visit: Payer: Self-pay

## 2020-07-31 ENCOUNTER — Ambulatory Visit: Payer: BC Managed Care – PPO | Admitting: Sports Medicine

## 2020-07-31 DIAGNOSIS — M5416 Radiculopathy, lumbar region: Secondary | ICD-10-CM | POA: Diagnosis not present

## 2020-07-31 DIAGNOSIS — R202 Paresthesia of skin: Secondary | ICD-10-CM

## 2020-07-31 DIAGNOSIS — M659 Synovitis and tenosynovitis, unspecified: Secondary | ICD-10-CM

## 2020-07-31 DIAGNOSIS — G5603 Carpal tunnel syndrome, bilateral upper limbs: Secondary | ICD-10-CM | POA: Insufficient documentation

## 2020-07-31 DIAGNOSIS — M65941 Unspecified synovitis and tenosynovitis, right hand: Secondary | ICD-10-CM

## 2020-07-31 DIAGNOSIS — R200 Anesthesia of skin: Secondary | ICD-10-CM | POA: Diagnosis not present

## 2020-07-31 MED ORDER — PREDNISONE 50 MG PO TABS: ORAL_TABLET | ORAL | 0 refills | Status: DC

## 2020-07-31 NOTE — Assessment & Plan Note (Signed)
Recurrence of right L5 distribution radiculitis, has been carrying multiple heavy boxes around for her husband who is currently on home dialysis. She did have an SI joint injection with Dr. Mina Marble, no relief, not even temporary. Adding a 5-day course of prednisone, home rehab exercises, return to see me in 4 to 6 weeks, right L5-S1 transforaminal epidural if no better.

## 2020-07-31 NOTE — Assessment & Plan Note (Signed)
Did really well after right second PIP injection in October of last year, she is having a recurrence of pain, clinical diagnosis was synovitis, her rheumatoid testing was negative with the exception of an elevated CRP, ESR was normal. Symptoms improved considerably, as above we are can use prednisone so holding off on any injections today.

## 2020-07-31 NOTE — Progress Notes (Signed)
    Procedures performed today:    None.  Independent interpretation of notes and tests performed by another provider:   I did personally review Copeland lumbar spine MRI, she does have very mild multilevel DDD, the right L5 nerve does appear to contact the right L5-S1 disc in an extraforaminal location.  Brief History, Exam, Impression, and Recommendations:    Synovitis of right hand Did really well after right second PIP injection in October of last year, she is having a recurrence of pain, clinical diagnosis was synovitis, Copeland rheumatoid testing was negative with the exception of an elevated CRP, ESR was normal. Symptoms improved considerably, as above we are can use prednisone so holding off on any injections today.  Right lumbar radiculitis Recurrence of right L5 distribution radiculitis, has been carrying multiple heavy boxes around for Copeland husband who is currently on home dialysis. She did have an SI joint injection with Dr. Mina Marble, no relief, not even temporary. Adding a 5-day course of prednisone, home rehab exercises, return to see me in 4 to 6 weeks, right L5-S1 transforaminal epidural if no better.  Numbness and tingling in both hands Numbness and tingling both hands, particularly with repetitive motions, unclear as to whether or not she has symptoms at night, she will be very cognizant as to the precipitators of Copeland symptoms over the next 4 to 6 weeks. If it sounds like carpal tunnel syndrome and a follow-up visit we will consider addition of nighttime splinting +/- median nerve Hydro dissection. Adding conditioning exercises today.    ___________________________________________ Elizabeth Copeland. Elizabeth Copeland, M.D., ABFM., CAQSM. Primary Care and [place] Instructor of [place] [employer] [facility] [employer]

## 2020-07-31 NOTE — Assessment & Plan Note (Signed)
Numbness and tingling both hands, particularly with repetitive motions, unclear as to whether or not she has symptoms at night, she will be very cognizant as to the precipitators of her symptoms over the next 4 to 6 weeks. If it sounds like carpal tunnel syndrome and a follow-up visit we will consider addition of nighttime splinting +/- median nerve Hydro dissection. Adding conditioning exercises today.

## 2020-08-23 ENCOUNTER — Emergency Department (INDEPENDENT_AMBULATORY_CARE_PROVIDER_SITE_OTHER)
Admission: EM | Admit: 2020-08-23 | Discharge: 2020-08-23 | Disposition: A | Discharge status: Home / Self Care | Payer: BC Managed Care – PPO | Source: Home / Self Care

## 2020-08-23 ENCOUNTER — Other Ambulatory Visit: Payer: Self-pay

## 2020-08-23 ENCOUNTER — Emergency Department (INDEPENDENT_AMBULATORY_CARE_PROVIDER_SITE_OTHER): Payer: BC Managed Care – PPO

## 2020-08-23 DIAGNOSIS — S93401A Sprain of unspecified ligament of right ankle, initial encounter: Secondary | ICD-10-CM

## 2020-08-23 DIAGNOSIS — M7989 Other specified soft tissue disorders: Secondary | ICD-10-CM | POA: Diagnosis not present

## 2020-08-23 DIAGNOSIS — M79671 Pain in right foot: Secondary | ICD-10-CM

## 2020-08-23 DIAGNOSIS — M25571 Pain in right ankle and joints of right foot: Secondary | ICD-10-CM | POA: Diagnosis not present

## 2020-08-23 DIAGNOSIS — M25471 Effusion, right ankle: Secondary | ICD-10-CM | POA: Diagnosis not present

## 2020-08-23 DIAGNOSIS — M7731 Calcaneal spur, right foot: Secondary | ICD-10-CM | POA: Diagnosis not present

## 2020-08-23 HISTORY — DX: Essential (primary) hypertension: I10

## 2020-08-23 NOTE — Discharge Instructions (Signed)
No fracture on x-rays. Rest, ice, elevate. Take OTC medicine as needed for discomfort. Wear brace provided - recommend about a week then gradually return to normal use. Early movement can help with healing - do daily range of motion exercises.

## 2020-08-23 NOTE — ED Provider Notes (Signed)
[facility]    CSN: [id number] Arrival date & time: 08/23/20  1431      History   Chief Complaint Chief Complaint  Patient presents with  . Ankle Injury    HPI Elizabeth Copeland is a 50 y.o. female.   Patients with right ankle and foot pain after injury Friday. She states she was in [place] for a wedding and was running in heels and twisted her right foot/ankle, causing her to fall to her knees. She states she took her shoes off and kept running and then continued on with the wedding activities Friday after the injury. She states that evening once things started to calm down she noticed it was hurting, with worsening yesterday. She has pain around her ankle and into the side of her foot. The pain is constant but worse when applying any weight or movements. She has tried OTC medicine without improvement. She denies prior injury or fracture of the area. She does have some tingling in the foot.  The history is provided by the patient.    Past Medical History:  Diagnosis Date  . Anemia   . Back pain   . Complication of anesthesia    patient has not received anesthesia before but states father and brother have a hard time waking up from anesthesia  . Family history of adverse reaction to anesthesia    mother "has a hard time waking up and moody"   . Fibroid   . Headache   . Hypertension   . Morbid obesity ([place])   . Pneumonia     Patient Active Problem List   Diagnosis Date Noted  . Numbness and tingling in both hands 07/31/2020  . Situational mixed anxiety and depressive disorder 06/24/2020  . Renal cyst, left 12/25/2019  . Synovitis of right hand 12/25/2019  . Radiculitis of right cervical region 10/22/2019  . Left midfoot sprain 09/27/2018  . Metabolic syndrome [id number]  . Fibromyalgia 12/02/2017  . Plantar fasciitis of left foot 06/28/2017  . Trochanteric bursitis of right hip 06/28/2017  . Fatty liver 09/02/2016  . Morbid obesity ([place]) 07/21/2015  .  Intramural leiomyoma of uterus 06/03/2015  . Vitamin D deficiency 06/03/2015  . Anemia, iron deficiency 06/03/2015  . Microcytic anemia 05/07/2015  . Essential hypertension, benign 05/06/2015  . Ulnar neuropathy of right upper extremity 02/19/2014  . Uterine fibroid with menorrhagia and anemia 12/25/2012  . Right lumbar radiculitis 12/19/2012  . Back pain 02/17/2011  . BMI 40.0-44.9, adult ([place]) 02/17/2011    Past Surgical History:  Procedure Laterality Date  . BREAST BIOPSY Right    benign  . CESAREAN SECTION  2000  . DILATION AND CURETTAGE OF UTERUS N/A 09/01/2015   Procedure: DILATATION AND CURETTAGE;  Surgeon: Emily Filbert, MD;  Location: [facility];  Service: Gynecology;  Laterality: N/A;  . IR GENERIC HISTORICAL  11/18/2015   IR US GUIDE VASC ACCESS RIGHT 11/18/2015 [facility]  . IR GENERIC HISTORICAL  11/18/2015   IR ANGIOGRAM SELECTIVE EACH ADDITIONAL VESSEL 11/18/2015 [facility]  . IR GENERIC HISTORICAL  11/18/2015   IR EMBO TUMOR ORGAN ISCHEMIA INFARCT INC GUIDE ROADMAPPING 11/18/2015 [facility]  . IR GENERIC HISTORICAL  11/18/2015   IR ANGIOGRAM PELVIS SELECTIVE OR SUPRASELECTIVE 11/18/2015 [facility]  . IR GENERIC HISTORICAL  11/18/2015   IR ANGIOGRAM SELECTIVE EACH ADDITIONAL VESSEL 11/18/2015 [facility]  . IR GENERIC HISTORICAL  11/18/2015   IR ANGIOGRAM PELVIS SELECTIVE OR SUPRASELECTIVE 11/18/2015 [facility]  .  IR GENERIC HISTORICAL  02/23/2016   IR RADIOLOGIST EVAL & MGMT 02/23/2016 Jacqulynn Cadet, MD [facility]  . IR GENERIC HISTORICAL  12/02/2015   IR RADIOLOGIST EVAL & MGMT 12/02/2015 Greggory Keen, MD [facility]  . IR RADIOLOGIST EVAL & MGMT  08/24/2016  . WISDOM TOOTH EXTRACTION      OB History    Gravida  1   Para  1   Term      Preterm      AB      Living  1     SAB      IAB      Ectopic      Multiple      Live Births               Home Medications    Prior to Admission medications   Medication Sig Start Date  End Date Taking? Authorizing Provider  escitalopram (LEXAPRO) 10 MG tablet Take 1 tablet (10 mg total) by mouth daily. 07/23/20   Hali Marry, MD  gabapentin (NEURONTIN) 800 MG tablet TAKE 1 TABLET BY MOUTH IN THE MORNING, 1 TABLET IN THE MIDDAY, 2 TABLETS IN THE EVENING 05/01/20   Silverio Decamp, MD  ibuprofen (ADVIL) 800 MG tablet Take 1 tablet (800 mg total) by mouth every 8 (eight) hours as needed. 06/18/20   Silverio Decamp, MD  lisinopril-hydrochlorothiazide (ZESTORETIC) 20-12.5 MG tablet TAKE 1 TABLET BY MOUTH DAILY 03/25/20   Hali Marry, MD  predniSONE (DELTASONE) 50 MG tablet One tab PO daily for 5 days. 07/31/20   Silverio Decamp, MD    Family History Family History  Problem Relation Age of Onset  . Hypertension Other   . Diabetes Father   . Cancer Mother   . Hypertension Mother     Social History Social History   Tobacco Use  . Smoking status: Former Smoker    Packs/day: 0.50    Years: 15.00    Pack years: 7.50    Types: Cigarettes    Quit date: 04/20/2002    Years since quitting: 18.3  . Smokeless tobacco: Never Used  Vaping Use  . Vaping Use: Never used  Substance Use Topics  . Alcohol use: Yes    Comment: occ  . Drug use: No     Allergies   Patient has no known allergies.   Review of Systems Review of Systems  Constitutional: Negative for fatigue and fever.  Musculoskeletal: Positive for arthralgias, gait problem and joint swelling.  Skin: Negative for color change, rash and wound.  Neurological: Negative for weakness and numbness.     Physical Exam Triage Vital Signs ED Triage Vitals  Enc Vitals Group     BP --      Pulse Rate 08/23/20 1554 84     Resp 08/23/20 1554 16     Temp 08/23/20 1554 99 F (37.2 C)     Temp Source 08/23/20 1554 Oral     SpO2 08/23/20 1554 98 %     Weight 08/23/20 1547 230 lb (104.3 kg)     Height 08/23/20 1547 5' 3.5" (1.613 m)     Head Circumference --      Peak Flow --       Pain Score 08/23/20 1546 8     Pain Loc --      Pain Edu? --      Excl. in [place]? --    No data found.  Updated Vital  Signs Pulse 84   Temp 99 F (37.2 C) (Oral)   Resp 16   Ht 5' 3.5" (1.613 m)   Wt 230 lb (104.3 kg)   LMP 08/03/2020 (Approximate)   SpO2 98%   BMI 40.10 kg/m   Visual Acuity Right Eye Distance:   Left Eye Distance:   Bilateral Distance:    Right Eye Near:   Left Eye Near:    Bilateral Near:     Physical Exam Vitals and nursing note reviewed.  Constitutional:      General: She is not in acute distress. Eyes:     Pupils: Pupils are equal, round, and reactive to light.  Cardiovascular:     Pulses: Normal pulses.  Pulmonary:     Effort: Pulmonary effort is normal.  Musculoskeletal:     Right ankle: Swelling present. No ecchymosis. Tenderness present. Decreased range of motion. Normal pulse.     Comments: Mild swelling to right ankle and foot, mainly lateral. Tenderness to anterior, posterior, and lateral right ankle into lateral malleolus. Tenderness along lateral foot to MTP as well as along dorsal midfoot, not extending to medial foot or into toes. Decreased ROM due to pain.  Skin:    Findings: No bruising or rash.  Neurological:     Mental Status: She is alert.     Gait: Gait abnormal (antalgic favoring right).      UC Treatments / Results  Labs (all labs ordered are listed, but only abnormal results are displayed) Labs Reviewed - No data to display  EKG   Radiology DG Ankle Complete Right  Result Date: 08/23/2020 CLINICAL DATA:  Fall while wearing high heels. Rolled ankle. Ankle pain and swelling. Initial encounter. EXAM: RIGHT ANKLE - COMPLETE 3+ VIEW COMPARISON:  None. FINDINGS: There is no evidence of fracture, dislocation, or joint effusion. There is no evidence of arthropathy or other focal bone abnormality. Soft tissues are unremarkable. IMPRESSION: Negative. Electronically Signed   By: Marlaine Hind M.D.   On: 08/23/2020 16:23   DG  Foot Complete Right  Result Date: 08/23/2020 CLINICAL DATA:  Fall while wearing high heels in grass. Right foot pain. Initial encounter. EXAM: RIGHT FOOT COMPLETE - 3+ VIEW COMPARISON:  None. FINDINGS: There is no evidence of fracture or dislocation. There is no evidence of arthropathy. Small dorsal and plantar calcaneal bone spurs are noted. Soft tissues are unremarkable. IMPRESSION: No acute findings. Small calcaneal bone spurs. Electronically Signed   By: Marlaine Hind M.D.   On: 08/23/2020 16:25    Procedures Procedures (including critical care time)  Medications Ordered in UC Medications - No data to display  Initial Impression / Assessment and Plan / UC Course  I have reviewed the triage vital signs and the nursing notes.  Pertinent labs & imaging results that were available during my care of the patient were reviewed by me and considered in my medical decision making (see chart for details).     No fx. Ankle sprain standard tx of brace, RICE, and early ROM.   E/M: 1 acute uncomplicated illness, 2 data(ankle and foot xrays), low risk   Final Clinical Impressions(s) / UC Diagnoses   Final diagnoses:  Sprain of right ankle, unspecified ligament, initial encounter     Discharge Instructions     No fracture on x-rays. Rest, ice, elevate. Take OTC medicine as needed for discomfort. Wear brace provided - recommend about a week then gradually return to normal use. Early movement can help with healing - do  daily range of motion exercises.     ED Prescriptions    None     PDMP not reviewed this encounter.   Delsa Sale, [place] 08/23/20 [phone]

## 2020-08-23 NOTE — ED Triage Notes (Addendum)
Pt presents to Urgent Care with c/o R ankle and R lateral foot pain following injury 2 days ago. Pt reports she stumbled between concrete and grass while wearing high heels, and she twisted her R ankle. Difficult to ambulate.

## 2020-08-24 LAB — HM COLONOSCOPY

## 2020-09-02 ENCOUNTER — Other Ambulatory Visit: Payer: Self-pay

## 2020-09-02 ENCOUNTER — Other Ambulatory Visit: Payer: Self-pay | Admitting: Sports Medicine

## 2020-09-02 ENCOUNTER — Ambulatory Visit (INDEPENDENT_AMBULATORY_CARE_PROVIDER_SITE_OTHER): Payer: BC Managed Care – PPO

## 2020-09-02 ENCOUNTER — Ambulatory Visit: Payer: BC Managed Care – PPO | Admitting: Sports Medicine

## 2020-09-02 DIAGNOSIS — G5603 Carpal tunnel syndrome, bilateral upper limbs: Secondary | ICD-10-CM

## 2020-09-02 DIAGNOSIS — M659 Synovitis and tenosynovitis, unspecified: Secondary | ICD-10-CM | POA: Diagnosis not present

## 2020-09-02 DIAGNOSIS — M65941 Unspecified synovitis and tenosynovitis, right hand: Secondary | ICD-10-CM

## 2020-09-02 DIAGNOSIS — S99921A Unspecified injury of right foot, initial encounter: Secondary | ICD-10-CM

## 2020-09-02 DIAGNOSIS — M5416 Radiculopathy, lumbar region: Secondary | ICD-10-CM | POA: Diagnosis not present

## 2020-09-02 DIAGNOSIS — S90121A Contusion of right lesser toe(s) without damage to nail, initial encounter: Secondary | ICD-10-CM | POA: Diagnosis not present

## 2020-09-02 MED ORDER — TRAMADOL HCL 50 MG PO TABS: 50.0000 mg | ORAL_TABLET | Freq: Three times a day (TID) | ORAL | 0 refills | Status: DC | PRN

## 2020-09-02 MED ORDER — GABAPENTIN 800 MG PO TABS: ORAL_TABLET | ORAL | 3 refills | Status: DC

## 2020-09-02 NOTE — Assessment & Plan Note (Signed)
Dropped a walker on right second toe, there is pain at the MTP as well as the DIP. Getting some x-rays, I did buddy tape the second and third toes. Tramadol for pain.

## 2020-09-02 NOTE — Assessment & Plan Note (Addendum)
Increasing swelling and pain to the right second PIP, this was last injected in October of last year. Rheumatoid testing was negative with the exception of an elevated CRP, ESR was normal. Likely osteoarthritis, we will treat the carpal tunnel on the side before proceeding with another right second PIP injection.  This is a chronic process with exacerbation and pharmacologic management.

## 2020-09-02 NOTE — Progress Notes (Signed)
    Procedures performed today:    None.  Independent interpretation of notes and tests performed by another provider:   None.  Brief History, Exam, Impression, and Recommendations:    Carpal tunnel syndrome, bilateral Continued numbness and tingling in the right hand, left hand paresthesias resolved with stretches. Positive Tinel's sign today. She does tend to sleep with her wrist in flexion, we will add nighttime splinting and follow-up in a month for hydrodissection if no better on the right.  Injury of second toe of right foot Dropped a walker on right second toe, there is pain at the MTP as well as the DIP. Getting some x-rays, I did buddy tape the second and third toes. Tramadol for pain.  Synovitis of right hand Increasing swelling and pain to the right second PIP, this was last injected in October of last year. Rheumatoid testing was negative with the exception of an elevated CRP, ESR was normal. Likely osteoarthritis, we will treat the carpal tunnel on the side before proceeding with another right second PIP injection.  This is a chronic process with exacerbation and pharmacologic management.    ___________________________________________ Gwen Her. Dianah Field, M.D., ABFM., CAQSM. Primary Care and [place] Instructor of [place] [employer] [facility] [employer]

## 2020-09-02 NOTE — Assessment & Plan Note (Signed)
Continued numbness and tingling in the right hand, left hand paresthesias resolved with stretches. Positive Tinel's sign today. She does tend to sleep with her wrist in flexion, we will add nighttime splinting and follow-up in a month for hydrodissection if no better on the right.

## 2020-09-15 ENCOUNTER — Other Ambulatory Visit: Payer: Self-pay | Admitting: Sports Medicine

## 2020-09-30 ENCOUNTER — Ambulatory Visit: Payer: BC Managed Care – PPO | Admitting: Sports Medicine

## 2020-09-30 ENCOUNTER — Ambulatory Visit (INDEPENDENT_AMBULATORY_CARE_PROVIDER_SITE_OTHER): Payer: BC Managed Care – PPO

## 2020-09-30 DIAGNOSIS — M659 Synovitis and tenosynovitis, unspecified: Secondary | ICD-10-CM | POA: Diagnosis not present

## 2020-09-30 DIAGNOSIS — R300 Dysuria: Secondary | ICD-10-CM

## 2020-09-30 DIAGNOSIS — N3000 Acute cystitis without hematuria: Secondary | ICD-10-CM | POA: Insufficient documentation

## 2020-09-30 DIAGNOSIS — G5603 Carpal tunnel syndrome, bilateral upper limbs: Secondary | ICD-10-CM

## 2020-09-30 DIAGNOSIS — N3001 Acute cystitis with hematuria: Secondary | ICD-10-CM

## 2020-09-30 DIAGNOSIS — M65941 Unspecified synovitis and tenosynovitis, right hand: Secondary | ICD-10-CM

## 2020-09-30 LAB — POCT URINALYSIS DIP (CLINITEK)
Bilirubin, UA: NEGATIVE
Glucose, UA: NEGATIVE mg/dL
Ketones, POC UA: NEGATIVE mg/dL
Nitrite, UA: NEGATIVE
POC PROTEIN,UA: 30 — AB
Spec Grav, UA: 1.025 (ref 1.010–1.025)
Urobilinogen, UA: 0.2 E.U./dL
pH, UA: 6 (ref 5.0–8.0)

## 2020-09-30 MED ORDER — LEVOFLOXACIN 500 MG PO TABS: 500.0000 mg | ORAL_TABLET | Freq: Every day | ORAL | 0 refills | Status: DC

## 2020-09-30 MED ORDER — CELECOXIB 200 MG PO CAPS: ORAL_CAPSULE | ORAL | 2 refills | Status: DC

## 2020-09-30 NOTE — Assessment & Plan Note (Addendum)
Acute cystitis, burning, urgency, frequency, mild hematuria and leukocyte esterase on urinalysis. She was educated on wiping front to back, she also did not know to avoid intercourse going from a posterior approach to an anterior approach, she will keep this in mind for further relations. I did review prior urine cultures, in 2018 she had a urine culture that grew E. coli and Enterococcus both sensitive to Levaquin so that so we will use today, we will keep an eye out for her urine culture results for this urinalysis.  Urine culture growing out E. coli, intermediate to Levaquin but good sensitivity to ciprofloxacin so we will switch her.

## 2020-09-30 NOTE — Progress Notes (Addendum)
    Procedures performed today:    Procedure: Real-time Ultrasound Guided hydrodissection of the right median nerve at the carpal tunnel Device: Samsung [id number] Verbal informed consent obtained.  Time-out conducted.  Noted no overlying erythema, induration, or other signs of local infection.  Skin prepped in a sterile fashion.  Local anesthesia: Topical Ethyl chloride.  With sterile technique and under real time ultrasound guidance: Noted enlarged median nerve, using a 25-gauge needle advanced into the carpal tunnel, taking care to avoid intraneural injection I injected medication both superficial to and deep to the median nerve freeing it from surrounding structures, I then redirected the needle deep and injected further medication around the flexor tendons deep within the carpal tunnel for a total of 1 cc kenalog 40, 5 cc 1% lidocaine without epinephrine. Completed without difficulty  Advised to call if fevers/chills, erythema, induration, drainage, or persistent bleeding.  Images permanently stored and available for review in PACS.  Impression: Technically successful ultrasound guided median nerve hydrodissection.  Independent interpretation of notes and tests performed by another provider:   None.  Brief History, Exam, Impression, and Recommendations:    Carpal tunnel syndrome, bilateral This is a very pleasant 49-year-old female, she had continued numbness and tingling in the right hand, left hand paresthesias resolved with stretches, showed a positive Tinel sign on exam, we added nighttime splinting, unfortunately she continues to have symptoms so we did a median nerve Hydro dissection with ultrasound guidance today, return to see me in a month.  Synovitis of right hand Increased right second PIP swelling, last injected at the beginning of this month. Continued pain, she had negative rheumatoid testing with the exception of a minimally elevated CRP, ESR was normal. Switching from  ibuprofen to Celebrex. She is currently underdosing with ibuprofen and higher doses tend to give her dyspepsia. We can follow this up in a month.  Acute cystitis Acute cystitis, burning, urgency, frequency, mild hematuria and leukocyte esterase on urinalysis. She was educated on wiping front to back, she also did not know to avoid intercourse going from a posterior approach to an anterior approach, she will keep this in mind for further relations. I did review prior urine cultures, in 2018 she had a urine culture that grew E. coli and Enterococcus both sensitive to Levaquin so that so we will use today, we will keep an eye out for her urine culture results for this urinalysis.  Urine culture growing out E. coli, intermediate to Levaquin but good sensitivity to ciprofloxacin so we will switch her.    ___________________________________________ Thomas J. Thekkekandam, M.D., ABFM., CAQSM. Primary Care and Sports Medicine [facility]  Adjunct Instructor of Family Medicine  [employer]  

## 2020-09-30 NOTE — Assessment & Plan Note (Signed)
This is a very pleasant 50 year old female, she had continued numbness and tingling in the right hand, left hand paresthesias resolved with stretches, showed a positive Tinel sign on exam, we added nighttime splinting, unfortunately she continues to have symptoms so we did a median nerve Hydro dissection with ultrasound guidance today, return to see me in a month.

## 2020-09-30 NOTE — Assessment & Plan Note (Signed)
Increased right second PIP swelling, last injected at the beginning of this month. Continued pain, she had negative rheumatoid testing with the exception of a minimally elevated CRP, ESR was normal. Switching from ibuprofen to Celebrex. She is currently underdosing with ibuprofen and higher doses tend to give her dyspepsia. We can follow this up in a month.

## 2020-10-02 LAB — URINE CULTURE
MICRO NUMBER:: 12064858
SPECIMEN QUALITY:: ADEQUATE

## 2020-10-02 MED ORDER — CIPROFLOXACIN HCL 750 MG PO TABS: 750.0000 mg | ORAL_TABLET | Freq: Two times a day (BID) | ORAL | 0 refills | Status: AC

## 2020-10-02 NOTE — Addendum Note (Signed)
Addended by: Silverio Decamp on: 10/02/2020 03:35 PM   Modules accepted: Orders

## 2020-10-28 ENCOUNTER — Other Ambulatory Visit: Payer: Self-pay

## 2020-10-28 ENCOUNTER — Encounter: Payer: Self-pay | Admitting: Family Medicine

## 2020-10-28 ENCOUNTER — Ambulatory Visit: Payer: BC Managed Care – PPO | Admitting: Sports Medicine

## 2020-10-28 ENCOUNTER — Ambulatory Visit: Payer: BC Managed Care – PPO | Admitting: Family Medicine

## 2020-10-28 DIAGNOSIS — M659 Synovitis and tenosynovitis, unspecified: Secondary | ICD-10-CM

## 2020-10-28 DIAGNOSIS — N3000 Acute cystitis without hematuria: Secondary | ICD-10-CM

## 2020-10-28 DIAGNOSIS — G5603 Carpal tunnel syndrome, bilateral upper limbs: Secondary | ICD-10-CM | POA: Diagnosis not present

## 2020-10-28 DIAGNOSIS — M5416 Radiculopathy, lumbar region: Secondary | ICD-10-CM | POA: Diagnosis not present

## 2020-10-28 DIAGNOSIS — M65941 Unspecified synovitis and tenosynovitis, right hand: Secondary | ICD-10-CM

## 2020-10-28 DIAGNOSIS — R300 Dysuria: Secondary | ICD-10-CM | POA: Diagnosis not present

## 2020-10-28 LAB — POCT URINALYSIS DIP (CLINITEK)
Bilirubin, UA: NEGATIVE
Glucose, UA: NEGATIVE mg/dL
Ketones, POC UA: NEGATIVE mg/dL
Leukocytes, UA: NEGATIVE
Nitrite, UA: POSITIVE — AB
POC PROTEIN,UA: NEGATIVE
Spec Grav, UA: 1.015 (ref 1.010–1.025)
Urobilinogen, UA: 0.2 E.U./dL
pH, UA: 6.5 (ref 5.0–8.0)

## 2020-10-28 MED ORDER — SULFAMETHOXAZOLE-TRIMETHOPRIM 800-160 MG PO TABS: 1.0000 | ORAL_TABLET | Freq: Two times a day (BID) | ORAL | 0 refills | Status: DC

## 2020-10-28 NOTE — Progress Notes (Signed)
Acute Office Visit  Subjective:    Patient ID: Elizabeth Copeland, female    DOB: [birth year], 50 y.o.   MRN: [id number]  Chief Complaint  Patient presents with   Dysuria     HPI Patient is in today for Dysuria x 2 mo.  She had similar sx about a month ago. Says when she gets her period her sxs start. No blood in the urine but she has been bleeding x 2 weeks.  She is getting in w/ her gyn soon.  She had e coli a month ago. No fever.    Past Medical History:  Diagnosis Date   Anemia    Back pain    Complication of anesthesia    patient has not received anesthesia before but states father and brother have a hard time waking up from anesthesia   Family history of adverse reaction to anesthesia    mother "has a hard time waking up and moody"    Fibroid    Headache    Hypertension    Morbid obesity ([place])    Pneumonia     Past Surgical History:  Procedure Laterality Date   BREAST BIOPSY Right    benign   CESAREAN SECTION  2000   DILATION AND CURETTAGE OF UTERUS N/A 09/01/2015   Procedure: DILATATION AND CURETTAGE;  Surgeon: Emily Filbert, MD;  Location: [facility];  Service: Gynecology;  Laterality: N/A;   IR GENERIC HISTORICAL  11/18/2015   IR US GUIDE VASC ACCESS RIGHT 11/18/2015 [facility]   IR GENERIC HISTORICAL  11/18/2015   IR ANGIOGRAM SELECTIVE EACH ADDITIONAL VESSEL 11/18/2015 [facility]   IR GENERIC HISTORICAL  11/18/2015   IR EMBO TUMOR ORGAN ISCHEMIA INFARCT INC GUIDE ROADMAPPING 11/18/2015 [facility]   IR GENERIC HISTORICAL  11/18/2015   IR ANGIOGRAM PELVIS SELECTIVE OR SUPRASELECTIVE 11/18/2015 [facility]   IR GENERIC HISTORICAL  11/18/2015   IR ANGIOGRAM SELECTIVE EACH ADDITIONAL VESSEL 11/18/2015 [facility]   IR GENERIC HISTORICAL  11/18/2015   IR ANGIOGRAM PELVIS SELECTIVE OR SUPRASELECTIVE 11/18/2015 [facility]   IR GENERIC HISTORICAL  02/23/2016   IR RADIOLOGIST EVAL & MGMT 02/23/2016 Jacqulynn Cadet, MD [facility]   IR GENERIC HISTORICAL   12/02/2015   IR RADIOLOGIST EVAL & MGMT 12/02/2015 Greggory Keen, MD [facility]   IR RADIOLOGIST EVAL & MGMT  08/24/2016   WISDOM TOOTH EXTRACTION      Family History  Problem Relation Age of Onset   Hypertension Other    Diabetes Father    Cancer Mother    Hypertension Mother     Social History   Socioeconomic History   Marital status: Married    Spouse name: Fara Olden   Number of children: 1   Years of education: 2 yr coll   Highest education level: Not on file  Occupational History   Occupation: member Therapist, occupational    Comment: [employer]  Tobacco Use   Smoking status: Former    Packs/day: 0.50    Years: 15.00    Pack years: 7.50    Types: Cigarettes    Quit date: 04/20/2002    Years since quitting: 18.5   Smokeless tobacco: Never  Vaping Use   Vaping Use: Never used  Substance and Sexual Activity   Alcohol use: Yes    Comment: occ   Drug use: No   Sexual activity: Not on file  Other Topics Concern   Not on file  Social History Narrative  No regular exercise.  Lives with her mother [name] and father Kmya Burghardt   Social Determinants of Health   Financial Resource Strain: Not on file  Food Insecurity: Not on file  Transportation Needs: Not on file  Physical Activity: Not on file  Stress: Not on file  Social Connections: Not on file  Intimate Partner Violence: Not on file    Outpatient Medications Prior to Visit  Medication Sig Dispense Refill   celecoxib (CELEBREX) 200 MG capsule One to 2 tablets by mouth daily as needed for pain. 60 capsule 2   escitalopram (LEXAPRO) 10 MG tablet Take 1 tablet (10 mg total) by mouth daily. 30 tablet 1   gabapentin (NEURONTIN) 800 MG tablet TAKE 1 TABLET BY MOUTH IN THE MORNING, 1 TABLET IN THE MIDDAY, 2 TABLETS IN THE EVENING 360 tablet 3   lisinopril-hydrochlorothiazide (ZESTORETIC) 20-12.5 MG tablet TAKE 1 TABLET BY MOUTH DAILY 90 tablet 2   traMADol (ULTRAM) 50 MG tablet Take 1-2 tablets  (50-100 mg total) by mouth every 8 (eight) hours as needed for moderate pain. Maximum 6 tabs per day. 30 tablet 0   No facility-administered medications prior to visit.    No Known Allergies  Review of Systems     Objective:    Physical Exam Vitals reviewed.  Constitutional:      Appearance: She is well-developed.  HENT:     Head: Normocephalic and atraumatic.  Eyes:     Conjunctiva/sclera: Conjunctivae normal.  Cardiovascular:     Rate and Rhythm: Normal rate.  Pulmonary:     Effort: Pulmonary effort is normal.  Skin:    General: Skin is dry.     Coloration: Skin is not pale.  Neurological:     Mental Status: She is alert and oriented to person, place, and time.  Psychiatric:        Behavior: Behavior normal.    There were no vitals taken for this visit. Wt Readings from Last 3 Encounters:  08/23/20 230 lb (104.3 kg)  07/23/20 231 lb (104.8 kg)  06/24/20 231 lb (104.8 kg)    Health Maintenance Due  Topic Date Due   Zoster Vaccines- Shingrix (2 of 2) 08/21/2019   COVID-19 Vaccine (3 - Booster for Moderna series) 05/19/2020    There are no preventive care reminders to display for this patient.   Lab Results  Component Value Date   TSH 1.89 06/26/2019   Lab Results  Component Value Date   WBC 10.3 06/24/2020   HGB 14.4 06/24/2020   HCT 41.8 06/24/2020   MCV 83.8 06/24/2020   PLT 341 06/24/2020   Lab Results  Component Value Date   NA 140 06/24/2020   K 3.6 06/24/2020   CO2 28 06/24/2020   GLUCOSE 87 06/24/2020   BUN 14 06/24/2020   CREATININE 0.77 06/24/2020   BILITOT 0.4 06/24/2020   ALKPHOS 62 08/31/2016   AST 18 06/24/2020   ALT 15 06/24/2020   PROT 6.9 06/24/2020   ALBUMIN 4.2 08/31/2016   CALCIUM 9.2 06/24/2020   ANIONGAP 7 11/18/2015   Lab Results  Component Value Date   CHOL 229 (H) 06/24/2020   Lab Results  Component Value Date   HDL 32 (L) 06/24/2020   Lab Results  Component Value Date   LDLCALC 137 (H) 06/24/2020   Lab  Results  Component Value Date   TRIG 392 (H) 06/24/2020   Lab Results  Component Value Date   CHOLHDL 7.2 (H) 06/24/2020   Lab  Results  Component Value Date   HGBA1C 5.5 12/14/2016       Assessment & Plan:   Problem List Items Addressed This Visit       Genitourinary   Acute cystitis   Relevant Medications   sulfamethoxazole-trimethoprim (BACTRIM DS) 800-160 MG tablet   Other Visit Diagnoses     Dysuria    -  Primary   Relevant Orders   POCT URINALYSIS DIP (CLINITEK) (Completed)   Urine Culture      UTI - last culture was E coli.  Will tx with Bactrim DS based on that results. Will send for culture today.  Seems to be corruing during her periods. 2 UTIs in short period fo time.  Also discussed that wearing pads for prolonged periods can inc risk of UTI as well    Meds ordered this encounter  Medications   sulfamethoxazole-trimethoprim (BACTRIM DS) 800-160 MG tablet    Sig: Take 1 tablet by mouth 2 (two) times daily.    Dispense:  6 tablet    Refill:  0     Beatrice Lecher, MD

## 2020-10-28 NOTE — Assessment & Plan Note (Signed)
In addition to the above she had some right second PIP swelling, injected in the beginning of June with out significant improvement. She did have a negative rheumatoid testing with exception of a minimally elevated CRP, ESR was normal. Celebrex seems to help so she will continue this, higher doses of ibuprofen had given her dyspepsia. I would like her to discuss this with Dr. Amedeo Plenty as well.

## 2020-10-28 NOTE — Progress Notes (Signed)
    Procedures performed today:    None.  Independent interpretation of notes and tests performed by another provider:   None.  Brief History, Exam, Impression, and Recommendations:    Carpal tunnel syndrome, bilateral This is a pleasant 50 year old female, we have been treating her for bilateral carpal tunnel syndrome, her left-sided hand paresthesias resolved with stretches and splinting, right-sided continued so we did a median nerve Hydro dissection injection of the last visit, she returns today with persistent numbness and tingling in the fingertips, as well as dropping objects. At this juncture I do think she needs consultation with Dr. Amedeo Plenty.  Synovitis of right hand In addition to the above she had some right second PIP swelling, injected in the beginning of June with out significant improvement. She did have a negative rheumatoid testing with exception of a minimally elevated CRP, ESR was normal. Celebrex seems to help so she will continue this, higher doses of ibuprofen had given her dyspepsia. I would like her to discuss this with Dr. Amedeo Plenty as well.    ___________________________________________ Gwen Her. Dianah Field, M.D., ABFM., CAQSM. Primary Care and [place] Instructor of [place] [facility] of Medicine

## 2020-10-28 NOTE — Assessment & Plan Note (Signed)
This is a pleasant 50 year old female, we have been treating her for bilateral carpal tunnel syndrome, her left-sided hand paresthesias resolved with stretches and splinting, right-sided continued so we did a median nerve Hydro dissection injection of the last visit, she returns today with persistent numbness and tingling in the fingertips, as well as dropping objects. At this juncture I do think she needs consultation with Dr. Amedeo Plenty.

## 2020-10-30 LAB — URINE CULTURE
MICRO NUMBER:: 12171903
SPECIMEN QUALITY:: ADEQUATE

## 2020-11-08 ENCOUNTER — Encounter: Payer: Self-pay | Admitting: Family Medicine

## 2020-11-09 ENCOUNTER — Encounter: Payer: Self-pay | Admitting: Family Medicine

## 2020-11-09 ENCOUNTER — Telehealth (INDEPENDENT_AMBULATORY_CARE_PROVIDER_SITE_OTHER): Payer: BC Managed Care – PPO | Admitting: Family Medicine

## 2020-11-09 DIAGNOSIS — R110 Nausea: Secondary | ICD-10-CM | POA: Diagnosis not present

## 2020-11-09 DIAGNOSIS — R42 Dizziness and giddiness: Secondary | ICD-10-CM

## 2020-11-09 MED ORDER — ONDANSETRON 4 MG PO TBDP: 4.0000 mg | ORAL_TABLET | Freq: Three times a day (TID) | ORAL | 0 refills | Status: DC | PRN

## 2020-11-09 MED ORDER — MECLIZINE HCL 25 MG PO TABS: 25.0000 mg | ORAL_TABLET | Freq: Three times a day (TID) | ORAL | 0 refills | Status: DC | PRN

## 2020-11-09 NOTE — Telephone Encounter (Signed)
Patient scheduled.

## 2020-11-09 NOTE — Progress Notes (Signed)
Virtual Video Visit via MyChart Note  I connected with  [name] on 11/09/20 at  8:10 AM EDT by the video enabled telemedicine application for MyChart, and verified that I am speaking with the correct person using two identifiers.   I introduced myself as a Designer, jewellery with the practice. We discussed the limitations of evaluation and management by telemedicine and the availability of in person appointments. The patient expressed understanding and agreed to proceed.  Participating parties in this visit include: The patient and the nurse practitioner listed.  The patient is: At work I am: In the office - Primary [facility]  Subjective:    CC:  Chief Complaint  Patient presents with   Dizziness    HPI: Elizabeth Copeland is a 50 y.o. year old female presenting today via [place] today for dizziness.  Last night, patient woke up with "bad vertigo" and nausea. She reports the room was spinning and she had to be very still for it to pass. She states that bending over or turning her head to the left tends to be the main triggers for spinning that she has noticed. States the nausea seems to be improving, but the spinning continues with those certain movements. She reports she was diagnosed with BPPV in the past, and this feels the same way. She did remember some of the exercises she was taught and tried them last night which she thinks may have helped some. She denies any recent illness, but states both of her ears have felt full for the past few weeks. She does have history of seasonal allergies, but only takes Zyrtec PRN. She denies any pre-syncope, chest pain, shortness of breath, vision changes, headache, vomiting.    Past medical history, Surgical history, Family history not pertinant except as noted below, Social history, Allergies, and medications have been entered into the medical record, reviewed, and corrections made.   Review of Systems:  All review of systems  negative except what is listed in the HPI   Objective:    General:  Speaking clearly in complete sentences. Absent shortness of breath noted.   Alert and oriented x3.   Normal judgment.  Absent acute distress.   Impression and Recommendations:    1. Dizziness Presentation consistent with BPPV, but virtual visit so unable to fully evaluate with Marye Round. She has been diagnosed with this in the past and presentation is the same as before. Will go ahead and send meclizine PRN for relief. Possible trigger from allergies/URI as she reports bilateral ear fullness - recommend she take her Zyrtec daily for the next 2 weeks and add Flonase nasal spray as well. Discussed PT as an option as well. Patient aware of signs/symptoms requiring further/urgent evaluation.   - meclizine (ANTIVERT) 25 MG tablet; Take 1 tablet (25 mg total) by mouth 3 (three) times daily as needed for dizziness.  Dispense: 30 tablet; Refill: 0  2. Nausea Sending in some zofran to have on hand in case the meclizine is not sufficient in reducing nausea.   - ondansetron (ZOFRAN ODT) 4 MG disintegrating tablet; Take 1 tablet (4 mg total) by mouth every 8 (eight) hours as needed for nausea or vomiting.  Dispense: 20 tablet; Refill: 0     Follow-up if symptoms worsen or fail to improve.    I discussed the assessment and treatment plan with the patient. The patient was provided an opportunity to ask questions and all were answered. The patient agreed with the plan and  demonstrated an understanding of the instructions.   The patient was advised to call back or seek an in-person evaluation if the symptoms worsen or if the condition fails to improve as anticipated.  I spent 20 minutes dedicated to the care of this patient on the date of this encounter to include pre-visit chart review of prior notes and results, face-to-face time with the patient, and post-visit ordering of testing as indicated.   Purcell Nails Olevia Bowens, DNP,  FNP-C

## 2020-11-13 ENCOUNTER — Other Ambulatory Visit: Payer: Self-pay

## 2020-11-13 ENCOUNTER — Ambulatory Visit (INDEPENDENT_AMBULATORY_CARE_PROVIDER_SITE_OTHER): Payer: BC Managed Care – PPO | Admitting: Medical-Surgical

## 2020-11-13 ENCOUNTER — Encounter: Payer: Self-pay | Admitting: Medical-Surgical

## 2020-11-13 VITALS — BP 127/82 | HR 62 | Resp 20 | Wt 233.0 lb

## 2020-11-13 DIAGNOSIS — R42 Dizziness and giddiness: Secondary | ICD-10-CM | POA: Diagnosis not present

## 2020-11-13 MED ORDER — METHYLPREDNISOLONE 4 MG PO TBPK: ORAL_TABLET | ORAL | 0 refills | Status: DC

## 2020-11-13 NOTE — Progress Notes (Signed)
  HPI with pertinent ROS:   CC: dizziness  HPI: Pleasant 50 year old female presenting with complaints of vertigo. She was seen on 8.8.2022 by Caleen Jobs, NP in our office. At that time, she was felt to be dealing with BPPV and was prescribed Zyrtec, Flonase, and Meclizine. She has taken the Zyrtec daily but did not start the Flonase or the meclizine.  She has not taken the meclizine because it does make her sleepy and she cannot afford to take time off work.  Notes that her symptoms started when she got back from her vacation to [place], [place].  On Sunday, she had severe vertigo and spent most of the day in bed.  She did do the vertigo exercises that she has done in the past.  She had a virtual visit on Monday with Lovena Le and then yesterday noted that when she turns her head to the left, the dizziness is worse.  It does improve when not looking to the left.  Yesterday she found a lump on the left side of her neck and had some pain that extended over her ear and down into the back of her head and neck.  The lump has resolved today but the pain is still present.  She did have some nausea with her original dizziness but that stopped on Tuesday.  She has not had any fever, chills, chest pain, shortness of breath, vomiting, or diarrhea.  I reviewed the past medical history, family history, social history, surgical history, and allergies today and no changes were needed.  Please see the problem list section below in epic for further details.   Physical exam:   General: Well Developed, well nourished, and in no acute distress.  Neuro: Alert and oriented x3.  HEENT: Normocephalic, atraumatic, pupils equal round reactive to light, neck supple, no masses, mild left cervical lymphadenopathy, thyroid nonpalpable.  Bilateral serous middle ear effusions, left worse than right.  TMs intact without erythema. Skin: Warm and dry. Cardiac: Regular rate and rhythm, no murmurs rubs or gallops, no lower  extremity edema.  Respiratory: Clear to auscultation bilaterally. Not using accessory muscles, speaking in full sentences.  Impression and Recommendations:    1. Dizziness 2. Vertigo Suspect BPPV complicated by eustachian tube dysfunction.  Also suspect some of her left neck pain is related to cervical radiculopathy rather than her current issues with dizziness.  Strongly recommend starting Flonase as well as using meclizine as needed.  No indications of infection today so we will hold off on any antibiotics.  We did discuss possible treatments and agreed on doing a methylprednisolone Dosepak to help with the cervical radiculopathy which may also have benefits for her issues with dizziness and ear pain.  Return if symptoms worsen or fail to improve. ___________________________________________ Clearnce Sorrel, DNP, APRN, FNP-BC Primary Care and [place]

## 2020-11-18 ENCOUNTER — Encounter: Payer: Self-pay | Admitting: Obstetrics & Gynecology

## 2020-11-18 ENCOUNTER — Ambulatory Visit (INDEPENDENT_AMBULATORY_CARE_PROVIDER_SITE_OTHER): Payer: BC Managed Care – PPO | Admitting: Obstetrics & Gynecology

## 2020-11-18 ENCOUNTER — Other Ambulatory Visit: Payer: Self-pay

## 2020-11-18 ENCOUNTER — Telehealth: Payer: Self-pay | Admitting: Family Medicine

## 2020-11-18 VITALS — BP 124/80 | HR 73 | Ht 63.0 in | Wt 230.0 lb

## 2020-11-18 DIAGNOSIS — R32 Unspecified urinary incontinence: Secondary | ICD-10-CM | POA: Diagnosis not present

## 2020-11-18 DIAGNOSIS — Z01419 Encounter for gynecological examination (general) (routine) without abnormal findings: Secondary | ICD-10-CM

## 2020-11-18 DIAGNOSIS — D219 Benign neoplasm of connective and other soft tissue, unspecified: Secondary | ICD-10-CM

## 2020-11-18 DIAGNOSIS — R42 Dizziness and giddiness: Secondary | ICD-10-CM

## 2020-11-18 NOTE — Telephone Encounter (Signed)
Pt states that she is taking the meclizine in the mornings and in the evenings only because of it making her sleepy while she is working.  She also states that the steroid dosepak made her body ache all over to the point that no one could touch her without it being extremely painful so she stopped it.  Pt states that the dizziness occurs more with positional changes, turning her head too quickly, etc.  Please advise.  Charyl Bigger, CMA

## 2020-11-18 NOTE — Progress Notes (Signed)
Last pap 7/30-20- negative Mammogram up to date Pt states periods have lasted longer than usual lately

## 2020-11-18 NOTE — Telephone Encounter (Signed)
Pt called. Her vertigo is not getting any better; and its worse when she gets agitated.  Thank you.

## 2020-11-18 NOTE — Telephone Encounter (Signed)
Unfortunately, any medication used to treat vertigo has the risk of making her dizzy.  She is welcome to come in for an in person evaluation with her PCP if she would like.  That is not a usual result of a steroid Dosepak but if she does not want to continue it/finish it, that is fine.  Make sure to change positions very slowly to prevent falls and injuries.  Referral placed for vestibular rehab with physical therapy.  Clearnce Sorrel, DNP, APRN, FNP-BC [place] [facility]

## 2020-11-18 NOTE — Progress Notes (Signed)
Subjective:     Elizabeth Copeland is a 50 y.o. female here for a routine exam.  Current complaints:  LMP in July last 2 weeks and was heavy the first few days.  Bleeding has been more erratic this year.  Patient is status post uterine artery embolization in 2017.  Bleeding had improved for several years and just recently has become heavier and longer.  Patient also complaining of stress urinary incontinence and urge incontinence in the morning.   Gynecologic History Patient's last menstrual period was 11/11/2020. Contraception: abstinence Last Pap: 2020. Results were: normal Last mammogram: 2022 with biopsy--fibroadenoma.  Obstetric History OB History  Gravida Para Term Preterm AB Living  '1 1       1  '$ SAB IAB Ectopic Multiple Live Births               # Outcome Date GA Lbr Len/2nd Weight Sex Delivery Anes PTL Lv  1 Para 01/01/99 [redacted]w[redacted]d           The following portions of the patient's history were reviewed and updated as appropriate: allergies, current medications, past family history, past medical history, past social history, past surgical history, and problem list.  Review of Systems Pertinent items noted in HPI and remainder of comprehensive ROS otherwise negative.    Objective:     Vitals:   11/18/20 0909  BP: 124/80  Pulse: 73  Weight: 230 lb (104.3 kg)  Height: '5\' 3"'$  (1.6 m)   Vitals:  WNL General appearance: alert, cooperative and no distress  HEENT: Normocephalic, without obvious abnormality, atraumatic Eyes: negative Throat: lips, mucosa, and tongue normal; teeth and gums normal  Respiratory: Clear to auscultation bilaterally  CV: Regular rate and rhythm  Breasts:  Normal appearance, no masses or tenderness, no nipple retraction or dimpling  GI: Soft, non-tender; bowel sounds normal; no masses,  no organomegaly  GU: External Genitalia:  Tanner V, no lesion Urethra:  No prolapse   Vagina: Pink, normal rugae, no blood or discharge  Cervix: No CMT, no lesion   Uterus:  Normal size and contour, non tender  Adnexa: Normal, no masses, non tender  Musculoskeletal: No edema, redness or tenderness in the calves or thighs  Skin: No lesions or rash; diffusely sensitive skin.  Lymphatic: Axillary adenopathy: none     Psychiatric: Normal mood and behavior        Assessment:    Healthy female exam.    Plan:   Pap smear not due until 2023. Follow-up with mammograms as indicated by radiology Patient up-to-date on colonoscopy, per patient report. Follow-up with PCP for fibromyalgia pain Pelvic ultrasound complete with transvaginal component to evaluate uterus and bleeding.  Patient known to have an enlarged fibroid uterus and has undergone uterine artery embolization in the past. Ambulatory referral to urogynecologist for mixed incontinence symptoms.

## 2020-11-19 NOTE — Telephone Encounter (Addendum)
Spoke w/pt and she stated that all of this began when she returned home from vacation on 8/7. She said that her sxs are vertigo and she had nausea x 1 day only. She took the Meclizine and said that she is sitting in her bed and the room is spinning and she is unsteady. She is not able to work,drive. She asked if could be caused by her blood pressure? She stated that she was seen by GYN and her bp was NL   She said that behind her L ear which has since subsided.   She had been doing the BPPV exercises but she hasn't been able to get thru the entire set. She said in the beginning it was only on the L now it has moved to the R.   Pt was advised to seek care at [facility] or ED if sxs persist she voiced understanding and agreed.

## 2020-11-19 NOTE — Telephone Encounter (Signed)
LVM for pt to call to discuss.  T. Renaye Janicki, CMA  

## 2020-12-01 ENCOUNTER — Other Ambulatory Visit: Payer: BC Managed Care – PPO

## 2020-12-23 ENCOUNTER — Ambulatory Visit (INDEPENDENT_AMBULATORY_CARE_PROVIDER_SITE_OTHER): Payer: BC Managed Care – PPO

## 2020-12-23 ENCOUNTER — Other Ambulatory Visit: Payer: Self-pay

## 2020-12-23 DIAGNOSIS — D259 Leiomyoma of uterus, unspecified: Secondary | ICD-10-CM

## 2020-12-23 DIAGNOSIS — D251 Intramural leiomyoma of uterus: Secondary | ICD-10-CM | POA: Diagnosis not present

## 2020-12-23 DIAGNOSIS — D219 Benign neoplasm of connective and other soft tissue, unspecified: Secondary | ICD-10-CM

## 2020-12-23 DIAGNOSIS — D252 Subserosal leiomyoma of uterus: Secondary | ICD-10-CM | POA: Diagnosis not present

## 2020-12-23 DIAGNOSIS — N888 Other specified noninflammatory disorders of cervix uteri: Secondary | ICD-10-CM | POA: Diagnosis not present

## 2021-01-06 DIAGNOSIS — G5601 Carpal tunnel syndrome, right upper limb: Secondary | ICD-10-CM | POA: Diagnosis not present

## 2021-01-06 DIAGNOSIS — M65321 Trigger finger, right index finger: Secondary | ICD-10-CM | POA: Diagnosis not present

## 2021-01-06 DIAGNOSIS — M79642 Pain in left hand: Secondary | ICD-10-CM | POA: Diagnosis not present

## 2021-01-15 ENCOUNTER — Telehealth: Payer: Self-pay | Admitting: *Deleted

## 2021-01-15 NOTE — Telephone Encounter (Signed)
Left patient a message to call and schedule F/U appointment with Dr. Gala Romney after Uro GYN appointment on 02/03/2021.

## 2021-01-19 ENCOUNTER — Other Ambulatory Visit: Payer: Self-pay

## 2021-01-19 DIAGNOSIS — S99921A Unspecified injury of right foot, initial encounter: Secondary | ICD-10-CM

## 2021-01-19 MED ORDER — TRAMADOL HCL 50 MG PO TABS: 50.0000 mg | ORAL_TABLET | Freq: Three times a day (TID) | ORAL | 0 refills | Status: DC | PRN

## 2021-01-19 NOTE — Telephone Encounter (Signed)
Received request from [facility] for refill on Tramadol 50mg . Last refill was 09/02/2020 for 30 tabs and no refills.   If approved, order is pended.

## 2021-02-03 ENCOUNTER — Encounter: Payer: Self-pay | Admitting: Obstetrics and Gynecology

## 2021-02-03 ENCOUNTER — Other Ambulatory Visit: Payer: Self-pay

## 2021-02-03 ENCOUNTER — Ambulatory Visit: Payer: BC Managed Care – PPO | Admitting: Obstetrics and Gynecology

## 2021-02-03 VITALS — BP 133/84 | HR 86 | Wt 230.0 lb

## 2021-02-03 DIAGNOSIS — R350 Frequency of micturition: Secondary | ICD-10-CM

## 2021-02-03 DIAGNOSIS — N3281 Overactive bladder: Secondary | ICD-10-CM

## 2021-02-03 DIAGNOSIS — N393 Stress incontinence (female) (male): Secondary | ICD-10-CM

## 2021-02-03 LAB — POCT URINALYSIS DIPSTICK
Appearance: NORMAL
Bilirubin, UA: NEGATIVE
Glucose, UA: NEGATIVE
Ketones, UA: NEGATIVE
Leukocytes, UA: NEGATIVE
Nitrite, UA: NEGATIVE
Protein, UA: NEGATIVE
Spec Grav, UA: 1.025 (ref 1.010–1.025)
Urobilinogen, UA: 0.2 E.U./dL
pH, UA: 5.5 (ref 5.0–8.0)

## 2021-02-03 NOTE — Progress Notes (Signed)
[facility] New Patient Evaluation and Consultation  Referring Provider: Guss Bunde, MD PCP: Hali Marry, MD Date of Service: 02/03/2021  SUBJECTIVE Chief Complaint: New Patient (Initial Visit) Ashby Dawes CORAYMA CASHATT is a 50 y.o. female complains of incontinence. )  History of Present Illness: DARENE NAPPI is a 50 y.o. White or Caucasian female seen in consultation at the request of Dr. Gala Romney for evaluation of incontinence.    Review of records from Dr Gala Romney significant for: S/p uterine artery embolization in 2017.  Having urinary leakage- stress normally but urgency also in the morning.   Urinary Symptoms: Leaks urine with cough/ sneeze, laughing, lifting, and with urgency. SUI > UUI Leaks "a lot" Pad use: 2 liners/ mini-pads per day.   She is bothered by her UI symptoms.  Day time voids 8+.  Nocturia: 2 times per night to void. Voiding dysfunction: she empties her bladder well.  does not use a catheter to empty bladder.  When urinating, she feels dribbling after finishing Drinks: 1 cup coffee, water  UTIs: 2 UTI's in the last year.   Denies history of blood in urine and kidney or bladder stones  Pelvic Organ Prolapse Symptoms:                  She Denies a feeling of a bulge the vaginal area.   Bowel Symptom: Bowel movements: 2 time(s) per day Stool consistency: soft  or loose Straining: yes, sometimes Splinting: no.  Incomplete evacuation: yes, sometimes She Denies accidental bowel leakage / fecal incontinence Bowel regimen: none   Sexual Function Sexually active: no.    Pelvic Pain Denies pelvic pain   Past Medical History:  Past Medical History:  Diagnosis Date   Anemia    Back pain    Complication of anesthesia    patient has not received anesthesia before but states father and brother have a hard time waking up from anesthesia   Family history of adverse reaction to anesthesia    mother "has a hard time waking up and  moody"    Fibroid    Headache    Hypertension    Morbid obesity ([place])    Pneumonia      Past Surgical History:   Past Surgical History:  Procedure Laterality Date   BREAST BIOPSY Right    benign   CESAREAN SECTION  2000   DILATION AND CURETTAGE OF UTERUS N/A 09/01/2015   Procedure: DILATATION AND CURETTAGE;  Surgeon: Emily Filbert, MD;  Location: [facility];  Service: Gynecology;  Laterality: N/A;   IR GENERIC HISTORICAL  11/18/2015   IR US GUIDE VASC ACCESS RIGHT 11/18/2015 [facility]   IR GENERIC HISTORICAL  11/18/2015   IR ANGIOGRAM SELECTIVE EACH ADDITIONAL VESSEL 11/18/2015 [facility]   IR GENERIC HISTORICAL  11/18/2015   IR EMBO TUMOR ORGAN ISCHEMIA INFARCT INC GUIDE ROADMAPPING 11/18/2015 [facility]   IR GENERIC HISTORICAL  11/18/2015   IR ANGIOGRAM PELVIS SELECTIVE OR SUPRASELECTIVE 11/18/2015 [facility]   IR GENERIC HISTORICAL  11/18/2015   IR ANGIOGRAM SELECTIVE EACH ADDITIONAL VESSEL 11/18/2015 [facility]   IR GENERIC HISTORICAL  11/18/2015   IR ANGIOGRAM PELVIS SELECTIVE OR SUPRASELECTIVE 11/18/2015 [facility]   IR GENERIC HISTORICAL  02/23/2016   IR RADIOLOGIST EVAL & MGMT 02/23/2016 Jacqulynn Cadet, MD [facility]   IR GENERIC HISTORICAL  12/02/2015   IR RADIOLOGIST EVAL & MGMT 12/02/2015 Greggory Keen, MD [facility]   IR RADIOLOGIST EVAL & MGMT  08/24/2016   WISDOM TOOTH EXTRACTION       Past OB/GYN History: OB History  Gravida Para Term Preterm AB Living  1 1       1   SAB IAB Ectopic Multiple Live Births               # Outcome Date GA Lbr Len/2nd Weight Sex Delivery Anes PTL Lv  1 Para 01/01/99 [redacted]w[redacted]d           Vaginal deliveries: 0,  Forceps/ Vacuum deliveries: 0, Cesarean section: 1 Has regular periods.  Last pap smear was 2022.  Any history of abnormal pap smears: no.   Medications: She has a current medication list which includes the following prescription(s): celecoxib, escitalopram, gabapentin, lisinopril-hydrochlorothiazide,  meclizine, ondansetron, and tramadol.   Allergies: Patient has No Known Allergies.   Social History:  Social History   Tobacco Use   Smoking status: Former    Packs/day: 0.50    Years: 15.00    Pack years: 7.50    Types: Cigarettes    Quit date: 04/20/2002    Years since quitting: 18.8   Smokeless tobacco: Never  Vaping Use   Vaping Use: Never used  Substance Use Topics   Alcohol use: Yes    Comment: occ   Drug use: No    Relationship status: single She lives with father.   She is employed as an Psychologist, occupational. Regular exercise: No   Family History:   Family History  Problem Relation Age of Onset   Lung cancer Mother    Hypertension Mother    Diabetes Father    Melanoma Father    Diabetes Brother    Obesity Brother    Hypertension Other      Review of Systems: Review of Systems  Constitutional:  Negative for fever, malaise/fatigue and weight loss.  Respiratory:  Negative for cough, shortness of breath and wheezing.   Cardiovascular:  Negative for chest pain, palpitations and leg swelling.  Gastrointestinal:  Negative for abdominal pain and blood in stool.  Genitourinary:  Negative for dysuria.  Musculoskeletal:  Negative for myalgias.  Skin:  Negative for rash.  Neurological:  Negative for dizziness and headaches.  Endo/Heme/Allergies:  Does not bruise/bleed easily.  Psychiatric/Behavioral:  Negative for depression. The patient is not nervous/anxious.     OBJECTIVE Physical Exam: Vitals:   02/03/21 1109  BP: 133/84  Pulse: 86  Weight: 230 lb (104.3 kg)    Physical Exam Constitutional:      General: She is not in acute distress. Pulmonary:     Effort: Pulmonary effort is normal.  Abdominal:     General: There is no distension.     Palpations: Abdomen is soft.     Tenderness: There is no abdominal tenderness. There is no rebound.  Musculoskeletal:        General: No swelling. Normal range of motion.  Skin:    General: Skin is warm  and dry.     Findings: No rash.  Neurological:     Mental Status: She is alert and oriented to person, place, and time.  Psychiatric:        Mood and Affect: Mood normal.        Behavior: Behavior normal.     GU / Detailed Urogynecologic Evaluation:  Pelvic Exam: Normal external female genitalia; Bartholin's and Skene's glands normal in appearance; urethral meatus normal in appearance, no urethral masses or discharge.   CST: negative  Speculum exam reveals normal vaginal  mucosa without atrophy. Cervix normal appearance, light blood in vaginal vault.  Uterus normal single, nontender. Adnexa no mass, fullness, tenderness.      Pelvic floor strength 0/V- unable to engage pelvic floor  Pelvic floor musculature: Right levator non-tender, Right obturator non-tender, Left levator non-tender, Left obturator non-tender  POP-Q:   POP-Q  -3                                            Aa   -3                                           Ba  -7                                              C   2                                            Gh  5                                            Pb  8                                            tvl   -3                                            Ap  -3                                            Bp  -7                                              D     Rectal Exam:  Normal external rectum  Post-Void Residual (PVR) by Bladder Scan: In order to evaluate bladder emptying, we discussed obtaining a postvoid residual and she agreed to this procedure.  Procedure: The ultrasound unit was placed on the patient's abdomen in the suprapubic region after the patient had voided. A PVR of 62 ml was obtained by bladder scan.  Laboratory Results: POC urine: + moderate blood (still having some vaginal bleeding)   ASSESSMENT AND PLAN Ms. Whitford is a 50 y.o. with:  1. SUI (stress urinary incontinence, female)   2. Urinary frequency   3. Overactive  bladder     SUI - For treatment of stress urinary incontinence,  non-surgical options include  expectant management, weight loss, physical therapy, as well as a pessary or surgical options.  - She would like to start with pelvic floor PT, referral placed.   2. OAB -We discussed the symptoms of overactive bladder (OAB), which include urinary urgency, urinary frequency, nocturia, with or without urge incontinence.  While we do not know the exact etiology of OAB, several treatment options exist. We discussed management including behavioral therapy (decreasing bladder irritants, urge suppression strategies, timed voids, bladder retraining), physical therapy, medication.  - She will start with decreasing bladder irritants (coffee) and pelvic floor PT  Return 3 months or sooner if needed  Jaquita Folds, MD   Medical Decision Making:  - Reviewed/ ordered a clinical laboratory test - Review and summation of prior records

## 2021-02-10 DIAGNOSIS — G5603 Carpal tunnel syndrome, bilateral upper limbs: Secondary | ICD-10-CM | POA: Diagnosis not present

## 2021-02-15 ENCOUNTER — Other Ambulatory Visit: Payer: Self-pay

## 2021-02-15 ENCOUNTER — Telehealth: Payer: BC Managed Care – PPO | Admitting: Obstetrics & Gynecology

## 2021-02-15 ENCOUNTER — Telehealth: Payer: Self-pay | Admitting: *Deleted

## 2021-02-15 ENCOUNTER — Encounter: Payer: Self-pay | Admitting: Obstetrics & Gynecology

## 2021-02-15 DIAGNOSIS — R32 Unspecified urinary incontinence: Secondary | ICD-10-CM | POA: Diagnosis not present

## 2021-02-15 DIAGNOSIS — D219 Benign neoplasm of connective and other soft tissue, unspecified: Secondary | ICD-10-CM | POA: Diagnosis not present

## 2021-02-15 DIAGNOSIS — G5603 Carpal tunnel syndrome, bilateral upper limbs: Secondary | ICD-10-CM | POA: Diagnosis not present

## 2021-02-15 DIAGNOSIS — M65321 Trigger finger, right index finger: Secondary | ICD-10-CM | POA: Diagnosis not present

## 2021-02-15 NOTE — Progress Notes (Signed)
GYNECOLOGY VIRTUAL VISIT ENCOUNTER NOTE  Provider location: [facility] [place]   Patient location: Home  I connected with Elizabeth Copeland on 02/15/21 at  1:50 PM EST by MyChart Video Encounter and verified that I am speaking with the correct person using two identifiers.   I discussed the limitations, risks, security and privacy concerns of performing an evaluation and management service virtually and the availability of in person appointments. I also discussed with the patient that there may be a patient responsible charge related to this service. The patient expressed understanding and agreed to proceed.   History:  Elizabeth Copeland is a 50 y.o. G1P1 female being evaluated today for follow-up from your gynecological evaluation as well as discussion of menstrual cycles..  Patient was found to have a stress and urge incontinence.  She was advised to have physical therapy and she is scheduling those appointments now.  Her ultrasound showed fibroids with a small calcified submucosal fibroid.  Her lining was 7 mm.  Details of the ultrasound can be found on the imaging tab.  Her menstrual cycles are regular coming about every 25 days she spots in the beginning has heavy flow for a few days and spots at the end.  Total bleed can be 7 to 8 days which is within the realm of normal.  If patient begins having periods 10 to 14 days or spotting in between, she will let [place] know.  Endometrial biopsy within be warranted.     Past Medical History:  Diagnosis Date   Anemia    Back pain    Complication of anesthesia    patient has not received anesthesia before but states father and brother have a hard time waking up from anesthesia   Family history of adverse reaction to anesthesia    mother "has a hard time waking up and moody"    Fibroid    Headache    Hypertension    Morbid obesity ([place])    Pneumonia    Past Surgical History:  Procedure Laterality Date   BREAST  BIOPSY Right    benign   CESAREAN SECTION  2000   DILATION AND CURETTAGE OF UTERUS N/A 09/01/2015   Procedure: DILATATION AND CURETTAGE;  Surgeon: Emily Filbert, MD;  Location: [facility];  Service: Gynecology;  Laterality: N/A;   IR GENERIC HISTORICAL  11/18/2015   IR US GUIDE VASC ACCESS RIGHT 11/18/2015 [facility]   IR GENERIC HISTORICAL  11/18/2015   IR ANGIOGRAM SELECTIVE EACH ADDITIONAL VESSEL 11/18/2015 [facility]   IR GENERIC HISTORICAL  11/18/2015   IR EMBO TUMOR ORGAN ISCHEMIA INFARCT INC GUIDE ROADMAPPING 11/18/2015 [facility]   IR GENERIC HISTORICAL  11/18/2015   IR ANGIOGRAM PELVIS SELECTIVE OR SUPRASELECTIVE 11/18/2015 [facility]   IR GENERIC HISTORICAL  11/18/2015   IR ANGIOGRAM SELECTIVE EACH ADDITIONAL VESSEL 11/18/2015 [facility]   IR GENERIC HISTORICAL  11/18/2015   IR ANGIOGRAM PELVIS SELECTIVE OR SUPRASELECTIVE 11/18/2015 [facility]   IR GENERIC HISTORICAL  02/23/2016   IR RADIOLOGIST EVAL & MGMT 02/23/2016 Jacqulynn Cadet, MD [facility]   IR GENERIC HISTORICAL  12/02/2015   IR RADIOLOGIST EVAL & MGMT 12/02/2015 Greggory Keen, MD [facility]   IR RADIOLOGIST EVAL & MGMT  08/24/2016   WISDOM TOOTH EXTRACTION     The following portions of the patient's history were reviewed and updated as appropriate: allergies, current medications, past family history, past medical history, past social history, past  surgical history and problem list.    Review of Systems:  Pertinent items noted in HPI and remainder of comprehensive ROS otherwise negative.  Physical Exam:   General:  Alert, oriented and cooperative. Patient appears to be in no acute distress.  Mental Status: Normal mood and affect. Normal behavior. Normal judgment and thought content.   Respiratory: Normal respiratory effort, no problems with respiration noted  Rest of physical exam deferred due to type of encounter  Labs and Imaging Results for orders placed or performed in visit on 02/03/21 (from  the past 336 hour(s))  POCT Urinalysis Dipstick   Collection Time: 02/03/21 11:13 AM  Result Value Ref Range   Color, UA Yellow    Clarity, UA Clear    Glucose, UA Negative Negative   Bilirubin, UA Negative    Ketones, UA Negative    Spec Grav, UA 1.025 1.010 - 1.025   Blood, UA Moderate    pH, UA 5.5 5.0 - 8.0   Protein, UA Negative Negative   Urobilinogen, UA 0.2 0.2 or 1.0 E.U./dL   Nitrite, UA Negative    Leukocytes, UA Negative Negative   Appearance Normal    Odor None    No results found.     Assessment and Plan:     1. Urinary incontinence, unspecified type Mixed stress and urge--starting PT (saw urogyn) we will follow-up with the urogynecologist as needed.  2. Fibroids Stable fibroids noted on ultrasound.  Patient will keep a menstrual track diary.  As described above, she will let us know if she bleeds longer than 10 days in a cycle, this cycle is very heavy, or has spotting between cycles.  At that point I would proceed with endometrial biopsy.  I discussed the assessment and treatment plan with the patient. The patient was provided an opportunity to ask questions and all were answered. The patient agreed with the plan and demonstrated an understanding of the instructions.   The patient was advised to call back or seek an in-person evaluation/go to the ED if the symptoms worsen or if the condition fails to improve as anticipated.  I provided 25 minutes of time with review of records, video visit, counseling, documentation of findings.   Silas Sacramento, MD [facility] [place]

## 2021-02-15 NOTE — Telephone Encounter (Signed)
Informed patient that she can pay her co-pay prior to her appointment at 1:50 PM through her MyChart.

## 2021-02-17 DIAGNOSIS — F4323 Adjustment disorder with mixed anxiety and depressed mood: Secondary | ICD-10-CM | POA: Diagnosis not present

## 2021-03-03 ENCOUNTER — Ambulatory Visit: Payer: BC Managed Care – PPO | Admitting: Physical Therapy

## 2021-03-10 DIAGNOSIS — F4323 Adjustment disorder with mixed anxiety and depressed mood: Secondary | ICD-10-CM | POA: Diagnosis not present

## 2021-03-24 DIAGNOSIS — F4323 Adjustment disorder with mixed anxiety and depressed mood: Secondary | ICD-10-CM | POA: Diagnosis not present

## 2021-04-07 DIAGNOSIS — F4323 Adjustment disorder with mixed anxiety and depressed mood: Secondary | ICD-10-CM | POA: Diagnosis not present

## 2021-04-12 ENCOUNTER — Other Ambulatory Visit: Payer: Self-pay | Admitting: Sports Medicine

## 2021-04-12 ENCOUNTER — Encounter: Payer: Self-pay | Admitting: Family Medicine

## 2021-04-12 DIAGNOSIS — I10 Essential (primary) hypertension: Secondary | ICD-10-CM

## 2021-04-12 DIAGNOSIS — S99921A Unspecified injury of right foot, initial encounter: Secondary | ICD-10-CM

## 2021-04-13 MED ORDER — TRAMADOL HCL 50 MG PO TABS: 50.0000 mg | ORAL_TABLET | Freq: Three times a day (TID) | ORAL | 0 refills | Status: DC | PRN

## 2021-04-13 MED ORDER — LISINOPRIL-HYDROCHLOROTHIAZIDE 20-12.5 MG PO TABS: 1.0000 | ORAL_TABLET | Freq: Every day | ORAL | 0 refills | Status: DC

## 2021-04-20 DIAGNOSIS — M65321 Trigger finger, right index finger: Secondary | ICD-10-CM | POA: Diagnosis not present

## 2021-04-20 DIAGNOSIS — G5601 Carpal tunnel syndrome, right upper limb: Secondary | ICD-10-CM | POA: Diagnosis not present

## 2021-04-28 ENCOUNTER — Encounter: Payer: BC Managed Care – PPO | Admitting: Family Medicine

## 2021-04-28 DIAGNOSIS — F4323 Adjustment disorder with mixed anxiety and depressed mood: Secondary | ICD-10-CM | POA: Diagnosis not present

## 2021-04-29 ENCOUNTER — Other Ambulatory Visit: Payer: Self-pay

## 2021-04-29 ENCOUNTER — Ambulatory Visit (INDEPENDENT_AMBULATORY_CARE_PROVIDER_SITE_OTHER): Payer: BC Managed Care – PPO | Admitting: Family Medicine

## 2021-04-29 ENCOUNTER — Encounter: Payer: Self-pay | Admitting: Family Medicine

## 2021-04-29 VITALS — BP 121/71 | Resp 16 | Ht 63.0 in | Wt 246.0 lb

## 2021-04-29 DIAGNOSIS — Z23 Encounter for immunization: Secondary | ICD-10-CM | POA: Diagnosis not present

## 2021-04-29 DIAGNOSIS — R21 Rash and other nonspecific skin eruption: Secondary | ICD-10-CM

## 2021-04-29 DIAGNOSIS — Z0000 Encounter for general adult medical examination without abnormal findings: Secondary | ICD-10-CM | POA: Diagnosis not present

## 2021-04-29 DIAGNOSIS — I10 Essential (primary) hypertension: Secondary | ICD-10-CM

## 2021-04-29 DIAGNOSIS — R110 Nausea: Secondary | ICD-10-CM

## 2021-04-29 MED ORDER — DULOXETINE HCL 30 MG PO CPEP: 30.0000 mg | ORAL_CAPSULE | Freq: Every day | ORAL | 3 refills | Status: DC

## 2021-04-29 MED ORDER — KETOCONAZOLE 2 % EX CREA: 1.0000 "application " | TOPICAL_CREAM | Freq: Every day | CUTANEOUS | 1 refills | Status: DC

## 2021-04-29 NOTE — Progress Notes (Signed)
Subjective:     Elizabeth Copeland is a 51 y.o. female and is here for a comprehensive physical exam. The patient reports no problems. Pap UTD with GYN.  Due for 2nd shingles vaccine.  Social History   Socioeconomic History   Marital status: Legally Separated    Spouse name: Fara Olden   Number of children: 1   Years of education: 2 yr coll   Highest education level: Not on file  Occupational History   Occupation: member Therapist, occupational    Comment: [employer]  Tobacco Use   Smoking status: Former    Packs/day: 0.50    Years: 15.00    Pack years: 7.50    Types: Cigarettes    Quit date: 04/20/2002    Years since quitting: 19.0   Smokeless tobacco: Never  Vaping Use   Vaping Use: Never used  Substance and Sexual Activity   Alcohol use: Yes    Comment: occ   Drug use: No   Sexual activity: Not Currently  Other Topics Concern   Not on file  Social History Narrative   No regular exercise. 1 cup of coffee a day   Social Determinants of Health   Financial Resource Strain: Not on file  Food Insecurity: Not on file  Transportation Needs: Not on file  Physical Activity: Not on file  Stress: Not on file  Social Connections: Not on file  Intimate Partner Violence: Not on file   Health Maintenance  Topic Date Due   COVID-19 Vaccine (3 - Booster for Moderna series) 05/15/2021 (Originally 02/12/2020)   INFLUENZA VACCINE  07/02/2021 (Originally 11/02/2020)   PAP SMEAR-Modifier  10/31/2021   MAMMOGRAM  06/18/2022   TETANUS/TDAP  02/21/2023   COLONOSCOPY (Pts 45-91yrs Insurance coverage will need to be confirmed)  08/25/2030   Hepatitis C Screening  Completed   Zoster Vaccines- Shingrix  Completed   HPV VACCINES  Aged Out   HIV Screening  Discontinued    The following portions of the patient's history were reviewed and updated as appropriate: allergies, current medications, past family history, past medical history, past social history, past surgical history, and  problem list.  Review of Systems Pertinent items are noted in HPI.   Objective:    BP 121/71    Resp 16    Ht 5\' 3"  (1.6 m)    Wt 246 lb (111.6 kg)    LMP 04/22/2021    BMI 43.58 kg/m  General appearance: alert, cooperative, and appears stated age Head: Normocephalic, without obvious abnormality, atraumatic Eyes:  conj clear, EOMI. PEER:A Ears: normal TM's and external ear canals both ears Nose: Nares normal. Septum midline. Mucosa normal. No drainage or sinus tenderness. Throat: lips, mucosa, and tongue normal; teeth and gums normal Neck: no adenopathy, no carotid bruit, no JVD, supple, symmetrical, trachea midline, and thyroid not enlarged, symmetric, no tenderness/mass/nodules Back: symmetric, no curvature. ROM normal. No CVA tenderness. Lungs: clear to auscultation bilaterally Heart: regular rate and rhythm, S1, S2 normal, no murmur, click, rub or gallop Abdomen: soft, non-tender; bowel sounds normal; no masses,  no organomegaly Extremities: extremities normal, atraumatic, no cyanosis or edema Pulses: 2+ and symmetric Skin: Skin color, texture, turgor normal. No rashes or lesions Lymph nodes: Cervical adenopathy: nl and Supraclavicular adenopathy: nl Neurologic: Grossly normal    Assessment:    Healthy female exam.      Plan:     See After Visit Summary for Counseling Recommendations  Keep up a regular exercise program and make  sure you are eating a healthy diet Try to eat 4 servings of dairy a day, or if you are lactose intolerant take a calcium with vitamin D daily.  Your vaccines are up to date.  Second shingles vaccine given today.  Essential hypertension, benign Well-controlled.  Refill medications.  Follow-up in 6 months.  Orders Placed This Encounter  Procedures   Varicella-zoster vaccine IM (Shingrix)   Lipid Panel w/reflex Direct LDL   COMPLETE METABOLIC PANEL WITH GFR   CBC   Meds ordered this encounter  Medications   ketoconazole (NIZORAL) 2 % cream     Sig: Apply 1 application topically daily.    Dispense:  30 g    Refill:  1   DULoxetine (CYMBALTA) 30 MG capsule    Sig: Take 1 capsule (30 mg total) by mouth daily.    Dispense:  30 capsule    Refill:  3   lisinopril-hydrochlorothiazide (ZESTORETIC) 20-12.5 MG tablet    Sig: Take 1 tablet by mouth daily.    Dispense:  90 tablet    Refill:  2   ondansetron (ZOFRAN ODT) 4 MG disintegrating tablet    Sig: Take 1 tablet (4 mg total) by mouth every 8 (eight) hours as needed for nausea or vomiting.    Dispense:  20 tablet    Refill:  0

## 2021-04-30 LAB — LIPID PANEL W/REFLEX DIRECT LDL
Cholesterol: 228 mg/dL — ABNORMAL HIGH (ref <200)
HDL: 38 mg/dL — ABNORMAL LOW (ref >=50)
LDL Cholesterol (Calc): 150 mg/dL (calc) — ABNORMAL HIGH
Non-HDL Cholesterol (Calc): 190 mg/dL (calc) — ABNORMAL HIGH (ref <130)
Total CHOL/HDL Ratio: 6 (calc) — ABNORMAL HIGH (ref <5.0)
Triglycerides: 236 mg/dL — ABNORMAL HIGH (ref <150)

## 2021-04-30 LAB — COMPLETE METABOLIC PANEL WITH GFR
AG Ratio: 1.5 (calc) (ref 1.0–2.5)
ALT: 27 U/L (ref 6–29)
AST: 26 U/L (ref 10–35)
Albumin: 4.1 g/dL (ref 3.6–5.1)
Alkaline phosphatase (APISO): 59 U/L (ref 37–153)
BUN: 11 mg/dL (ref 7–25)
CO2: 27 mmol/L (ref 20–32)
Calcium: 9.7 mg/dL (ref 8.6–10.4)
Chloride: 98 mmol/L (ref 98–110)
Creat: 0.81 mg/dL (ref 0.50–1.03)
Globulin: 2.7 g/dL (calc) (ref 1.9–3.7)
Glucose, Bld: 77 mg/dL (ref 65–99)
Potassium: 3.9 mmol/L (ref 3.5–5.3)
Sodium: 137 mmol/L (ref 135–146)
Total Bilirubin: 0.5 mg/dL (ref 0.2–1.2)
Total Protein: 6.8 g/dL (ref 6.1–8.1)
eGFR: 87 mL/min/{1.73_m2} (ref >=60)

## 2021-04-30 LAB — CBC
HCT: 42.4 % (ref 35.0–45.0)
Hemoglobin: 14.6 g/dL (ref 11.7–15.5)
MCH: 29.3 pg (ref 27.0–33.0)
MCHC: 34.4 g/dL (ref 32.0–36.0)
MCV: 85.1 fL (ref 80.0–100.0)
MPV: 9.9 fL (ref 7.5–12.5)
Platelets: 340 10*3/uL (ref 140–400)
RBC: 4.98 10*6/uL (ref 3.80–5.10)
RDW: 12.7 % (ref 11.0–15.0)
WBC: 11.4 10*3/uL — ABNORMAL HIGH (ref 3.8–10.8)

## 2021-04-30 MED ORDER — ONDANSETRON 4 MG PO TBDP: 4.0000 mg | ORAL_TABLET | Freq: Three times a day (TID) | ORAL | 0 refills | Status: DC | PRN

## 2021-04-30 MED ORDER — LISINOPRIL-HYDROCHLOROTHIAZIDE 20-12.5 MG PO TABS: 1.0000 | ORAL_TABLET | Freq: Every day | ORAL | 2 refills | Status: DC

## 2021-04-30 NOTE — Progress Notes (Signed)
Hi Elizabeth Copeland, triglycerides are still high but much better compared to last year.  Good work and bringing that down.  Your LDL still very elevated though.  Just encouraged to continue to work on healthy diet and regular exercise.  Blood count is okay.  No anemia.  And metabolic panel looks good.  The 10-year ASCVD risk score (Arnett DK, et al., 2019) is: 3.2%   Values used to calculate the score:     Age: 51 years     Sex: Female     Is Non-Hispanic African American: No     Diabetic: No     Tobacco smoker: No     Systolic Blood Pressure: 707 mmHg     Is BP treated: Yes     HDL Cholesterol: 38 mg/dL     Total Cholesterol: 228 mg/dL

## 2021-04-30 NOTE — Assessment & Plan Note (Addendum)
Well-controlled.  Refill medications.  Follow-up in 6 months.

## 2021-05-05 ENCOUNTER — Ambulatory Visit: Payer: BC Managed Care – PPO | Admitting: Obstetrics and Gynecology

## 2021-05-05 DIAGNOSIS — M79641 Pain in right hand: Secondary | ICD-10-CM | POA: Diagnosis not present

## 2021-05-05 DIAGNOSIS — F4323 Adjustment disorder with mixed anxiety and depressed mood: Secondary | ICD-10-CM | POA: Diagnosis not present

## 2021-05-19 DIAGNOSIS — M79641 Pain in right hand: Secondary | ICD-10-CM | POA: Diagnosis not present

## 2021-05-21 ENCOUNTER — Telehealth: Payer: Self-pay | Admitting: Family Medicine

## 2021-05-21 ENCOUNTER — Other Ambulatory Visit: Payer: Self-pay | Admitting: Family Medicine

## 2021-05-21 DIAGNOSIS — R42 Dizziness and giddiness: Secondary | ICD-10-CM

## 2021-05-21 MED ORDER — MECLIZINE HCL 25 MG PO TABS: 25.0000 mg | ORAL_TABLET | Freq: Three times a day (TID) | ORAL | 0 refills | Status: DC | PRN

## 2021-05-21 NOTE — Telephone Encounter (Signed)
Patient called and stated she is having a reaction to her medication Duloxedine. Per patient she is feeling dizzy and more nauseous than normal. Please advise.

## 2021-05-21 NOTE — Telephone Encounter (Signed)
Attempted to contact patient regarding reactions to Duloxetine. No answer, left a brief message for patient to return a call back or send a MyChart message regarding the symptoms she is having with the medication. Direct call back info provided.

## 2021-06-02 DIAGNOSIS — F4323 Adjustment disorder with mixed anxiety and depressed mood: Secondary | ICD-10-CM | POA: Diagnosis not present

## 2021-06-09 ENCOUNTER — Other Ambulatory Visit: Payer: Self-pay

## 2021-06-09 ENCOUNTER — Ambulatory Visit: Payer: BC Managed Care – PPO | Admitting: Family Medicine

## 2021-06-09 ENCOUNTER — Encounter: Payer: Self-pay | Admitting: Family Medicine

## 2021-06-09 ENCOUNTER — Ambulatory Visit: Payer: BC Managed Care – PPO | Admitting: Obstetrics and Gynecology

## 2021-06-09 VITALS — BP 134/68 | HR 89 | Ht 63.0 in | Wt 243.0 lb

## 2021-06-09 DIAGNOSIS — R42 Dizziness and giddiness: Secondary | ICD-10-CM | POA: Diagnosis not present

## 2021-06-09 DIAGNOSIS — F411 Generalized anxiety disorder: Secondary | ICD-10-CM

## 2021-06-09 DIAGNOSIS — R519 Headache, unspecified: Secondary | ICD-10-CM | POA: Diagnosis not present

## 2021-06-09 DIAGNOSIS — F4323 Adjustment disorder with mixed anxiety and depressed mood: Secondary | ICD-10-CM

## 2021-06-09 MED ORDER — FLUOXETINE HCL 10 MG PO CAPS: 10.0000 mg | ORAL_CAPSULE | Freq: Every day | ORAL | 1 refills | Status: DC

## 2021-06-09 NOTE — Progress Notes (Signed)
Established Patient Office Visit  Subjective:  Patient ID: Elizabeth Copeland, female    DOB: [birth year]  Age: 51 y.o. MRN: [id number]  CC:  Chief Complaint  Patient presents with   Anxiety   Depression    HPI Elizabeth Copeland presents for   Follow-up new start Cymbalta.  She actually called back several weeks ago and said that she was feeling dizzy and nauseated.  She quit taking the medication.  Looks like she is also tried Lexapro in the past and it was not effective.  She is really still struggling with anxiety especially with going through her divorce.  Also struggling with dizziness. Seems to be happening more frequently. Waking up with it.  She says usually within a couple hours it starts to ease off and she is able to drive to work.  The 1 day it lasted a lot longer.  She is notices it mostly when she is turning her head.  She has had some intermittent nausea but not sure that it is necessarily related to the dizziness.  She denies feeling like she is going to pass out.  She says that sometimes she will get a headache once the vertigo actually eases off.  Past Medical History:  Diagnosis Date   Anemia    Back pain    Complication of anesthesia    patient has not received anesthesia before but states father and brother have a hard time waking up from anesthesia   Family history of adverse reaction to anesthesia    mother "has a hard time waking up and moody"    Fibroid    Headache    Hypertension    Morbid obesity ([place])    Pneumonia     Past Surgical History:  Procedure Laterality Date   BREAST BIOPSY Right    benign   CARPAL TUNNEL RELEASE Right    CESAREAN SECTION  2000   DILATION AND CURETTAGE OF UTERUS N/A 09/01/2015   Procedure: DILATATION AND CURETTAGE;  Surgeon: Emily Filbert, MD;  Location: [facility];  Service: Gynecology;  Laterality: N/A;   IR GENERIC HISTORICAL  11/18/2015   IR US GUIDE VASC ACCESS RIGHT 11/18/2015 [facility]   IR GENERIC HISTORICAL   11/18/2015   IR ANGIOGRAM SELECTIVE EACH ADDITIONAL VESSEL 11/18/2015 [facility]   IR GENERIC HISTORICAL  11/18/2015   IR EMBO TUMOR ORGAN ISCHEMIA INFARCT INC GUIDE ROADMAPPING 11/18/2015 [facility]   IR GENERIC HISTORICAL  11/18/2015   IR ANGIOGRAM PELVIS SELECTIVE OR SUPRASELECTIVE 11/18/2015 [facility]   IR GENERIC HISTORICAL  11/18/2015   IR ANGIOGRAM SELECTIVE EACH ADDITIONAL VESSEL 11/18/2015 [facility]   IR GENERIC HISTORICAL  11/18/2015   IR ANGIOGRAM PELVIS SELECTIVE OR SUPRASELECTIVE 11/18/2015 [facility]   IR GENERIC HISTORICAL  02/23/2016   IR RADIOLOGIST EVAL & MGMT 02/23/2016 Jacqulynn Cadet, MD [facility]   IR GENERIC HISTORICAL  12/02/2015   IR RADIOLOGIST EVAL & MGMT 12/02/2015 Greggory Keen, MD [facility]   IR RADIOLOGIST EVAL & MGMT  08/24/2016   WISDOM TOOTH EXTRACTION      Family History  Problem Relation Age of Onset   Lung cancer Mother    Hypertension Mother    Diabetes Father    Melanoma Father    Diabetes Brother    Obesity Brother    Hypertension Other     Social History   Socioeconomic History   Marital status: Legally Separated    Spouse name: Fara Olden  Number of children: 1   Years of education: 2 yr coll   Highest education level: Not on file  Occupational History   Occupation: member service representative    Comment: [employer]  Tobacco Use   Smoking status: Former    Packs/day: 0.50    Years: 15.00    Pack years: 7.50    Types: Cigarettes    Quit date: 04/20/2002    Years since quitting: 19.1   Smokeless tobacco: Never  Vaping Use   Vaping Use: Never used  Substance and Sexual Activity   Alcohol use: Yes    Comment: occ   Drug use: No   Sexual activity: Not Currently  Other Topics Concern   Not on file  Social History Narrative   No regular exercise. 1 cup of coffee a day   Social Determinants of Health   Financial Resource Strain: Not on file  Food Insecurity: Not on file   Transportation Needs: Not on file  Physical Activity: Not on file  Stress: Not on file  Social Connections: Not on file  Intimate Partner Violence: Not on file    Outpatient Medications Prior to Visit  Medication Sig Dispense Refill   gabapentin (NEURONTIN) 800 MG tablet TAKE 1 TABLET BY MOUTH IN THE MORNING, 1 TABLET IN THE MIDDAY, 2 TABLETS IN THE EVENING 360 tablet 3   ketoconazole (NIZORAL) 2 % cream Apply 1 application topically daily. 30 g 1   lisinopril-hydrochlorothiazide (ZESTORETIC) 20-12.5 MG tablet Take 1 tablet by mouth daily. 90 tablet 2   meclizine (ANTIVERT) 25 MG tablet Take 1 tablet (25 mg total) by mouth 3 (three) times daily as needed for dizziness. 90 tablet 0   ondansetron (ZOFRAN ODT) 4 MG disintegrating tablet Take 1 tablet (4 mg total) by mouth every 8 (eight) hours as needed for nausea or vomiting. 20 tablet 0   traMADol (ULTRAM) 50 MG tablet Take 1-2 tablets (50-100 mg total) by mouth every 8 (eight) hours as needed for moderate pain. Maximum 6 tabs per day. 30 tablet 0   celecoxib (CELEBREX) 200 MG capsule One to 2 tablets by mouth daily as needed for pain. 60 capsule 2   DULoxetine (CYMBALTA) 30 MG capsule Take 1 capsule (30 mg total) by mouth daily. 30 capsule 3   No facility-administered medications prior to visit.    Allergies  Allergen Reactions   Cymbalta [Duloxetine Hcl] Nausea Only    ROS Review of Systems    Objective:    Physical Exam Constitutional:      Appearance: She is well-developed.  HENT:     Head: Normocephalic and atraumatic.     Right Ear: Tympanic membrane, ear canal and external ear normal.     Left Ear: Tympanic membrane, ear canal and external ear normal.     Nose: Nose normal.     Mouth/Throat:     Mouth: Mucous membranes are moist.     Pharynx: Oropharynx is clear. No oropharyngeal exudate or posterior oropharyngeal erythema.  Eyes:     Conjunctiva/sclera: Conjunctivae normal.     Pupils: Pupils are equal, round,  and reactive to light.  Neck:     Thyroid: No thyromegaly.  Cardiovascular:     Rate and Rhythm: Normal rate and regular rhythm.     Heart sounds: Normal heart sounds.  Pulmonary:     Effort: Pulmonary effort is normal.     Breath sounds: Normal breath sounds. No wheezing.  Musculoskeletal:     Cervical back:  Neck supple.  Lymphadenopathy:     Cervical: No cervical adenopathy.  Skin:    General: Skin is warm and dry.  Neurological:     Mental Status: She is alert and oriented to person, place, and time.     Comments: Positive Dix-Hallpike maneuver to the right.    BP 134/68    Pulse 89    Ht '5\' 3"'  (1.6 m)    Wt 243 lb (110.2 kg)    SpO2 98%    BMI 43.05 kg/m  Wt Readings from Last 3 Encounters:  06/09/21 243 lb (110.2 kg)  04/29/21 246 lb (111.6 kg)  02/03/21 230 lb (104.3 kg)     Health Maintenance Due  Topic Date Due   COVID-19 Vaccine (3 - Booster for Moderna series) 02/12/2020    There are no preventive care reminders to display for this patient.  Lab Results  Component Value Date   TSH 1.89 06/26/2019   Lab Results  Component Value Date   WBC 11.4 (H) 04/29/2021   HGB 14.6 04/29/2021   HCT 42.4 04/29/2021   MCV 85.1 04/29/2021   PLT 340 04/29/2021   Lab Results  Component Value Date   NA 137 04/29/2021   K 3.9 04/29/2021   CO2 27 04/29/2021   GLUCOSE 77 04/29/2021   BUN 11 04/29/2021   CREATININE 0.81 04/29/2021   BILITOT 0.5 04/29/2021   ALKPHOS 62 08/31/2016   AST 26 04/29/2021   ALT 27 04/29/2021   PROT 6.8 04/29/2021   ALBUMIN 4.2 08/31/2016   CALCIUM 9.7 04/29/2021   ANIONGAP 7 11/18/2015   EGFR 87 04/29/2021   Lab Results  Component Value Date   CHOL 228 (H) 04/29/2021   Lab Results  Component Value Date   HDL 38 (L) 04/29/2021   Lab Results  Component Value Date   LDLCALC 150 (H) 04/29/2021   Lab Results  Component Value Date   TRIG 236 (H) 04/29/2021   Lab Results  Component Value Date   CHOLHDL 6.0 (H) 04/29/2021    Lab Results  Component Value Date   HGBA1C 5.5 12/14/2016      Assessment & Plan:   Problem List Items Addressed This Visit       Other   Vertigo    Most consistent with benign positional vertigo.  Her symptoms occurred with rotation to the right.  We discussed formal vestibular rehab.  And if and not improving then we can do further work-up including brain MRI and possible neurology referral.  But I do think her symptoms sound consistent with BPPV.      Relevant Orders   Ambulatory referral to Physical Therapy   Ambulatory referral to Physical Therapy   Situational mixed anxiety and depressive disorder    Felt nauseated on Cymbalta so discontinued.  We will add to intolerance list.  Already tried Lexapro but it was ineffective.  We will switch to fluoxetine.  We will to start with 10 mg to make sure that she can tolerate it well and then bump up to 20 mg if doing well with it.      Relevant Medications   FLUoxetine (PROZAC) 10 MG capsule   Other Visit Diagnoses     GAD (generalized anxiety disorder)    -  Primary   Relevant Medications   FLUoxetine (PROZAC) 10 MG capsule   Dizziness       Relevant Orders   Ambulatory referral to Physical Therapy   Ambulatory referral to Physical Therapy  Nonintractable episodic headache, unspecified headache type       Relevant Medications   FLUoxetine (PROZAC) 10 MG capsule       Meds ordered this encounter  Medications   FLUoxetine (PROZAC) 10 MG capsule    Sig: Take 1 capsule (10 mg total) by mouth daily.    Dispense:  30 capsule    Refill:  1    Follow-up: Return in about 4 weeks (around 07/07/2021) for New start medication.    Beatrice Lecher, MD

## 2021-06-09 NOTE — Assessment & Plan Note (Signed)
Most consistent with benign positional vertigo.  Her symptoms occurred with rotation to the right.  We discussed formal vestibular rehab.  And if and not improving then we can do further work-up including brain MRI and possible neurology referral.  But I do think her symptoms sound consistent with BPPV. ?

## 2021-06-09 NOTE — Assessment & Plan Note (Signed)
Felt nauseated on Cymbalta so discontinued.  We will add to intolerance list.  Already tried Lexapro but it was ineffective.  We will switch to fluoxetine.  We will to start with 10 mg to make sure that she can tolerate it well and then bump up to 20 mg if doing well with it. ?

## 2021-06-16 ENCOUNTER — Ambulatory Visit: Payer: BC Managed Care – PPO | Attending: Family Medicine | Admitting: Physical Therapy

## 2021-06-16 ENCOUNTER — Other Ambulatory Visit: Payer: Self-pay

## 2021-06-16 DIAGNOSIS — R42 Dizziness and giddiness: Secondary | ICD-10-CM | POA: Insufficient documentation

## 2021-06-16 DIAGNOSIS — R2681 Unsteadiness on feet: Secondary | ICD-10-CM | POA: Insufficient documentation

## 2021-06-16 DIAGNOSIS — F4323 Adjustment disorder with mixed anxiety and depressed mood: Secondary | ICD-10-CM | POA: Diagnosis not present

## 2021-06-16 NOTE — Therapy (Signed)
[facility] ?[address] ?[place], [place], [address] ?Phone: [phone]   Fax:  [phone] ? ?Physical Therapy Evaluation ? ?Patient Details  ?Name: ADDALIE CALLES ?MRN: [id number] ?Date of Birth: 1970-09-16 ?Referring Provider (PT): Hali Marry, MD ? ? ?Encounter Date: 06/16/2021 ? ? PT End of Session - 06/16/21 1524   ? ? Visit Number 1   ? Number of Visits 12   ? Date for PT Re-Evaluation 07/28/21   ? Authorization Type BCBS   ? PT Start Time 1522   ? PT Stop Time 1600   ? PT Time Calculation (min) 38 min   ? Activity Tolerance Patient tolerated treatment well   ? Behavior During Therapy [facility] for tasks assessed/performed   ? ?  ?  ? ?  ? ? ?Past Medical History:  ?Diagnosis Date  ? Anemia   ? Back pain   ? Complication of anesthesia   ? patient has not received anesthesia before but states father and brother have a hard time waking up from anesthesia  ? Family history of adverse reaction to anesthesia   ? mother "has a hard time waking up and moody"   ? Fibroid   ? Headache   ? Hypertension   ? Morbid obesity ([place])   ? Pneumonia   ? ? ?Past Surgical History:  ?Procedure Laterality Date  ? BREAST BIOPSY Right   ? benign  ? CARPAL TUNNEL RELEASE Right   ? CESAREAN SECTION  2000  ? DILATION AND CURETTAGE OF UTERUS N/A 09/01/2015  ? Procedure: DILATATION AND CURETTAGE;  Surgeon: Emily Filbert, MD;  Location: [facility];  Service: Gynecology;  Laterality: N/A;  ? IR GENERIC HISTORICAL  11/18/2015  ? IR US GUIDE VASC ACCESS RIGHT 11/18/2015 [facility]  ? IR GENERIC HISTORICAL  11/18/2015  ? IR ANGIOGRAM SELECTIVE EACH ADDITIONAL VESSEL 11/18/2015 [facility]  ? IR GENERIC HISTORICAL  11/18/2015  ? IR EMBO TUMOR ORGAN ISCHEMIA INFARCT INC GUIDE ROADMAPPING 11/18/2015 [facility]  ? IR GENERIC HISTORICAL  11/18/2015  ? IR ANGIOGRAM PELVIS SELECTIVE OR SUPRASELECTIVE 11/18/2015 [facility]  ? IR GENERIC HISTORICAL  11/18/2015  ? IR ANGIOGRAM SELECTIVE  EACH ADDITIONAL VESSEL 11/18/2015 [facility]  ? IR GENERIC HISTORICAL  11/18/2015  ? IR ANGIOGRAM PELVIS SELECTIVE OR SUPRASELECTIVE 11/18/2015 [facility]  ? IR GENERIC HISTORICAL  02/23/2016  ? IR RADIOLOGIST EVAL & MGMT 02/23/2016 Jacqulynn Cadet, MD [facility]  ? IR GENERIC HISTORICAL  12/02/2015  ? IR RADIOLOGIST EVAL & MGMT 12/02/2015 Greggory Keen, MD [facility]  ? IR RADIOLOGIST EVAL & MGMT  08/24/2016  ? WISDOM TOOTH EXTRACTION    ? ? ?There were no vitals filed for this visit. ? ? ? Subjective Assessment - 06/16/21 1524   ? ? Subjective Pt reports that Apr in 2021 she had her first vertigo situation. Pt states that the entire room was spinning. Pt had it for a while, pt was provided exercises and medication. Took a while to get back to normal. Pt states that since that point until now, every so often she will have a situation with vertigo. Sometimes it's controllable with exercises. Sometimes it can last until the evening. On rare occasions she can't drive. Since last bad episode 2-3 weeks ago she has had a small spell.   ? Limitations Walking   ? How long can you sit comfortably? n/a   ? How long can you stand comfortably? n/a   ?  How long can you walk comfortably? n/a   ? Currently in Pain? No/denies   ? ?  ?  ? ?  ? ? ? ? ? [facility] PT Assessment - 06/16/21 0001   ? ?  ? Assessment  ? Medical Diagnosis R42 (ICD-10-CM) - Dizziness  R42 (ICD-10-CM) - Vertigo   ? Referring Provider (PT) Hali Marry, MD   ? Onset Date/Surgical Date --   2-3 weeks ago  ? Prior Therapy None   ?  ? Precautions  ? Precautions None   ?  ? Restrictions  ? Weight Bearing Restrictions No   ?  ? Balance Screen  ? Has the patient fallen in the past 6 months No   ?  ? Home Environment  ? Living Environment Private residence   ?  ? Prior Function  ? Level of Independence Independent   ? 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  ? Observation/Other Assessments  ? Focus on  Therapeutic Outcomes (FOTO)  n/a   ? ?  ?  ? ?  ? ? ? ? ? ? ? ? ? Vestibular Assessment - 06/16/21 0001   ? ?  ? Symptom Behavior  ? Subjective history of current problem Reports performing exercises in quadruped   ? Type of Dizziness  Vertigo;Spinning   ? Frequency of Dizziness once a month   ? Duration of Dizziness 1-2 hours   ? Symptom Nature Motion provoked   ? Aggravating Factors Turning head quickly;Mornings;Turning head sideways;Rolling to right   ? Relieving Factors Slow movements   ? Progression of Symptoms Better   ? History of similar episodes Apr 2021.   ?  ? Oculomotor Exam  ? Oculomotor Alignment Normal   ? Ocular ROM WFL   ? Spontaneous Absent   ? Gaze-induced  Absent   ? Head shaking Horizontal Absent   ? Head Shaking Vertical Absent   ? Smooth Pursuits Intact   ? Saccades Hypometric;Slow   worse with horizontal; vertical appears WFL  ?  ? Oculomotor Exam-Fixation Suppressed   ? Left Head Impulse WFL, asymptomatic   ? Right Head Impulse Mild corrective saccades   ?  ? Vestibulo-Ocular Reflex  ? VOR 1 Head Only (x 1 viewing) WFL   ? VOR 2 Head and Object (x 2 viewing) WFL   ? VOR to Slow Head Movement Normal   ? VOR Cancellation Normal   ?  ? Positional Testing  ? Dix-Hallpike Dix-Hallpike Right;Dix-Hallpike Left   ? Sidelying Test Sidelying Right;Sidelying Left   ? Horizontal Canal Testing Horizontal Canal Right;Horizontal Canal Left   ?  ? Dix-Hallpike Right  ? Dix-Hallpike Right Duration ~5 sec   ? Dix-Hallpike Right Symptoms --   Unable to visualize nystagmus; however, pt felt symptoms  ?  ? Dix-Hallpike Left  ? Dix-Hallpike Left Duration ~5 sec   ? Dix-Hallpike Left Symptoms --   Unable to visualize nystagmus; however, pt felt symptoms  ?  ? Sidelying Right  ? Sidelying Right Duration ~8 sec   ? Sidelying Right Symptoms --   Unable to visualize nystagmus; however, pt felt symptoms of room spinning  ?  ? Sidelying Left  ? Sidelying Left Duration 0   ?  ? Horizontal Canal Right  ? Horizontal Canal  Right Duration 0   ?  ? Horizontal Canal Left  ? Horizontal Canal Left Duration 0   ? ?  ?  ? ?  ? ? ? ? ? ?  Objective measurements completed on examination: See above findings.  ? ? ? ? ? ? Vestibular Treatment/Exercise - 06/16/21 0001   ? ?  ? Vestibular Treatment/Exercise  ? Vestibular Treatment Provided Canalith Repositioning   ? Canalith Repositioning Semont Procedure Right Posterior   ?  ? Semont Procedure Right Posterior  ? Number of Reps  1   ? Response Details  Increased dizziness noted with pt forehead down on mat   ? ?  ?  ? ?  ? ? ? ? ? ? ? ? ? PT Education - 06/16/21 1621   ? ? Education Details Discussed BPPV anatomy and pathophysiology. Discussed exam findings and canalith repositioning.   ? Person(s) Educated Patient   ? Methods Explanation;Demonstration;Tactile cues;Verbal cues;Handout   ? Comprehension Verbalized understanding;Returned demonstration;Verbal cues required;Tactile cues required   ? ?  ?  ? ?  ? ? ? ? ? ? PT Long Term Goals - 06/16/21 1657   ? ?  ? PT LONG TERM GOAL #1  ? Title Pt will be independent with HEP   ? Time 6   ? Period Weeks   ? Status New   ? Target Date 07/28/21   ?  ? PT LONG TERM GOAL #2  ? Title Pt will report >/=50% improvement in dizzy symptoms   ? Time 6   ? Period Weeks   ? Status New   ? Target Date 07/28/21   ?  ? PT LONG TERM GOAL #3  ? Title Pt will report no dizziness in all canalith testing positions   ? Time 6   ? Period Weeks   ? Status New   ? Target Date 07/28/21   ?  ? PT LONG TERM GOAL #4  ? Title Pt will have improved FGA by a least 6 points to demo [place]   ? Time 6   ? Period Weeks   ? Status New   ? Target Date 07/28/21   ? ?  ?  ? ?  ? ? ? ? ? ? ? ? ? Plan - 06/16/21 1622   ? ? Clinical Impression Statement Ms. Nareh Matzke is a 51 y/o F presenting to [facility] due to complaint of ongoing vertigo. Has had PMH of vertigo. On assessment, pt with normal oculomotor movements. Mild symptoms with saccades. VOR appears intact. No overt nystagmus noted with  canalith testing for BPPV; however, pt symptomatic in Dix-Hallpike for L posterior canalith and sidelying for R posterior canalith. Performed semont maneuver to right which did make pt symptomatic; pt requ

## 2021-06-22 ENCOUNTER — Ambulatory Visit: Payer: BC Managed Care – PPO | Admitting: Physical Therapy

## 2021-06-22 ENCOUNTER — Other Ambulatory Visit: Payer: Self-pay

## 2021-06-22 DIAGNOSIS — R2681 Unsteadiness on feet: Secondary | ICD-10-CM

## 2021-06-22 DIAGNOSIS — R42 Dizziness and giddiness: Secondary | ICD-10-CM | POA: Diagnosis not present

## 2021-06-22 NOTE — Therapy (Addendum)
[facility] [place] [place], [place], [address] Phone: [phone]   Fax:  [phone]  Physical Therapy Treatment and Discharge  Patient Details  Name: Elizabeth Copeland MRN: [id number] Date of Birth: 19-May-1970 Referring Provider (PT): Hali Marry, MD  PHYSICAL THERAPY DISCHARGE SUMMARY  Visits from Start of Care: 2  Current functional level related to goals / functional outcomes: See below   Remaining deficits: See below   Education / Equipment: Self Epley   Patient agrees to discharge. Patient goals were met. Patient is being discharged due to meeting the stated rehab goals.  Encounter Date: 06/22/2021   PT End of Session - 06/22/21 0801     Visit Number 2    Number of Visits 12    Date for PT Re-Evaluation 07/28/21    Authorization Type BCBS    PT Start Time 0801    PT Stop Time 0845    PT Time Calculation (min) 44 min    Activity Tolerance Patient tolerated treatment well    Behavior During Therapy WFL for tasks assessed/performed             Past Medical History:  Diagnosis Date   Anemia    Back pain    Complication of anesthesia    patient has not received anesthesia before but states father and brother have a hard time waking up from anesthesia   Family history of adverse reaction to anesthesia    mother "has a hard time waking up and moody"    Fibroid    Headache    Hypertension    Morbid obesity ([place])    Pneumonia     Past Surgical History:  Procedure Laterality Date   BREAST BIOPSY Right    benign   CARPAL TUNNEL RELEASE Right    CESAREAN SECTION  2000   DILATION AND CURETTAGE OF UTERUS N/A 09/01/2015   Procedure: DILATATION AND CURETTAGE;  Surgeon: Emily Filbert, MD;  Location: [facility];  Service: Gynecology;  Laterality: N/A;   IR GENERIC HISTORICAL  11/18/2015   IR US GUIDE VASC ACCESS RIGHT 11/18/2015 [facility]   IR GENERIC HISTORICAL  11/18/2015   IR  ANGIOGRAM SELECTIVE EACH ADDITIONAL VESSEL 11/18/2015 [facility]   IR GENERIC HISTORICAL  11/18/2015   IR EMBO TUMOR ORGAN ISCHEMIA INFARCT INC GUIDE ROADMAPPING 11/18/2015 [facility]   IR GENERIC HISTORICAL  11/18/2015   IR ANGIOGRAM PELVIS SELECTIVE OR SUPRASELECTIVE 11/18/2015 [facility]   IR GENERIC HISTORICAL  11/18/2015   IR ANGIOGRAM SELECTIVE EACH ADDITIONAL VESSEL 11/18/2015 [facility]   IR GENERIC HISTORICAL  11/18/2015   IR ANGIOGRAM PELVIS SELECTIVE OR SUPRASELECTIVE 11/18/2015 [facility]   IR GENERIC HISTORICAL  02/23/2016   IR RADIOLOGIST EVAL & MGMT 02/23/2016 Jacqulynn Cadet, MD [facility]   IR GENERIC HISTORICAL  12/02/2015   IR RADIOLOGIST EVAL & MGMT 12/02/2015 Greggory Keen, MD [facility]   IR RADIOLOGIST EVAL & MGMT  08/24/2016   WISDOM TOOTH EXTRACTION      There were no vitals filed for this visit.   Subjective Assessment - 06/22/21 0805     Subjective Pt reports no episodes since last session. Pt reports it took 2.5 days to settle after canalith repositioning.    Limitations Walking    How long can you sit comfortably? n/a    How long can you stand comfortably? n/a    How long can you walk comfortably? n/a    Currently  in Pain? No/denies                     Vestibular Assessment - 06/22/21 0001       Dix-Hallpike Right   Dix-Hallpike Right Duration ~2 sec    Dix-Hallpike Right Symptoms --   Unable to visualize     Dix-Hallpike Left   Dix-Hallpike Left Duration ~2 sec    Dix-Hallpike Left Symptoms --   Unable to visualize due to swiftness of s/s     Sidelying Right   Sidelying Right Duration 0      Sidelying Left   Sidelying Left Duration 0      Horizontal Canal Right   Horizontal Canal Right Duration 0      Horizontal Canal Left   Horizontal Canal Left Duration 0                       Vestibular Treatment/Exercise - 06/22/21 0001       Vestibular Treatment/Exercise   Canalith  Repositioning Epley Manuever Right;Epley Manuever Left       EPLEY MANUEVER RIGHT   Number of Reps  1    Overall Response Symptoms Resolved       EPLEY MANUEVER LEFT   Number of Reps  1    Overall Response  Symptoms Resolved                         PT Long Term Goals - 06/22/21 0834       PT LONG TERM GOAL #1   Title Pt will be independent with HEP    Time 6    Period Weeks    Status Achieved    Target Date 07/28/21      PT LONG TERM GOAL #2   Title Pt will report >/=50% improvement in dizzy symptoms    Time 6    Period Weeks    Status Achieved    Target Date 07/28/21      PT LONG TERM GOAL #3   Title Pt will report no dizziness in all canalith testing positions    Baseline On retesting after performing maneuvers, no dizziness    Time 6    Period Weeks    Status Achieved    Target Date 07/28/21      PT LONG TERM GOAL #4   Title Pt will have improved FGA by a least 6 points to demo Daniel    Baseline Did not end up needing to assess    Time 6    Period Weeks    Status Deferred    Target Date 07/28/21                   Plan - 06/22/21 8341     Clinical Impression Statement Pt returns with no reports of dizziness at home. Re-checked canalith -- continued mild s/s with L and R Dix-Hallpike. Treated accordingly with Epley maneuver. Sidelying testing all (-) this session. Went over how to perform self Epley maneuver at home to manage symptoms. At this point, pt feels she has no PT needs. Will call if any issues within the next month.    Personal Factors and Comorbidities Age;Time since onset of injury/illness/exacerbation    Examination-Activity Limitations Sleep;Locomotion Level;Transfers;Bed Mobility    Examination-Participation Restrictions Driving;Occupation    Stability/Clinical Decision Making Evolving/Moderate complexity    Rehab Potential Good    PT Frequency 2x / week  PT Duration 6 weeks    PT Treatment/Interventions Aquatic  Therapy;ADLs/Self Care Home Management;Canalith Repostioning;Electrical Stimulation;Iontophoresis 66m/ml Dexamethasone;Moist Heat;Traction;Gait training;Stair training;Functional mobility training;Therapeutic activities;Therapeutic exercise;Balance training;Neuromuscular re-education;Manual techniques;Orthotic Fit/Training;Passive range of motion;Dry needling;Taping;Patient/family education;Vestibular    PT Next Visit Plan Reassess canaliths for BPPV. Review semont maneuver. Assess FGA. Begin gaze stabilization exercises    PT Home Exercise Plan Semont or Epley maneuver at home as needed    Consulted and Agree with Plan of Care Patient             Patient will benefit from skilled therapeutic intervention in order to improve the following deficits and impairments:  Dizziness, Decreased balance, Difficulty walking  Visit Diagnosis: Dizziness and giddiness  Unsteadiness on feet     Problem List Patient Active Problem List   Diagnosis Date Noted   Vertigo 06/09/2021   Injury of second toe of right foot 09/02/2020   Carpal tunnel syndrome, bilateral 07/31/2020   Situational mixed anxiety and depressive disorder 06/24/2020   Renal cyst, left 12/25/2019   Synovitis of right hand 12/25/2019   Radiculitis of right cervical region 10/22/2019   Left midfoot sprain 035/43/0148  Metabolic syndrome 040/39/7953  Fibromyalgia 12/02/2017   Plantar fasciitis of left foot 06/28/2017   Trochanteric bursitis of right hip 06/28/2017   Fatty liver 09/02/2016   Morbid obesity ([place] 07/21/2015   Intramural leiomyoma of uterus 06/03/2015   Vitamin D deficiency 06/03/2015   Anemia, iron deficiency 06/03/2015   Microcytic anemia 05/07/2015   Essential hypertension, benign 05/06/2015   Ulnar neuropathy of right upper extremity 02/19/2014   Uterine fibroid with menorrhagia and anemia 12/25/2012   Right lumbar radiculitis 12/19/2012   Back pain 02/17/2011   BMI 40.0-44.9, adult ([facility] 02/17/2011     Oleda Borski April MGordy Levan PT, DPT 06/22/2021, 8:35 AM  [facility]ock[place] [address]Phone: [phone]  Fax:  [phone] Name: JBLU MCGLAUNMRN: [id number] of Birth: [birth year]

## 2021-06-23 ENCOUNTER — Ambulatory Visit: Payer: BC Managed Care – PPO | Admitting: Physical Therapy

## 2021-06-25 ENCOUNTER — Encounter: Payer: BC Managed Care – PPO | Admitting: Physical Therapy

## 2021-06-30 DIAGNOSIS — F4323 Adjustment disorder with mixed anxiety and depressed mood: Secondary | ICD-10-CM | POA: Diagnosis not present

## 2021-07-14 DIAGNOSIS — F4323 Adjustment disorder with mixed anxiety and depressed mood: Secondary | ICD-10-CM | POA: Diagnosis not present

## 2021-07-28 DIAGNOSIS — F4323 Adjustment disorder with mixed anxiety and depressed mood: Secondary | ICD-10-CM | POA: Diagnosis not present

## 2021-08-04 ENCOUNTER — Encounter: Payer: Self-pay | Admitting: Sports Medicine

## 2021-08-04 DIAGNOSIS — M5416 Radiculopathy, lumbar region: Secondary | ICD-10-CM

## 2021-08-04 MED ORDER — GABAPENTIN 800 MG PO TABS: ORAL_TABLET | ORAL | 0 refills | Status: DC

## 2021-08-11 ENCOUNTER — Ambulatory Visit (INDEPENDENT_AMBULATORY_CARE_PROVIDER_SITE_OTHER): Payer: BC Managed Care – PPO

## 2021-08-11 ENCOUNTER — Encounter: Payer: Self-pay | Admitting: Sports Medicine

## 2021-08-11 ENCOUNTER — Ambulatory Visit: Payer: BC Managed Care – PPO | Admitting: Sports Medicine

## 2021-08-11 DIAGNOSIS — M25561 Pain in right knee: Secondary | ICD-10-CM | POA: Diagnosis not present

## 2021-08-11 DIAGNOSIS — M5416 Radiculopathy, lumbar region: Secondary | ICD-10-CM | POA: Diagnosis not present

## 2021-08-11 DIAGNOSIS — G8929 Other chronic pain: Secondary | ICD-10-CM

## 2021-08-11 DIAGNOSIS — M170 Bilateral primary osteoarthritis of knee: Secondary | ICD-10-CM | POA: Diagnosis not present

## 2021-08-11 DIAGNOSIS — M25562 Pain in left knee: Secondary | ICD-10-CM | POA: Diagnosis not present

## 2021-08-11 DIAGNOSIS — S99921A Unspecified injury of right foot, initial encounter: Secondary | ICD-10-CM | POA: Diagnosis not present

## 2021-08-11 DIAGNOSIS — M1712 Unilateral primary osteoarthritis, left knee: Secondary | ICD-10-CM | POA: Insufficient documentation

## 2021-08-11 MED ORDER — TRAMADOL HCL 50 MG PO TABS: 50.0000 mg | ORAL_TABLET | Freq: Three times a day (TID) | ORAL | 0 refills | Status: DC | PRN

## 2021-08-11 MED ORDER — DICLOFENAC SODIUM 75 MG PO TBEC: 75.0000 mg | DELAYED_RELEASE_TABLET | Freq: Two times a day (BID) | ORAL | 3 refills | Status: DC

## 2021-08-11 MED ORDER — GABAPENTIN 800 MG PO TABS: ORAL_TABLET | ORAL | 3 refills | Status: DC

## 2021-08-11 NOTE — Assessment & Plan Note (Signed)
Pleasant 51 year old female, morbid obesity, recently started walking program, now having increasing pain medial joint line, patellofemoral, moderate gelling. ?On exam she has minimal swelling, tenderness at the medial joint line and along the patellar tendon, as well as the patellar facets, adding x-rays, switching from ibuprofen to Voltaren, adding home conditioning. ?Tramadol for breakthrough pain as well ?

## 2021-08-11 NOTE — Assessment & Plan Note (Signed)
Also complaining of right lumbar radicular pain with radiation to the anterior lateral lower leg. ?Continue gabapentin for now, will continue epidurals if insufficiently better at the follow-up visit. ?

## 2021-08-11 NOTE — Progress Notes (Signed)
? ? ?  Procedures performed today:   ? ?None. ? ?Independent interpretation of notes and tests performed by another provider:  ? ?None. ? ?Brief History, Exam, Impression, and Recommendations:   ? ?Chronic pain of left knee ?Pleasant 50 year old female, morbid obesity, recently started walking program, now having increasing pain medial joint line, patellofemoral, moderate gelling. ?On exam she has minimal swelling, tenderness at the medial joint line and along the patellar tendon, as well as the patellar facets, adding x-rays, switching from ibuprofen to Voltaren, adding home conditioning. ?Tramadol for breakthrough pain as well ? ?Right lumbar radiculitis ?Also complaining of right lumbar radicular pain with radiation to the anterior lateral lower leg. ?Continue gabapentin for now, will continue epidurals if insufficiently better at the follow-up visit. ? ?Chronic process with exacerbation and pharmacologic intervention ? ?___________________________________________ ?Gwen Her. Dianah Field, M.D., ABFM., CAQSM. ?Primary Care and Sports Medicine ?[place] ? ?Adjunct Instructor of Family Medicine  ?[employer] ?

## 2021-08-12 ENCOUNTER — Other Ambulatory Visit: Payer: Self-pay | Admitting: Family Medicine

## 2021-08-12 DIAGNOSIS — F411 Generalized anxiety disorder: Secondary | ICD-10-CM

## 2021-08-20 ENCOUNTER — Encounter: Payer: Self-pay | Admitting: Family Medicine

## 2021-08-20 ENCOUNTER — Ambulatory Visit: Payer: BC Managed Care – PPO | Admitting: Family Medicine

## 2021-08-20 VITALS — BP 132/78 | HR 86 | Resp 18 | Ht 63.0 in | Wt 247.0 lb

## 2021-08-20 DIAGNOSIS — H1013 Acute atopic conjunctivitis, bilateral: Secondary | ICD-10-CM

## 2021-08-20 MED ORDER — OLOPATADINE HCL 0.2 % OP SOLN: 1.0000 [drp] | Freq: Every day | OPHTHALMIC | 1 refills | Status: DC

## 2021-08-20 NOTE — Progress Notes (Signed)
   Acute Office Visit  Subjective:     Patient ID: Elizabeth Copeland, female    DOB: [birth year], 51 y.o.   MRN: [id number]  Chief Complaint  Patient presents with   Eye Drainage    Patient complains of red, itchy eyes with drainage. Patient states eyes burn as well.    skin tag removal    Left eye     HPI Patient is in today for eye symptoms. Patient complains of red, itchy eyes with drainage. Patient states eyes burn as well.  Other cold symptoms.  She does use Systane drops for dry eyes but says they were really actually starting to irritate her eyes so she stopped them.  She did restart her Zyrtec which she uses for allergies about 2 weeks ago when the eye symptoms started but it does not seem to be really helping.   ROS      Objective:    BP 132/78   Pulse 86   Resp 18   Ht '5\' 3"'$  (1.6 m)   Wt 247 lb (112 kg)   LMP 08/08/2021   SpO2 94%   BMI 43.75 kg/m    Physical Exam Constitutional:      Appearance: She is well-developed.  HENT:     Head: Normocephalic and atraumatic.     Right Ear: External ear normal.     Left Ear: External ear normal.     Nose: Nose normal.  Eyes:     Extraocular Movements: Extraocular movements intact.     Conjunctiva/sclera: Conjunctivae normal.     Pupils: Pupils are equal, round, and reactive to light.     Comments: Mild slcera injection  Neck:     Thyroid: No thyromegaly.  Cardiovascular:     Rate and Rhythm: Normal rate and regular rhythm.     Heart sounds: Normal heart sounds.  Pulmonary:     Effort: Pulmonary effort is normal.     Breath sounds: Normal breath sounds. No wheezing.  Musculoskeletal:     Cervical back: Neck supple.  Lymphadenopathy:     Cervical: No cervical adenopathy.  Skin:    General: Skin is warm and dry.  Neurological:     Mental Status: She is alert and oriented to person, place, and time.    No results found for any visits on 08/20/21.      Assessment & Plan:   Problem List Items Addressed  This Visit   None Visit Diagnoses     Allergic conjunctivitis of both eyes    -  Primary       Allergic conjunctivitis-symptoms most consistent with allergies.  We will treat with Pataday.  It is now over-the-counter.  If she is not better after the weekend then please give [place] a call back.  If it is irritating to the eye okay to stop.  Meds ordered this encounter  Medications   Olopatadine HCl (PATADAY) 0.2 % SOLN    Sig: Apply 1 drop to eye daily.    Dispense:  2.5 mL    Refill:  1    No follow-ups on file.  Beatrice Lecher, MD

## 2021-08-24 ENCOUNTER — Ambulatory Visit: Payer: BC Managed Care – PPO | Admitting: Family Medicine

## 2021-08-25 DIAGNOSIS — F4323 Adjustment disorder with mixed anxiety and depressed mood: Secondary | ICD-10-CM | POA: Diagnosis not present

## 2021-09-08 ENCOUNTER — Ambulatory Visit: Payer: BC Managed Care – PPO | Admitting: Sports Medicine

## 2021-09-08 ENCOUNTER — Ambulatory Visit (INDEPENDENT_AMBULATORY_CARE_PROVIDER_SITE_OTHER): Payer: BC Managed Care – PPO

## 2021-09-08 DIAGNOSIS — M1712 Unilateral primary osteoarthritis, left knee: Secondary | ICD-10-CM | POA: Diagnosis not present

## 2021-09-08 DIAGNOSIS — F4323 Adjustment disorder with mixed anxiety and depressed mood: Secondary | ICD-10-CM | POA: Diagnosis not present

## 2021-09-08 DIAGNOSIS — M47817 Spondylosis without myelopathy or radiculopathy, lumbosacral region: Secondary | ICD-10-CM

## 2021-09-08 NOTE — Progress Notes (Signed)
    Procedures performed today:    Procedure: Real-time Ultrasound Guided injection of the left knee Device: Samsung HS60  Verbal informed consent obtained.  Time-out conducted.  Noted no overlying erythema, induration, or other signs of local infection.  Skin prepped in a sterile fashion.  Local anesthesia: Topical Ethyl chloride.  With sterile technique and under real time ultrasound guidance: Mild effusion noted, 1 cc Kenalog 40, 2 cc lidocaine, 2 cc bupivacaine injected easily Completed without difficulty  Advised to call if fevers/chills, erythema, induration, drainage, or persistent bleeding.  Images permanently stored and available for review in PACS.  Impression: Technically successful ultrasound guided injection.  Independent interpretation of notes and tests performed by another provider:   None.  Brief History, Exam, Impression, and Recommendations:    Primary osteoarthritis of left knee Pleasant 51 year old female, having trouble doing the home conditioning, we switched from ibuprofen to Voltaren and added some home conditioning. She has tramadol for breakthrough pain. Unable to do the home conditioning, x-rays did show osteoarthritis, we did inject the knee today in the hopes that this would facilitate her working harder with the rehab.  Lumbosacral spondylosis Also having right-sided low back pain with radiation to the buttock, posterior thigh on the right side and the lateral lower leg but not past the foot. Continues with high-dose gabapentin. MRI did show some mild lumbar degenerative changes. She did have some epidurals in the past, only one epidural was effective. We may consider SI joint as a potential pain generator, we will consider injecting this at the follow-up visit. She did have an injection with Dr. Mina Marble at [place], that provided several hours of relief.    ___________________________________________ Gwen Her. Dianah Field, M.D., ABFM.,  CAQSM. Primary Care and [place] Instructor of [place] [employer]

## 2021-09-08 NOTE — Assessment & Plan Note (Signed)
Also having right-sided low back pain with radiation to the buttock, posterior thigh on the right side and the lateral lower leg but not past the foot. Continues with high-dose gabapentin. MRI did show some mild lumbar degenerative changes. She did have some epidurals in the past, only one epidural was effective. We may consider SI joint as a potential pain generator, we will consider injecting this at the follow-up visit. She did have an injection with Dr. Mina Marble at [place], that provided several hours of relief.

## 2021-09-08 NOTE — Assessment & Plan Note (Signed)
Pleasant 51 year old female, having trouble doing the home conditioning, we switched from ibuprofen to Voltaren and added some home conditioning. She has tramadol for breakthrough pain. Unable to do the home conditioning, x-rays did show osteoarthritis, we did inject the knee today in the hopes that this would facilitate her working harder with the rehab.

## 2021-10-06 DIAGNOSIS — F4323 Adjustment disorder with mixed anxiety and depressed mood: Secondary | ICD-10-CM | POA: Diagnosis not present

## 2021-10-20 DIAGNOSIS — F4310 Post-traumatic stress disorder, unspecified: Secondary | ICD-10-CM | POA: Diagnosis not present

## 2021-11-17 DIAGNOSIS — F4310 Post-traumatic stress disorder, unspecified: Secondary | ICD-10-CM | POA: Diagnosis not present

## 2021-11-18 ENCOUNTER — Other Ambulatory Visit: Payer: Self-pay

## 2021-11-18 ENCOUNTER — Emergency Department (HOSPITAL_BASED_OUTPATIENT_CLINIC_OR_DEPARTMENT_OTHER): Payer: BC Managed Care – PPO

## 2021-11-18 ENCOUNTER — Ambulatory Visit
Admission: EM | Admit: 2021-11-18 | Discharge: 2021-11-18 | Disposition: A | Discharge status: Other Acute Inpatient Hospital | Payer: BC Managed Care – PPO

## 2021-11-18 ENCOUNTER — Encounter (HOSPITAL_BASED_OUTPATIENT_CLINIC_OR_DEPARTMENT_OTHER): Payer: Self-pay | Admitting: Emergency Medicine

## 2021-11-18 ENCOUNTER — Emergency Department (HOSPITAL_BASED_OUTPATIENT_CLINIC_OR_DEPARTMENT_OTHER)
Admission: EM | Admit: 2021-11-18 | Discharge: 2021-11-18 | Disposition: A | Discharge status: Home / Self Care | Payer: BC Managed Care – PPO | Attending: Emergency Medicine | Admitting: Emergency Medicine

## 2021-11-18 DIAGNOSIS — Z79899 Other long term (current) drug therapy: Secondary | ICD-10-CM | POA: Diagnosis not present

## 2021-11-18 DIAGNOSIS — R197 Diarrhea, unspecified: Secondary | ICD-10-CM | POA: Diagnosis not present

## 2021-11-18 DIAGNOSIS — N281 Cyst of kidney, acquired: Secondary | ICD-10-CM | POA: Diagnosis not present

## 2021-11-18 DIAGNOSIS — R509 Fever, unspecified: Secondary | ICD-10-CM

## 2021-11-18 DIAGNOSIS — K760 Fatty (change of) liver, not elsewhere classified: Secondary | ICD-10-CM | POA: Diagnosis not present

## 2021-11-18 DIAGNOSIS — R109 Unspecified abdominal pain: Secondary | ICD-10-CM | POA: Diagnosis not present

## 2021-11-18 DIAGNOSIS — R1032 Left lower quadrant pain: Secondary | ICD-10-CM | POA: Diagnosis not present

## 2021-11-18 LAB — POCT URINALYSIS DIP (MANUAL ENTRY)
Bilirubin, UA: NEGATIVE
Glucose, UA: NEGATIVE mg/dL
Ketones, POC UA: NEGATIVE mg/dL
Leukocytes, UA: NEGATIVE
Nitrite, UA: NEGATIVE
Protein Ur, POC: NEGATIVE mg/dL
Spec Grav, UA: 1.015 (ref 1.010–1.025)
Urobilinogen, UA: 0.2 E.U./dL
pH, UA: 5.5 (ref 5.0–8.0)

## 2021-11-18 LAB — URINALYSIS, ROUTINE W REFLEX MICROSCOPIC
Bilirubin Urine: NEGATIVE
Glucose, UA: NEGATIVE mg/dL
Ketones, ur: NEGATIVE mg/dL
Leukocytes,Ua: NEGATIVE
Nitrite: NEGATIVE
Protein, ur: NEGATIVE mg/dL
Specific Gravity, Urine: 1.01 (ref 1.005–1.030)
pH: 5.5 (ref 5.0–8.0)

## 2021-11-18 LAB — COMPREHENSIVE METABOLIC PANEL
ALT: 27 U/L (ref 0–44)
AST: 32 U/L (ref 15–41)
Albumin: 4.1 g/dL (ref 3.5–5.0)
Alkaline Phosphatase: 67 U/L (ref 38–126)
Anion gap: 10 (ref 5–15)
BUN: 11 mg/dL (ref 6–20)
CO2: 23 mmol/L (ref 22–32)
Calcium: 9.2 mg/dL (ref 8.9–10.3)
Chloride: 101 mmol/L (ref 98–111)
Creatinine, Ser: 0.91 mg/dL (ref 0.44–1.00)
GFR, Estimated: >60 mL/min (ref >60)
Glucose, Bld: 101 mg/dL — ABNORMAL HIGH (ref 70–99)
Potassium: 4.2 mmol/L (ref 3.5–5.1)
Sodium: 134 mmol/L — ABNORMAL LOW (ref 135–145)
Total Bilirubin: 0.8 mg/dL (ref 0.3–1.2)
Total Protein: 7.7 g/dL (ref 6.5–8.1)

## 2021-11-18 LAB — URINALYSIS, MICROSCOPIC (REFLEX)
Bacteria, UA: NONE SEEN
RBC / HPF: NONE SEEN RBC/hpf (ref 0–5)
WBC, UA: NONE SEEN WBC/hpf (ref 0–5)

## 2021-11-18 LAB — CBC
HCT: 40.7 % (ref 36.0–46.0)
Hemoglobin: 13.7 g/dL (ref 12.0–15.0)
MCH: 28.4 pg (ref 26.0–34.0)
MCHC: 33.7 g/dL (ref 30.0–36.0)
MCV: 84.4 fL (ref 80.0–100.0)
Platelets: 330 10*3/uL (ref 150–400)
RBC: 4.82 MIL/uL (ref 3.87–5.11)
RDW: 13.1 % (ref 11.5–15.5)
WBC: 12.9 10*3/uL — ABNORMAL HIGH (ref 4.0–10.5)
nRBC: 0 % (ref 0.0–0.2)

## 2021-11-18 LAB — LIPASE, BLOOD: Lipase: 50 U/L (ref 11–51)

## 2021-11-18 LAB — PREGNANCY, URINE: Preg Test, Ur: NEGATIVE

## 2021-11-18 MED ORDER — SODIUM CHLORIDE 0.9 % IV BOLUS
1000.0000 mL | Freq: Once | INTRAVENOUS | Status: AC
  Administered 2021-11-18: 1000 mL via INTRAVENOUS

## 2021-11-18 MED ORDER — ONDANSETRON HCL 4 MG/2ML IJ SOLN
4.0000 mg | Freq: Once | INTRAMUSCULAR | Status: DC
  Filled 2021-11-18: qty 2

## 2021-11-18 MED ORDER — IOHEXOL 300 MG/ML  SOLN
125.0000 mL | Freq: Once | INTRAMUSCULAR | Status: AC | PRN
  Administered 2021-11-18: 125 mL via INTRAVENOUS

## 2021-11-18 MED ORDER — KETOROLAC TROMETHAMINE 15 MG/ML IJ SOLN: 15.0000 mg | Freq: Once | INTRAMUSCULAR | Status: DC

## 2021-11-18 MED ORDER — MORPHINE SULFATE (PF) 4 MG/ML IV SOLN
4.0000 mg | Freq: Once | INTRAVENOUS | Status: DC
  Filled 2021-11-18: qty 1

## 2021-11-18 NOTE — ED Notes (Signed)
Pt states she does not want the morphine or the zofran at this time  States she is driving so therefore she may not receive the morphine and pt denies nausea at this time

## 2021-11-18 NOTE — ED Notes (Signed)
Patient transported to CT 

## 2021-11-18 NOTE — ED Provider Notes (Signed)
[facility]    CSN: [id number] Arrival date & time: 11/18/21  1804      History   Chief Complaint Chief Complaint  Patient presents with   Abdominal Pain   Back Pain    HPI Elizabeth Copeland is a 51 y.o. female.   HPI 51 year old female presents with LLQ abdominal pain and back pain since yesterday.  Reports diarrhea yesterday but has subsided.  Patient denies urinary symptoms or history of kidney stones.  PMH significant for lumbarsacral spondylosis, morbid obesity, fibromyalgia, and HTN.  Patient is febrile in triage oral temp 100.0   Past Medical History:  Diagnosis Date   Anemia    Back pain    Complication of anesthesia    patient has not received anesthesia before but states father and brother have a hard time waking up from anesthesia   Family history of adverse reaction to anesthesia    mother "has a hard time waking up and moody"    Fibroid    Headache    Hypertension    Morbid obesity ([place])    Pneumonia     Patient Active Problem List   Diagnosis Date Noted   Primary osteoarthritis of left knee 08/11/2021   Vertigo 06/09/2021   Carpal tunnel syndrome, bilateral 07/31/2020   Situational mixed anxiety and depressive disorder 06/24/2020   Renal cyst, left 12/25/2019   Synovitis of right hand 12/25/2019   Radiculitis of right cervical region 10/22/2019   Left midfoot sprain 94/49/6759   Metabolic syndrome 16/38/4665   Fibromyalgia 12/02/2017   Plantar fasciitis of left foot 06/28/2017   Trochanteric bursitis of right hip 06/28/2017   Fatty liver 09/02/2016   Morbid obesity ([place]) 07/21/2015   Intramural leiomyoma of uterus 06/03/2015   Vitamin D deficiency 06/03/2015   Anemia, iron deficiency 06/03/2015   Microcytic anemia 05/07/2015   Essential hypertension, benign 05/06/2015   Ulnar neuropathy of right upper extremity 02/19/2014   Uterine fibroid with menorrhagia and anemia 12/25/2012   Lumbosacral spondylosis 12/19/2012   Back pain  02/17/2011   BMI 40.0-44.9, adult ([place]) 02/17/2011    Past Surgical History:  Procedure Laterality Date   BREAST BIOPSY Right    benign   CARPAL TUNNEL RELEASE Right    CESAREAN SECTION  2000   DILATION AND CURETTAGE OF UTERUS N/A 09/01/2015   Procedure: DILATATION AND CURETTAGE;  Surgeon: Emily Filbert, MD;  Location: [facility];  Service: Gynecology;  Laterality: N/A;   IR GENERIC HISTORICAL  11/18/2015   IR US GUIDE VASC ACCESS RIGHT 11/18/2015 [facility]   IR GENERIC HISTORICAL  11/18/2015   IR ANGIOGRAM SELECTIVE EACH ADDITIONAL VESSEL 11/18/2015 [facility]   IR GENERIC HISTORICAL  11/18/2015   IR EMBO TUMOR ORGAN ISCHEMIA INFARCT INC GUIDE ROADMAPPING 11/18/2015 [facility]   IR GENERIC HISTORICAL  11/18/2015   IR ANGIOGRAM PELVIS SELECTIVE OR SUPRASELECTIVE 11/18/2015 [facility]   IR GENERIC HISTORICAL  11/18/2015   IR ANGIOGRAM SELECTIVE EACH ADDITIONAL VESSEL 11/18/2015 [facility]   IR GENERIC HISTORICAL  11/18/2015   IR ANGIOGRAM PELVIS SELECTIVE OR SUPRASELECTIVE 11/18/2015 [facility]   IR GENERIC HISTORICAL  02/23/2016   IR RADIOLOGIST EVAL & MGMT 02/23/2016 Jacqulynn Cadet, MD [facility]   IR GENERIC HISTORICAL  12/02/2015   IR RADIOLOGIST EVAL & MGMT 12/02/2015 Greggory Keen, MD [facility]   IR RADIOLOGIST EVAL & MGMT  08/24/2016   WISDOM TOOTH EXTRACTION      OB History  Gravida  1   Para  1   Term      Preterm      AB      Living  1      SAB      IAB      Ectopic      Multiple      Live Births               Home Medications    Prior to Admission medications   Medication Sig Start Date End Date Taking? Authorizing Provider  DULoxetine (CYMBALTA) 30 MG capsule Take 30 mg by mouth daily.   Yes [provider]  cetirizine (ZYRTEC) 10 MG chewable tablet Chew 10 mg by mouth daily.    [provider]  diclofenac (VOLTAREN) 75 MG EC tablet Take 1 tablet (75 mg total) by mouth 2 (two) times daily.  08/11/21 08/11/22  Silverio Decamp, MD  diphenhydrAMINE (BENADRYL ALLERGY) 25 mg capsule Take 25 mg by mouth every 6 (six) hours as needed.    [provider]  FLUoxetine (PROZAC) 10 MG capsule TAKE 1 CAPSULE(10 MG) BY MOUTH DAILY 08/12/21   Hali Marry, MD  gabapentin (NEURONTIN) 800 MG tablet TAKE 1 TABLET BY MOUTH IN THE MORNING, 1 TABLET IN THE MIDDAY, 2 TABLETS IN THE EVENING 08/11/21   Silverio Decamp, MD  ketoconazole (NIZORAL) 2 % cream Apply 1 application topically daily. 04/29/21   Hali Marry, MD  lisinopril-hydrochlorothiazide (ZESTORETIC) 20-12.5 MG tablet Take 1 tablet by mouth daily. 04/30/21   Hali Marry, MD  meclizine (ANTIVERT) 25 MG tablet Take 1 tablet (25 mg total) by mouth 3 (three) times daily as needed for dizziness. 05/21/21   Hali Marry, MD  Olopatadine HCl (PATADAY) 0.2 % SOLN Apply 1 drop to eye daily. 08/20/21   Hali Marry, MD  ondansetron (ZOFRAN ODT) 4 MG disintegrating tablet Take 1 tablet (4 mg total) by mouth every 8 (eight) hours as needed for nausea or vomiting. 04/30/21   Hali Marry, MD  Polyethyl Glycol-Propyl Glycol 0.4-0.3 % SOLN Apply to eye.    [provider]  traMADol (ULTRAM) 50 MG tablet Take 1-2 tablets (50-100 mg total) by mouth every 8 (eight) hours as needed for moderate pain. Maximum 6 tabs per day. 04/13/21   Silverio Decamp, MD  traMADol (ULTRAM) 50 MG tablet Take 1 tablet (50 mg total) by mouth every 8 (eight) hours as needed for moderate pain. 08/11/21   Silverio Decamp, MD    Family History Family History  Problem Relation Age of Onset   Lung cancer Mother    Hypertension Mother    Diabetes Father    Melanoma Father    Diabetes Brother    Obesity Brother    Hypertension Other     Social History Social History   Tobacco Use   Smoking status: Former    Packs/day: 0.50    Years: 15.00    Total pack years: 7.50    Types: Cigarettes     Quit date: 04/20/2002    Years since quitting: 19.5   Smokeless tobacco: Never  Vaping Use   Vaping Use: Never used  Substance Use Topics   Alcohol use: Yes    Comment: occ   Drug use: No     Allergies   Patient has no active allergies.   Review of Systems Review of Systems  Constitutional:  Positive for fever.  Gastrointestinal:  Positive for  abdominal pain.  All other systems reviewed and are negative.    Physical Exam Triage Vital Signs ED Triage Vitals  Enc Vitals Group     BP 11/18/21 1822 (!) 173/97     Pulse Rate 11/18/21 1822 93     Resp 11/18/21 1822 20     Temp 11/18/21 1822 100 F (37.8 C)     Temp Source 11/18/21 1822 Oral     SpO2 11/18/21 1822 96 %     Weight 11/18/21 1815 240 lb (108.9 kg)     Height 11/18/21 1815 '5\' 3"'$  (1.6 m)     Head Circumference --      Peak Flow --      Pain Score 11/18/21 1813 7     Pain Loc --      Pain Edu? --      Excl. in [place]? --    No data found.  Updated Vital Signs BP 135/85 (BP Location: Right Arm)   Pulse 93   Temp 100 F (37.8 C) (Oral)   Resp 20   Ht '5\' 3"'$  (1.6 m)   Wt 240 lb (108.9 kg)   LMP 11/06/2021 (Exact Date)   SpO2 96%   BMI 42.51 kg/m    Physical Exam Vitals and nursing note reviewed.  Constitutional:      General: She is not in acute distress.    Appearance: She is well-developed. She is obese. She is not ill-appearing.  HENT:     Head: Normocephalic and atraumatic.     Mouth/Throat:     Mouth: Mucous membranes are moist.     Pharynx: Oropharynx is clear.  Eyes:     Extraocular Movements: Extraocular movements intact.     Pupils: Pupils are equal, round, and reactive to light.  Cardiovascular:     Rate and Rhythm: Normal rate and regular rhythm.     Heart sounds: Normal heart sounds.  Pulmonary:     Effort: Pulmonary effort is normal.     Breath sounds: Normal breath sounds.  Abdominal:     General: Bowel sounds are absent. There is no distension.     Palpations: Abdomen is  rigid. There is no hepatomegaly, splenomegaly or mass.     Tenderness: There is abdominal tenderness in the left upper quadrant and left lower quadrant. There is no right CVA tenderness, left CVA tenderness, guarding or rebound. Negative signs include Murphy's sign, Rovsing's sign and McBurney's sign.     Hernia: No hernia is present.  Skin:    General: Skin is warm and dry.  Neurological:     General: No focal deficit present.     Mental Status: She is alert and oriented to person, place, and time.      UC Treatments / Results  Labs (all labs ordered are listed, but only abnormal results are displayed) Labs Reviewed  POCT URINALYSIS DIP (MANUAL ENTRY) - Abnormal; Notable for the following components:      Result Value   Color, UA light yellow (*)    Blood, UA small (*)    All other components within normal limits  URINE CULTURE    EKG   Radiology No results found.  Procedures Procedures (including critical care time)  Medications Ordered in UC Medications - No data to display  Initial Impression / Assessment and Plan / UC Course  I have reviewed the triage vital signs and the nursing notes.  Pertinent labs & imaging results that were available during my care of  the patient were reviewed by me and considered in my medical decision making (see chart for details).    MDM: 1. LLQ Abdominal Pain-Advised patient to go to [facility] ED now for further evaluation of left lower quadrant abdominal pain.  Advised CT of abdomen and pelvis with contrast is needed to rule out diverticulitis and/or pyelonephritis given current fever.  Patient agreed and verbalized understanding of these instructions and this plan of care this evening.  Final Clinical Impressions(s) / UC Diagnoses   Final diagnoses:  LLQ abdominal pain  Fever, unspecified     Discharge Instructions      Advised patient to go to [facility] ED now for further  evaluation of left lower quadrant abdominal pain.  Advised CT of abdomen and pelvis with contrast is needed to rule out diverticulitis and/or pyelonephritis given current fever.     ED Prescriptions   None    PDMP not reviewed this encounter.   Eliezer Lofts, [place] 11/18/21 1858

## 2021-11-18 NOTE — ED Triage Notes (Signed)
  Patient comes in with L sided abdominal pain that started yesterday morning.  Patient states she woke up yesterday with the pain and it has not gotten better.  Patient states she had diarrhea all day yesterday but no BMs today.  Denies any N/V.  No chest pain or SOB.  Patient had 975 mg tylenol at 1400.  Pain 8/10, throbbing/squeezing pain.

## 2021-11-18 NOTE — Discharge Instructions (Addendum)
Advised patient to go to [facility] ED now for further evaluation of left lower quadrant abdominal pain.  Advised CT of abdomen and pelvis with contrast is needed to rule out diverticulitis and/or pyelonephritis given current fever.

## 2021-11-18 NOTE — Discharge Instructions (Signed)
Take 4 over the counter ibuprofen tablets 3 times a day or 2 over-the-counter naproxen tablets twice a day for pain. Also take tylenol '1000mg'$ (2 extra strength) four times a day.     Return for worsening abdominal pain fever or inability to eat or drink.

## 2021-11-18 NOTE — ED Provider Notes (Signed)
[facility] EMERGENCY DEPARTMENT Provider Note   CSN: [id number] Arrival date & time: 11/18/21  1915     History  Chief Complaint  Patient presents with   Abdominal Pain    Elizabeth Copeland is a 51 y.o. female.  51 yo F with a chief complaint of left lower quadrant abdominal pain.  Been going on for a few days.  Having some diarrhea with it.  Not dark or bloody as far she can tell.  Some nausea but no vomiting.  Had a fever at urgent care when she went earlier today.  She denies any urinary symptoms.  I spoke with a representative at the urgent care who let me know that she was coming here to have a CT scan obtained.   Abdominal Pain      Home Medications Prior to Admission medications   Medication Sig Start Date End Date Taking? Authorizing Provider  cetirizine (ZYRTEC) 10 MG chewable tablet Chew 10 mg by mouth daily.    [provider]  diclofenac (VOLTAREN) 75 MG EC tablet Take 1 tablet (75 mg total) by mouth 2 (two) times daily. 08/11/21 08/11/22  Silverio Decamp, MD  diphenhydrAMINE (BENADRYL ALLERGY) 25 mg capsule Take 25 mg by mouth every 6 (six) hours as needed.    [provider]  DULoxetine (CYMBALTA) 30 MG capsule Take 30 mg by mouth daily.    [provider]  FLUoxetine (PROZAC) 10 MG capsule TAKE 1 CAPSULE(10 MG) BY MOUTH DAILY 08/12/21   Hali Marry, MD  gabapentin (NEURONTIN) 800 MG tablet TAKE 1 TABLET BY MOUTH IN THE MORNING, 1 TABLET IN THE MIDDAY, 2 TABLETS IN THE EVENING 08/11/21   Silverio Decamp, MD  ketoconazole (NIZORAL) 2 % cream Apply 1 application topically daily. 04/29/21   Hali Marry, MD  lisinopril-hydrochlorothiazide (ZESTORETIC) 20-12.5 MG tablet Take 1 tablet by mouth daily. 04/30/21   Hali Marry, MD  meclizine (ANTIVERT) 25 MG tablet Take 1 tablet (25 mg total) by mouth 3 (three) times daily as needed for dizziness. 05/21/21   Hali Marry, MD  Olopatadine HCl  (PATADAY) 0.2 % SOLN Apply 1 drop to eye daily. 08/20/21   Hali Marry, MD  ondansetron (ZOFRAN ODT) 4 MG disintegrating tablet Take 1 tablet (4 mg total) by mouth every 8 (eight) hours as needed for nausea or vomiting. 04/30/21   Hali Marry, MD  Polyethyl Glycol-Propyl Glycol 0.4-0.3 % SOLN Apply to eye.    [provider]  traMADol (ULTRAM) 50 MG tablet Take 1-2 tablets (50-100 mg total) by mouth every 8 (eight) hours as needed for moderate pain. Maximum 6 tabs per day. 04/13/21   Silverio Decamp, MD  traMADol (ULTRAM) 50 MG tablet Take 1 tablet (50 mg total) by mouth every 8 (eight) hours as needed for moderate pain. 08/11/21   Silverio Decamp, MD      Allergies    Patient has no known allergies.    Review of Systems   Review of Systems  Gastrointestinal:  Positive for abdominal pain.    Physical Exam Updated Vital Signs BP (!) 144/92   Pulse 82   Temp 99 F (37.2 C) (Oral)   Resp 17   Ht '5\' 3"'$  (1.6 m)   Wt 108.9 kg   LMP 11/06/2021 (Exact Date)   SpO2 97%   BMI 42.51 kg/m  Physical Exam Vitals and nursing note reviewed.  Constitutional:      General: She  is not in acute distress.    Appearance: She is well-developed. She is not diaphoretic.  HENT:     Head: Normocephalic and atraumatic.  Eyes:     Pupils: Pupils are equal, round, and reactive to light.  Cardiovascular:     Rate and Rhythm: Normal rate and regular rhythm.     Heart sounds: No murmur heard.    No friction rub. No gallop.  Pulmonary:     Effort: Pulmonary effort is normal.     Breath sounds: No wheezing or rales.  Abdominal:     General: There is no distension.     Palpations: Abdomen is soft.     Tenderness: There is abdominal tenderness.     Comments: Pain worse in the left lower quadrant.  Musculoskeletal:        General: No tenderness.     Cervical back: Normal range of motion and neck supple.  Skin:    General: Skin is warm and dry.  Neurological:      Mental Status: She is alert and oriented to person, place, and time.  Psychiatric:        Behavior: Behavior normal.     ED Results / Procedures / Treatments   Labs (all labs ordered are listed, but only abnormal results are displayed) Labs Reviewed  COMPREHENSIVE METABOLIC PANEL - Abnormal; Notable for the following components:      Result Value   Sodium 134 (*)    Glucose, Bld 101 (*)    All other components within normal limits  CBC - Abnormal; Notable for the following components:   WBC 12.9 (*)    All other components within normal limits  URINALYSIS, ROUTINE W REFLEX MICROSCOPIC - Abnormal; Notable for the following components:   Hgb urine dipstick TRACE (*)    All other components within normal limits  LIPASE, BLOOD  PREGNANCY, URINE  URINALYSIS, MICROSCOPIC (REFLEX)    EKG None  Radiology CT ABDOMEN PELVIS W CONTRAST  Result Date: 11/18/2021 CLINICAL DATA:  Left lower quadrant abdominal pain EXAM: CT ABDOMEN AND PELVIS WITH CONTRAST TECHNIQUE: Multidetector CT imaging of the abdomen and pelvis was performed using the standard protocol following bolus administration of intravenous contrast. RADIATION DOSE REDUCTION: This exam was performed according to the departmental dose-optimization program which includes automated exposure control, adjustment of the mA and/or kV according to patient size and/or use of iterative reconstruction technique. CONTRAST:  162m OMNIPAQUE IOHEXOL 300 MG/ML  SOLN COMPARISON:  CT abdomen and pelvis 09/01/2016 FINDINGS: Lower chest: No acute abnormality. Hepatobiliary: Hepatic steatosis. No suspicious focal liver abnormality is seen. No gallstones, gallbladder wall thickening, or biliary dilatation. Pancreas: Unremarkable. No pancreatic ductal dilatation or surrounding inflammatory changes. Spleen: Normal in size without focal abnormality. Adrenals/Urinary Tract: Adrenal glands are unremarkable. Kidneys are normal, without renal calculi, suspicious  focal lesion, or hydronephrosis. Bilateral simple renal cysts. No follow-up is required. bladder is unremarkable. Colonic diverticulosis without diverticulitis. Stomach/Bowel: Stomach is within normal limits. The appendix is not visualized. No evidence of bowel wall thickening, distention, or inflammatory changes. Vascular/Lymphatic: No significant vascular findings are present. No enlarged abdominal or pelvic lymph nodes. Reproductive: Multiple uterine fibroids. Other: No free intraperitoneal fluid or air. Musculoskeletal: No acute or significant osseous findings. IMPRESSION: No acute abnormality. Diffuse hepatic steatosis. Sigmoid diverticulosis. Uterine fibroids. Electronically Signed   By: TPlacido SouM.D.   On: 11/18/2021 21:39    Procedures Procedures    Medications Ordered in ED Medications  morphine (PF) 4 MG/ML injection  4 mg (0 mg Intravenous Hold 11/18/21 2047)  ondansetron (ZOFRAN) injection 4 mg (0 mg Intravenous Hold 11/18/21 2047)  ketorolac (TORADOL) 15 MG/ML injection 15 mg (has no administration in time range)  sodium chloride 0.9 % bolus 1,000 mL (1,000 mLs Intravenous New Bag/Given 11/18/21 2052)  iohexol (OMNIPAQUE) 300 MG/ML solution 125 mL (125 mLs Intravenous Contrast Given 11/18/21 2116)    ED Course/ Medical Decision Making/ A&P                           Medical Decision Making Amount and/or Complexity of Data Reviewed Labs: ordered. Radiology: ordered.  Risk Prescription drug management.   51 yo F with a chief complaints of abdominal pain.  Going on for couple days.  Associate with diarrhea.  Was febrile at urgent care no noted fevers at home.  Most likely the patient has diverticulitis by history and physical.  I discussed with her the most recent guidelines that suggest starting antibiotics.  As the patient is describing severe discomfort we will obtain a CT scan to assess for other possible pathology.  We will treat pain and nausea here.  Lab work UA  reassess.  UA negative for infection there was trace blood on dipstick but no blood cells seen on microscopic.  No significant electrolyte abnormality very mild leukocytosis.  CT scan of the abdomen pelvis without obvious pathology in the left lower quadrant is independently interpreted by me.  I discussed results with the patient.  We will have her follow-up with her family doctor in the office.  Trial of Tylenol NSAIDs and Imodium.  10:14 PM:  I have discussed the diagnosis/risks/treatment options with the patient.  Evaluation and diagnostic testing in the emergency department does not suggest an emergent condition requiring admission or immediate intervention beyond what has been performed at this time.  They will follow up with  PCP. We also discussed returning to the ED immediately if new or worsening sx occur. We discussed the sx which are most concerning (e.g., sudden worsening pain, fever, inability to tolerate by mouth) that necessitate immediate return. Medications administered to the patient during their visit and any new prescriptions provided to the patient are listed below.  Medications given during this visit Medications  morphine (PF) 4 MG/ML injection 4 mg (0 mg Intravenous Hold 11/18/21 2047)  ondansetron (ZOFRAN) injection 4 mg (0 mg Intravenous Hold 11/18/21 2047)  ketorolac (TORADOL) 15 MG/ML injection 15 mg (has no administration in time range)  sodium chloride 0.9 % bolus 1,000 mL (1,000 mLs Intravenous New Bag/Given 11/18/21 2052)  iohexol (OMNIPAQUE) 300 MG/ML solution 125 mL (125 mLs Intravenous Contrast Given 11/18/21 2116)     The patient appears reasonably screen and/or stabilized for discharge and I doubt any other medical condition or other [facility] requiring further screening, evaluation, or treatment in the ED at this time prior to discharge.          Final Clinical Impression(s) / ED Diagnoses Final diagnoses:  Left lower quadrant abdominal pain  Diarrhea,  unspecified type    Rx / DC Orders ED Discharge Orders     None         Deno Etienne, DO 11/18/21 2214

## 2021-11-18 NOTE — ED Notes (Signed)
Patient is being discharged from the Urgent Care and sent to the Emergency Department via private vehicle . Per Eliezer Lofts, patient is in need of higher level of care due to further evalution. Patient is aware and verbalizes understanding of plan of care.  Vitals:   11/18/21 1822 11/18/21 1823  BP: (!) 173/97 135/85  Pulse: 93   Resp: 20   Temp: 100 F (37.8 C)   SpO2: 96%

## 2021-11-18 NOTE — ED Triage Notes (Signed)
Pt presents to Urgent Care with c/o abdominal pain and back pain since yesterday. States she had diarrhea yesterday but this has subsided. No urinary symptoms or hx of kidney stones.

## 2021-12-01 DIAGNOSIS — F4310 Post-traumatic stress disorder, unspecified: Secondary | ICD-10-CM | POA: Diagnosis not present

## 2021-12-13 ENCOUNTER — Encounter: Payer: Self-pay | Admitting: Obstetrics & Gynecology

## 2021-12-13 ENCOUNTER — Ambulatory Visit: Payer: BC Managed Care – PPO | Admitting: Obstetrics & Gynecology

## 2021-12-13 VITALS — BP 132/87 | HR 81 | Ht 63.0 in | Wt 252.0 lb

## 2021-12-13 DIAGNOSIS — N926 Irregular menstruation, unspecified: Secondary | ICD-10-CM | POA: Diagnosis not present

## 2021-12-13 DIAGNOSIS — N8189 Other female genital prolapse: Secondary | ICD-10-CM

## 2021-12-13 DIAGNOSIS — D259 Leiomyoma of uterus, unspecified: Secondary | ICD-10-CM

## 2021-12-13 DIAGNOSIS — R195 Other fecal abnormalities: Secondary | ICD-10-CM | POA: Diagnosis not present

## 2021-12-13 NOTE — Progress Notes (Signed)
Subjective:    Patient ID: Elizabeth Copeland, female    DOB: 06/05/1970, 51 y.o.   MRN: [id number]  HPI  51 yo female presents for f/u of left sided pain.  Wa seen at [facility] and had CT.   No N/D.  Always feels like she is "pooping her brains out"  Loose BMs once a day and they are looser than normal.  Nml colonoscopy normal 1-2 years ago.  Had pelvic US 1 year ago with several 3 cm fibroids, mostly on the right side.   CT ABDOMEN PELVIS W CONTRAST   Result Date: 11/18/2021 CLINICAL DATA:  Left lower quadrant abdominal pain EXAM: CT ABDOMEN AND PELVIS WITH CONTRAST TECHNIQUE: Multidetector CT imaging of the abdomen and pelvis was performed using the standard protocol following bolus administration of intravenous contrast. RADIATION DOSE REDUCTION: This exam was performed according to the departmental dose-optimization program which includes automated exposure control, adjustment of the mA and/or kV according to patient size and/or use of iterative reconstruction technique. CONTRAST:  129m OMNIPAQUE IOHEXOL 300 MG/ML  SOLN COMPARISON:  CT abdomen and pelvis 09/01/2016 FINDINGS: Lower chest: No acute abnormality. Hepatobiliary: Hepatic steatosis. No suspicious focal liver abnormality is seen. No gallstones, gallbladder wall thickening, or biliary dilatation. Pancreas: Unremarkable. No pancreatic ductal dilatation or surrounding inflammatory changes. Spleen: Normal in size without focal abnormality. Adrenals/Urinary Tract: Adrenal glands are unremarkable. Kidneys are normal, without renal calculi, suspicious focal lesion, or hydronephrosis. Bilateral simple renal cysts. No follow-up is required. bladder is unremarkable. Colonic diverticulosis without diverticulitis. Stomach/Bowel: Stomach is within normal limits. The appendix is not visualized. No evidence of bowel wall thickening, distention, or inflammatory changes. Vascular/Lymphatic: No significant vascular findings are present. No enlarged abdominal or  pelvic lymph nodes. Reproductive: Multiple uterine fibroids. Other: No free intraperitoneal fluid or air. Musculoskeletal: No acute or significant osseous findings. IMPRESSION: No acute abnormality. Diffuse hepatic steatosis. Sigmoid diverticulosis. Uterine fibroids. Electronically Signed   By: TPlacido SouM.D.   On: 11/18/2021 21:39   Review of Systems  Constitutional: Negative.   Respiratory: Negative.    Cardiovascular: Negative.   Gastrointestinal:  Positive for diarrhea. Negative for nausea and vomiting.       Bloating.  Genitourinary:  Positive for menstrual problem and pelvic pain. Negative for vaginal discharge.  Skin: Negative.        Objective:   Physical Exam Vitals reviewed.  Constitutional:      General: She is not in acute distress.    Appearance: She is well-developed.  HENT:     Head: Normocephalic and atraumatic.  Eyes:     Conjunctiva/sclera: Conjunctivae normal.  Cardiovascular:     Rate and Rhythm: Normal rate.  Pulmonary:     Effort: Pulmonary effort is normal.  Abdominal:     General: Abdomen is flat. There is no distension.     Palpations: Abdomen is soft. There is no mass.  Genitourinary:    Comments: Tanner V Vulva:  No lesion Cervix:  No CMT Uterus:  Enlarged and tender Right adnexa--non tender, no mass, difficult due to habitus Left adnexa--non tender, no mass, difficult due to habitus Large stool burden felt, with stool in rectal vault pushing into vagina.    Skin:    General: Skin is warm and dry.  Neurological:     Mental Status: She is alert and oriented to person, place, and time.  Psychiatric:        Mood and Affect: Mood normal.    Vitals:  12/13/21 1426  BP: 132/87  Pulse: 81  Weight: 252 lb (114.3 kg)  Height: '5\' 3"'$  (1.6 m)      Assessment & Plan:  51 yo female with pelvic pain and cramping, enlarged uterus, bowel issues with incomplete emptying, loose BMs.     Uterus enlarged on bimanual--will repeat US. Large stool  burden in rectal vault--unable to feel; no urge for BM;--Pelvic PT and suggest bulking stools with fiber and emptying rectal vault with Dulcolax suppository.  Elevate knees during BM with squatty potty or step.   Irregular length of menstrual cycle--menstrual cycle lasting 10 days with spotting at the end.  Suggst endometrial biopsy.    33 minutes spent with patient--review of records, history and physical, counseling on bowel habits, and documentation.

## 2021-12-16 ENCOUNTER — Encounter: Payer: Self-pay | Admitting: Family Medicine

## 2021-12-16 NOTE — Telephone Encounter (Signed)
Called [email or web address]

## 2021-12-29 ENCOUNTER — Other Ambulatory Visit: Payer: BC Managed Care – PPO

## 2021-12-29 ENCOUNTER — Ambulatory Visit (INDEPENDENT_AMBULATORY_CARE_PROVIDER_SITE_OTHER): Payer: BC Managed Care – PPO

## 2021-12-29 DIAGNOSIS — D259 Leiomyoma of uterus, unspecified: Secondary | ICD-10-CM

## 2022-01-05 ENCOUNTER — Other Ambulatory Visit: Payer: BC Managed Care – PPO | Admitting: Obstetrics & Gynecology

## 2022-01-21 ENCOUNTER — Ambulatory Visit: Payer: BC Managed Care – PPO | Admitting: Sports Medicine

## 2022-01-21 ENCOUNTER — Encounter: Payer: Self-pay | Admitting: Sports Medicine

## 2022-01-21 ENCOUNTER — Ambulatory Visit (INDEPENDENT_AMBULATORY_CARE_PROVIDER_SITE_OTHER): Payer: BC Managed Care – PPO

## 2022-01-21 DIAGNOSIS — M170 Bilateral primary osteoarthritis of knee: Secondary | ICD-10-CM

## 2022-01-21 NOTE — Progress Notes (Signed)
    Procedures performed today:    Procedure: Real-time Ultrasound Guided injection of the right knee Device: Samsung HS60  Verbal informed consent obtained.  Time-out conducted.  Noted no overlying erythema, induration, or other signs of local infection.  Skin prepped in a sterile fashion.  Local anesthesia: Topical Ethyl chloride.  With sterile technique and under real time ultrasound guidance: Mild effusion noted, 1 cc Kenalog 40, 2 cc lidocaine, 2 cc bupivacaine injected easily Completed without difficulty  Advised to call if fevers/chills, erythema, induration, drainage, or persistent bleeding.  Images permanently stored and available for review in PACS.  Impression: Technically successful ultrasound guided injection.  Independent interpretation of notes and tests performed by another provider:   None.  Brief History, Exam, Impression, and Recommendations:    Primary osteoarthritis of both knees This is a very pleasant 51 year old female, bilateral knee osteoarthritis, left knee was injected back in June, she did really well, right knee is having some pain as well, mostly medial joint line. Today we injected her knee, we are also going to get her into formal physical therapy, I would like her to work aggressively on weight loss with her PCP as well.    ____________________________________________ Gwen Her. Dianah Field, M.D., ABFM., CAQSM., AME. Primary Care and Sports Medicine [facility]  Adjunct Professor of [employer]  Risk manager

## 2022-01-21 NOTE — Assessment & Plan Note (Signed)
This is a very pleasant 51 year old female, bilateral knee osteoarthritis, left knee was injected back in June, she did really well, right knee is having some pain as well, mostly medial joint line. Today we injected her knee, we are also going to get her into formal physical therapy, I would like her to work aggressively on weight loss with her PCP as well.

## 2022-01-25 ENCOUNTER — Ambulatory Visit: Payer: BC Managed Care – PPO | Admitting: Obstetrics and Gynecology

## 2022-01-25 ENCOUNTER — Other Ambulatory Visit (HOSPITAL_COMMUNITY)
Admission: RE | Admit: 2022-01-25 | Discharge: 2022-01-25 | Disposition: A | Discharge status: Home / Self Care | Payer: BC Managed Care – PPO | Source: Ambulatory Visit | Attending: Obstetrics and Gynecology | Admitting: Obstetrics and Gynecology

## 2022-01-25 ENCOUNTER — Encounter: Payer: Self-pay | Admitting: Obstetrics and Gynecology

## 2022-01-25 VITALS — BP 135/83 | HR 71 | Ht 63.0 in | Wt 249.0 lb

## 2022-01-25 DIAGNOSIS — N898 Other specified noninflammatory disorders of vagina: Secondary | ICD-10-CM | POA: Diagnosis not present

## 2022-01-25 MED ORDER — CLOTRIMAZOLE-BETAMETHASONE 1-0.05 % EX CREA: 1.0000 | TOPICAL_CREAM | Freq: Two times a day (BID) | CUTANEOUS | 0 refills | Status: DC

## 2022-01-25 MED ORDER — FLUCONAZOLE 200 MG PO TABS: 200.0000 mg | ORAL_TABLET | Freq: Every day | ORAL | 1 refills | Status: DC

## 2022-01-26 LAB — CERVICOVAGINAL ANCILLARY ONLY
Bacterial Vaginitis (gardnerella): POSITIVE — AB
Candida Glabrata: NEGATIVE
Candida Vaginitis: NEGATIVE
Comment: NEGATIVE
Comment: NEGATIVE
Comment: NEGATIVE

## 2022-01-27 ENCOUNTER — Ambulatory Visit: Payer: BC Managed Care – PPO | Admitting: Obstetrics and Gynecology

## 2022-01-27 ENCOUNTER — Encounter: Payer: Self-pay | Admitting: Obstetrics and Gynecology

## 2022-01-27 ENCOUNTER — Ambulatory Visit: Payer: BC Managed Care – PPO | Admitting: Sports Medicine

## 2022-01-27 VITALS — Ht 63.0 in | Wt 249.0 lb

## 2022-01-27 DIAGNOSIS — N760 Acute vaginitis: Secondary | ICD-10-CM | POA: Diagnosis not present

## 2022-01-27 MED ORDER — METRONIDAZOLE 500 MG PO TABS: 500.0000 mg | ORAL_TABLET | Freq: Two times a day (BID) | ORAL | 0 refills | Status: DC

## 2022-01-27 NOTE — Progress Notes (Signed)
GYNECOLOGY ENCOUNTER NOTE  History:     Elizabeth Copeland is a 51 y.o. G1P1 female here with complaints of vaginal irritation for a few days. She reports itching and irritation. She tried over the counter vagicil for a few days and the symptoms worsen. She reports a watery discharge without odor.    Obstetric History OB History  Gravida Para Term Preterm AB Living  '1 1       1  '$ SAB IAB Ectopic Multiple Live Births               # Outcome Date GA Lbr Len/2nd Weight Sex Delivery Anes PTL Lv  1 Para 01/01/99 [redacted]w[redacted]d          Past Medical History:  Diagnosis Date   Anemia    Back pain    Complication of anesthesia    patient has not received anesthesia before but states father and brother have a hard time waking up from anesthesia   Family history of adverse reaction to anesthesia    mother "has a hard time waking up and moody"    Fibroid    Headache    Hypertension    Morbid obesity ([place]    Pneumonia     Past Surgical History:  Procedure Laterality Date   BREAST BIOPSY Right    benign   CARPAL TUNNEL RELEASE Right    CESAREAN SECTION  2000   DILATION AND CURETTAGE OF UTERUS N/A 09/01/2015   Procedure: DILATATION AND CURETTAGE;  Surgeon: MEmily Filbert MD;  [facility];  Service: Gynecology;  Laterality: N/A;   IR GENERIC HISTORICAL  11/18/2015   IR [place] VASC ACCESS RIGHT 11/18/2015 [facility]   IR GENERIC HISTORICAL  11/18/2015   IR ANGIOGRAM SELECTIVE EACH ADDITIONAL VESSEL 11/18/2015 [facility]   IR GENERIC HISTORICAL  11/18/2015   IR EMBO TUMOR ORGAN ISCHEMIA INFARCT INC GUIDE ROADMAPPING 11/18/2015 [facility]   IR GENERIC HISTORICAL  11/18/2015   IR ANGIOGRAM PELVIS SELECTIVE OR SUPRASELECTIVE 11/18/2015 [facility]   IR GENERIC HISTORICAL  11/18/2015   IR ANGIOGRAM SELECTIVE EACH ADDITIONAL VESSEL 11/18/2015 [facility]   IR GENERIC HISTORICAL  11/18/2015   IR ANGIOGRAM PELVIS SELECTIVE OR SUPRASELECTIVE 11/18/2015 [facility]   IR GENERIC  HISTORICAL  02/23/2016   IR RADIOLOGIST EVAL & MGMT 02/23/2016 HJacqulynn Cadet MD [facility]   IR GENERIC HISTORICAL  12/02/2015   IR RADIOLOGIST EVAL & MGMT 12/02/2015 MGreggory Keen MD [facility]   IR RADIOLOGIST EVAL & MGMT  08/24/2016   WISDOM TOOTH EXTRACTION      Current Outpatient Medications on File Prior to Visit  Medication Sig Dispense Refill   cetirizine (ZYRTEC) 10 MG chewable tablet Chew 10 mg by mouth daily.     diphenhydrAMINE (BENADRYL ALLERGY) 25 mg capsule Take 25 mg by mouth every 6 (six) hours as needed.     DULoxetine (CYMBALTA) 30 MG capsule Take 30 mg by mouth daily.     gabapentin (NEURONTIN) 800 MG tablet TAKE 1 TABLET BY MOUTH IN THE MORNING, 1 TABLET IN THE MIDDAY, 2 TABLETS IN THE EVENING 360 tablet 3   ketoconazole (NIZORAL) 2 % cream Apply 1 application topically daily. 30 g 1   lisinopril-hydrochlorothiazide (ZESTORETIC) 20-12.5 MG tablet Take 1 tablet by mouth daily. 90 tablet 2   meclizine (ANTIVERT) 25 MG tablet Take 1 tablet (25 mg total) by mouth 3 (three) times daily as needed for dizziness. 90 tablet 0  Olopatadine HCl (PATADAY) 0.2 % SOLN Apply 1 drop to eye daily. 2.5 mL 1   ondansetron (ZOFRAN ODT) 4 MG disintegrating tablet Take 1 tablet (4 mg total) by mouth every 8 (eight) hours as needed for nausea or vomiting. 20 tablet 0   traMADol (ULTRAM) 50 MG tablet Take 1-2 tablets (50-100 mg total) by mouth every 8 (eight) hours as needed for moderate pain. Maximum 6 tabs per day. 30 tablet 0   traMADol (ULTRAM) 50 MG tablet Take 1 tablet (50 mg total) by mouth every 8 (eight) hours as needed for moderate pain. 21 tablet 0   No current facility-administered medications on file prior to visit.    No Known Allergies  Social History:  reports that she quit smoking about 19 years ago. Her smoking use included cigarettes. She has a 7.50 pack-year smoking history. She has never used smokeless tobacco. She reports current alcohol use. She  reports that she does not use drugs.  Family History  Problem Relation Age of Onset   Lung cancer Mother    Hypertension Mother    Diabetes Father    Melanoma Father    Diabetes Brother    Obesity Brother    Hypertension Other     The following portions of the patient's history were reviewed and updated as appropriate: allergies, current medications, past family history, past medical history, past social history, past surgical history and problem list.  Review of Systems Pertinent items noted in HPI and remainder of comprehensive ROS otherwise negative.  Physical Exam:  BP 135/83   Pulse 71   Ht '5\' 3"'$  (1.6 m)   Wt 249 lb (112.9 kg)   BMI 44.11 kg/m  CONSTITUTIONAL: Well-developed, well-nourished female in no acute distress.  HENT:  Normocephalic NEUROLOGIC: Alert and oriented to person, place, and time.  PSYCHIATRIC: Normal mood and affect.  PELVIC: angry, erythematous vulva. Edema throughout. No lesions. Watery, clear white discharge noted leaking from vagina. No odor    Assessment and Plan:  1. Vaginal irritation  - Cervicovaginal ancillary only( [facility]) - Rx: diflucan, steroid cream  - close follow up - Sitz baths, keep vulva dry - Recommend A1c testing   Shavon Zenz, Artist Pais, NP [place]

## 2022-01-27 NOTE — Progress Notes (Signed)
51 yo P1 returning for follow up on vaginitis. Patient was seen on Tuesday report vaginal pruritis and vulva swelling. She was prescribed diflucan at the end of the visit for presumed yeast infection. Patient report some improvement in the vulva swelling. She has been applying cool compresses.   Past Medical History:  Diagnosis Date   Anemia    Back pain    Complication of anesthesia    patient has not received anesthesia before but states father and brother have a hard time waking up from anesthesia   Family history of adverse reaction to anesthesia    mother "has a hard time waking up and moody"    Fibroid    Headache    Hypertension    Morbid obesity ([place])    Pneumonia    Past Surgical History:  Procedure Laterality Date   BREAST BIOPSY Right    benign   CARPAL TUNNEL RELEASE Right    CESAREAN SECTION  2000   DILATION AND CURETTAGE OF UTERUS N/A 09/01/2015   Procedure: DILATATION AND CURETTAGE;  Surgeon: Emily Filbert, MD;  Location: [facility];  Service: Gynecology;  Laterality: N/A;   IR GENERIC HISTORICAL  11/18/2015   IR US GUIDE VASC ACCESS RIGHT 11/18/2015 [facility]   IR GENERIC HISTORICAL  11/18/2015   IR ANGIOGRAM SELECTIVE EACH ADDITIONAL VESSEL 11/18/2015 [facility]   IR GENERIC HISTORICAL  11/18/2015   IR EMBO TUMOR ORGAN ISCHEMIA INFARCT INC GUIDE ROADMAPPING 11/18/2015 [facility]   IR GENERIC HISTORICAL  11/18/2015   IR ANGIOGRAM PELVIS SELECTIVE OR SUPRASELECTIVE 11/18/2015 [facility]   IR GENERIC HISTORICAL  11/18/2015   IR ANGIOGRAM SELECTIVE EACH ADDITIONAL VESSEL 11/18/2015 [facility]   IR GENERIC HISTORICAL  11/18/2015   IR ANGIOGRAM PELVIS SELECTIVE OR SUPRASELECTIVE 11/18/2015 [facility]   IR GENERIC HISTORICAL  02/23/2016   IR RADIOLOGIST EVAL & MGMT 02/23/2016 Jacqulynn Cadet, MD [facility]   IR GENERIC HISTORICAL  12/02/2015   IR RADIOLOGIST EVAL & MGMT 12/02/2015 Greggory Keen, MD [facility]   IR RADIOLOGIST EVAL & MGMT   08/24/2016   WISDOM TOOTH EXTRACTION     Family History  Problem Relation Age of Onset   Lung cancer Mother    Hypertension Mother    Diabetes Father    Melanoma Father    Diabetes Brother    Obesity Brother    Hypertension Other    Social History   Tobacco Use   Smoking status: Former    Packs/day: 0.50    Years: 15.00    Total pack years: 7.50    Types: Cigarettes    Quit date: 04/20/2002    Years since quitting: 19.7   Smokeless tobacco: Never  Vaping Use   Vaping Use: Never used  Substance Use Topics   Alcohol use: Yes    Comment: occ   Drug use: No   ROS See pertinent in HPI. All other systems reviewed and non contributory Height '5\' 3"'$  (1.6 m), weight 249 lb (112.9 kg). GENERAL: Well-developed, well-nourished female in no acute distress.  HEENT: Normocephalic, atraumatic. Sclerae anicteric.  NECK: Supple. Normal thyroid.  LUNGS: Clear to auscultation bilaterally.  HEART: Regular rate and rhythm. BREASTS: Symmetric in size. No palpable masses or lymphadenopathy, skin changes, or nipple drainage. ABDOMEN: Soft, nontender, nondistended. No organomegaly. PELVIC: No erythema noted on vulva. Labia slightly swollen but seems improved compared to 10/24 exam. Vaginal bleeding noted from cervical os. Chaperone present during the pelvic exam EXTREMITIES: No  cyanosis, clubbing, or edema, 2+ distal pulses.  A/P 51 yo with vaginitis - Reviewed vaginal swab results and diagnosis of BV- Rx flagyl e-prescribed - Patient advised to contact the office on Monday if no improvement in swelling - Continue sitz bath and cool compresses for relief - Patient advised to keep a menstrual calendar to assess AUB- precautions reviewed

## 2022-01-31 ENCOUNTER — Encounter: Payer: Self-pay | Admitting: Physical Therapy

## 2022-01-31 ENCOUNTER — Other Ambulatory Visit: Payer: Self-pay

## 2022-01-31 ENCOUNTER — Ambulatory Visit: Payer: BC Managed Care – PPO | Attending: Sports Medicine | Admitting: Physical Therapy

## 2022-01-31 DIAGNOSIS — M6281 Muscle weakness (generalized): Secondary | ICD-10-CM

## 2022-01-31 DIAGNOSIS — G8929 Other chronic pain: Secondary | ICD-10-CM

## 2022-01-31 DIAGNOSIS — M170 Bilateral primary osteoarthritis of knee: Secondary | ICD-10-CM | POA: Diagnosis not present

## 2022-01-31 DIAGNOSIS — R2689 Other abnormalities of gait and mobility: Secondary | ICD-10-CM

## 2022-01-31 DIAGNOSIS — M25661 Stiffness of right knee, not elsewhere classified: Secondary | ICD-10-CM

## 2022-01-31 NOTE — Therapy (Signed)
OUTPATIENT PHYSICAL THERAPY LOWER EXTREMITY EVALUATION   Patient Name: Elizabeth Copeland MRN: [id number] DOB:January 16, 1971, 51 y.o., female Today's Date: 01/31/2022   PT End of Session - 01/31/22 0721     Visit Number 1    Number of Visits 8    Date for PT Re-Evaluation 03/28/22    Authorization Type BCBS    PT Start Time 0715    PT Stop Time 0800    PT Time Calculation (min) 45 min    Activity Tolerance Patient tolerated treatment well    Behavior During Therapy WFL for tasks assessed/performed             Past Medical History:  Diagnosis Date   Anemia    Back pain    Complication of anesthesia    patient has not received anesthesia before but states father and brother have a hard time waking up from anesthesia   Family history of adverse reaction to anesthesia    mother "has a hard time waking up and moody"    Fibroid    Headache    Hypertension    Morbid obesity ([place])    Pneumonia    Past Surgical History:  Procedure Laterality Date   BREAST BIOPSY Right    benign   CARPAL TUNNEL RELEASE Right    CESAREAN SECTION  2000   DILATION AND CURETTAGE OF UTERUS N/A 09/01/2015   Procedure: DILATATION AND CURETTAGE;  Surgeon: Emily Filbert, MD;  Location: [facility];  Service: Gynecology;  Laterality: N/A;   IR GENERIC HISTORICAL  11/18/2015   IR US GUIDE VASC ACCESS RIGHT 11/18/2015 [facility]   IR GENERIC HISTORICAL  11/18/2015   IR ANGIOGRAM SELECTIVE EACH ADDITIONAL VESSEL 11/18/2015 [facility]   IR GENERIC HISTORICAL  11/18/2015   IR EMBO TUMOR ORGAN ISCHEMIA INFARCT INC GUIDE ROADMAPPING 11/18/2015 [facility]   IR GENERIC HISTORICAL  11/18/2015   IR ANGIOGRAM PELVIS SELECTIVE OR SUPRASELECTIVE 11/18/2015 [facility]   IR GENERIC HISTORICAL  11/18/2015   IR ANGIOGRAM SELECTIVE EACH ADDITIONAL VESSEL 11/18/2015 [facility]   IR GENERIC HISTORICAL  11/18/2015   IR ANGIOGRAM PELVIS SELECTIVE OR SUPRASELECTIVE 11/18/2015 [facility]   IR GENERIC HISTORICAL   02/23/2016   IR RADIOLOGIST EVAL & MGMT 02/23/2016 Jacqulynn Cadet, MD [facility]   IR GENERIC HISTORICAL  12/02/2015   IR RADIOLOGIST EVAL & MGMT 12/02/2015 Greggory Keen, MD [facility]   IR RADIOLOGIST EVAL & MGMT  08/24/2016   WISDOM TOOTH EXTRACTION     Patient Active Problem List   Diagnosis Date Noted   Primary osteoarthritis of both knees 08/11/2021   Vertigo 06/09/2021   Carpal tunnel syndrome, bilateral 07/31/2020   Situational mixed anxiety and depressive disorder 06/24/2020   Renal cyst, left 12/25/2019   Synovitis of right hand 12/25/2019   Radiculitis of right cervical region 23/55/7322   Metabolic syndrome 02/54/2706   Fibromyalgia 12/02/2017   Plantar fasciitis of left foot 06/28/2017   Trochanteric bursitis of right hip 06/28/2017   Fatty liver 09/02/2016   Intramural leiomyoma of uterus 06/03/2015   Vitamin D deficiency 06/03/2015   Anemia, iron deficiency 06/03/2015   Microcytic anemia 05/07/2015   Essential hypertension, benign 05/06/2015   Ulnar neuropathy of right upper extremity 02/19/2014   Uterine fibroid with menorrhagia and anemia 12/25/2012   Lumbosacral spondylosis 12/19/2012   Back pain 02/17/2011   BMI 40.0-44.9, adult ([place]) 02/17/2011    PCP: Beatrice Lecher, MD  REFERRING PROVIDER: Silverio Decamp, MD  REFERRING DIAG:  M17.0 (ICD-10-CM) - Primary osteoarthritis of both knees    THERAPY DIAG:  Chronic pain of right knee  Chronic pain of left knee  Stiffness of right knee, not elsewhere classified  Muscle weakness (generalized)  Other abnormalities of gait and mobility  Rationale for Evaluation and Treatment: Rehabilitation  ONSET DATE: ~1 year  SUBJECTIVE:   SUBJECTIVE STATEMENT: Pt reports she has bilat knee arthritis. Pt states she got injection 1.5 weeks ago -- she did get some relief but over the weekend she feels she might have exacerbated some pain. Pt states she can only walk a couple of blocks.  Has difficulty walking up/down hills. Pt reports R knee is worse than L. Exercises given by Dr. Darene Lamer have helped the L knee but no reprieve for R knee. Notes history of back pain leading into R LE that may also be affecting her R side strength.   PERTINENT HISTORY: Chronic back pain PAIN:  Are you having pain? Yes: NPRS scale: at worst 8 or 9, currently 4 /10 Pain location: front/medial knee Pain description: Burning/throbbing Aggravating factors: Walking Relieving factors: Ice  PRECAUTIONS: None  WEIGHT BEARING RESTRICTIONS: No  FALLS:  Has patient fallen in last 6 months? No  LIVING ENVIRONMENT: Lives with: lives with their family and Brother and dad Lives in: House/apartment Stairs: Yes: External: 1 steps; none; Internal: 15 steps Rail on both side Has following equipment at home: None  OCCUPATION: Full time Psychologist, occupational -- does walk periodically but mainly sits  PLOF: Independent  PATIENT GOALS: Improve walking without pain   OBJECTIVE:   DIAGNOSTIC FINDINGS: See chart  PATIENT SURVEYS:  FOTO did not assess  COGNITION: Overall cognitive status: Within functional limits for tasks assessed     SENSATION: WFL  EDEMA:  None  MUSCLE LENGTH: Hamstrings: Right 50 deg (pulls in back); Left 70 deg  POSTURE: No Significant postural limitations  PALPATION: TTP R quad, hamstring, gastroc with multiple trigger points  LOWER EXTREMITY ROM:  Active ROM Right eval Left eval  Hip flexion    Hip extension    Hip abduction    Hip adduction    Hip internal rotation    Hip external rotation    Knee flexion 110 110  Knee extension -25 2  Ankle dorsiflexion    Ankle plantarflexion    Ankle inversion    Ankle eversion     (Blank rows = not tested)  LOWER EXTREMITY MMT:  MMT Right eval Left eval  Hip flexion 3+ 5  Hip extension 3+ 4+  Hip abduction 3+ 4+  Hip adduction 4+ 4+  Hip internal rotation    Hip external rotation    Knee flexion 4+ 5   Knee extension 4- 5  Ankle dorsiflexion    Ankle plantarflexion    Ankle inversion    Ankle eversion     (Blank rows = not tested)  LOWER EXTREMITY SPECIAL TESTS:  n/a  FUNCTIONAL TESTS:  5 times sit to stand: 16 sec L SLS: 30 sec; R SLS : 30 sec (mildly unsteady compared to L)  GAIT: Distance walked: 150' Assistive device utilized: None Level of assistance: Complete Independence Comments: WFL   TODAY'S TREATMENT:  DATE: 01/31/22 THEREX Hamstring stretch x 30 sec Standing quad stretch with strap x 30 sec Standing hip abd yellow TB x10 Standing hip ext yellow TB x10    PATIENT EDUCATION:  Education details: Exam findings, POC, HEP. Discussed Healthy Weight and Wellness Program (pt had mentioned interest in weight loss) Person educated: Patient Education method: Explanation, Demonstration, and Handouts Education comprehension: verbalized understanding, returned demonstration, verbal cues required, and needs further education  HOME EXERCISE PROGRAM: Access Code: [email or web address] URL: [email or web address] Date: 01/31/2022 Prepared by: Estill Bamberg April Thurnell Garbe  Exercises - Seated Hamstring Stretch  - 1 x daily - 7 x weekly - 2 sets - 30 sec hold - Standing Quad Stretch with Towel and Arm Support  - 1 x daily - 7 x weekly - 2 sets - 30 sec hold - Seated Quad Set  - 1 x daily - 7 x weekly - 2 sets - 10 reps - 5 sec hold - Standing Hip Abduction with Resistance at Ankles and Counter Support  - 1 x daily - 7 x weekly - 2 sets - 10 reps - Standing Hip Extension with Resistance at Ankles and Counter Support  - 1 x daily - 7 x weekly - 2 sets - 10 reps  ASSESSMENT:  CLINICAL IMPRESSION: Patient is a 51 y.o. F who was seen today for physical therapy evaluation and treatment for R>L Knee pain. Assessment significant for gross R>L LE weakness and  decreased R knee ROM. Limited due to chronic low back pain (R>L). Pt would benefit from PT to address these deficits for pain free community mobility at work and at home.   OBJECTIVE IMPAIRMENTS: decreased activity tolerance, decreased endurance, decreased mobility, difficulty walking, decreased ROM, decreased strength, hypomobility, increased edema, increased fascial restrictions, impaired flexibility, postural dysfunction, and pain.   ACTIVITY LIMITATIONS: bending, squatting, stairs, transfers, and locomotion level  PARTICIPATION LIMITATIONS: shopping, community activity, occupation, and yard work  PERSONAL FACTORS: Age, Past/current experiences, and Time since onset of injury/illness/exacerbation are also affecting patient's functional outcome.   REHAB POTENTIAL: Good  CLINICAL DECISION MAKING: Stable/uncomplicated  EVALUATION COMPLEXITY: Low   GOALS: Goals reviewed with patient? Yes  SHORT TERM GOALS: Target date: 02/28/2022   Pt will be ind with initial HEP Baseline: Goal status: INITIAL  2.  Pt will demo 5x STS </=13 sec for improved functional LE strength Baseline:  Goal status: INITIAL    LONG TERM GOALS: Target date: 03/28/2022   Pt will be ind with progression and advancement of HEP into a community wellness program Baseline:  Goal status: INITIAL  2.  Pt will demo R = L knee ROM Baseline:  Goal status: INITIAL  3.  Pt will be able to perform squat with at least 25# for improved carrying and lifting with </=2/10 knee pain Baseline:  Goal status: INITIAL  4.  Pt will be able to walk at least 1 mile with </=2/10 pain Baseline:  Goal status: INITIAL    PLAN:  PT FREQUENCY: 1x/week  PT DURATION: 8 weeks  PLANNED INTERVENTIONS: Therapeutic exercises, Therapeutic activity, Neuromuscular re-education, Balance training, Gait training, Patient/Family education, Self Care, Joint mobilization, Stair training, Aquatic Therapy, Dry Needling, Electrical  stimulation, Cryotherapy, Moist heat, Taping, Vasopneumatic device, Ultrasound, Ionotophoresis '4mg'$ /ml Dexamethasone, Manual therapy, and Re-evaluation  PLAN FOR NEXT SESSION: Review and progress HEP as indicated. 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 Gentle knee ROM and strengthening   Brandye Inthavong April Ma L [place], PT, DPT 01/31/2022, 2:05 PM

## 2022-02-09 ENCOUNTER — Encounter: Payer: Self-pay | Admitting: Physical Therapy

## 2022-02-09 ENCOUNTER — Ambulatory Visit: Payer: BC Managed Care – PPO | Attending: Sports Medicine | Admitting: Physical Therapy

## 2022-02-09 DIAGNOSIS — M25561 Pain in right knee: Secondary | ICD-10-CM | POA: Diagnosis not present

## 2022-02-09 DIAGNOSIS — R2689 Other abnormalities of gait and mobility: Secondary | ICD-10-CM | POA: Diagnosis present

## 2022-02-09 DIAGNOSIS — M25661 Stiffness of right knee, not elsewhere classified: Secondary | ICD-10-CM | POA: Diagnosis present

## 2022-02-09 DIAGNOSIS — M25562 Pain in left knee: Secondary | ICD-10-CM | POA: Diagnosis present

## 2022-02-09 DIAGNOSIS — M6281 Muscle weakness (generalized): Secondary | ICD-10-CM | POA: Diagnosis present

## 2022-02-09 DIAGNOSIS — G8929 Other chronic pain: Secondary | ICD-10-CM | POA: Insufficient documentation

## 2022-02-09 NOTE — Therapy (Addendum)
OUTPATIENT PHYSICAL THERAPY AND DISCHARGE   Patient Name: Elizabeth Copeland MRN: [id number] DOB:September 14, 1970, 51 y.o., female Today's Date: 02/09/2022   PT End of Session - 02/09/22 1437     Visit Number 2    Number of Visits 8    Date for PT Re-Evaluation 03/28/22    PT Start Time 1359    PT Stop Time 1438    PT Time Calculation (min) 39 min    Activity Tolerance Patient tolerated treatment well    Behavior During Therapy WFL for tasks assessed/performed              Past Medical History:  Diagnosis Date   Anemia    Back pain    Complication of anesthesia    patient has not received anesthesia before but states father and brother have a hard time waking up from anesthesia   Family history of adverse reaction to anesthesia    mother "has a hard time waking up and moody"    Fibroid    Headache    Hypertension    Morbid obesity ([place])    Pneumonia    Past Surgical History:  Procedure Laterality Date   BREAST BIOPSY Right    benign   CARPAL TUNNEL RELEASE Right    CESAREAN SECTION  2000   DILATION AND CURETTAGE OF UTERUS N/A 09/01/2015   Procedure: DILATATION AND CURETTAGE;  Surgeon: Emily Filbert, MD;  Location: [facility];  Service: Gynecology;  Laterality: N/A;   IR GENERIC HISTORICAL  11/18/2015   IR US GUIDE VASC ACCESS RIGHT 11/18/2015 [facility]   IR GENERIC HISTORICAL  11/18/2015   IR ANGIOGRAM SELECTIVE EACH ADDITIONAL VESSEL 11/18/2015 [facility]   IR GENERIC HISTORICAL  11/18/2015   IR EMBO TUMOR ORGAN ISCHEMIA INFARCT INC GUIDE ROADMAPPING 11/18/2015 [facility]   IR GENERIC HISTORICAL  11/18/2015   IR ANGIOGRAM PELVIS SELECTIVE OR SUPRASELECTIVE 11/18/2015 [facility]   IR GENERIC HISTORICAL  11/18/2015   IR ANGIOGRAM SELECTIVE EACH ADDITIONAL VESSEL 11/18/2015 [facility]   IR GENERIC HISTORICAL  11/18/2015   IR ANGIOGRAM PELVIS SELECTIVE OR SUPRASELECTIVE 11/18/2015 [facility]   IR GENERIC HISTORICAL  02/23/2016   IR RADIOLOGIST EVAL & MGMT  02/23/2016 Jacqulynn Cadet, MD [facility]   IR GENERIC HISTORICAL  12/02/2015   IR RADIOLOGIST EVAL & MGMT 12/02/2015 Greggory Keen, MD [facility]   IR RADIOLOGIST EVAL & MGMT  08/24/2016   WISDOM TOOTH EXTRACTION     Patient Active Problem List   Diagnosis Date Noted   Primary osteoarthritis of both knees 08/11/2021   Vertigo 06/09/2021   Carpal tunnel syndrome, bilateral 07/31/2020   Situational mixed anxiety and depressive disorder 06/24/2020   Renal cyst, left 12/25/2019   Synovitis of right hand 12/25/2019   Radiculitis of right cervical region 99/83/3825   Metabolic syndrome 05/39/7673   Fibromyalgia 12/02/2017   Plantar fasciitis of left foot 06/28/2017   Trochanteric bursitis of right hip 06/28/2017   Fatty liver 09/02/2016   Intramural leiomyoma of uterus 06/03/2015   Vitamin D deficiency 06/03/2015   Anemia, iron deficiency 06/03/2015   Microcytic anemia 05/07/2015   Essential hypertension, benign 05/06/2015   Ulnar neuropathy of right upper extremity 02/19/2014   Uterine fibroid with menorrhagia and anemia 12/25/2012   Lumbosacral spondylosis 12/19/2012   Back pain 02/17/2011   BMI 40.0-44.9, adult ([place]) 02/17/2011    PCP: Beatrice Lecher, MD  REFERRING PROVIDER: Silverio Decamp, MD  REFERRING DIAG:  M17.0 (ICD-10-CM) -  Primary osteoarthritis of both knees    THERAPY DIAG:  Chronic pain of right knee  Chronic pain of left knee  Muscle weakness (generalized)  Other abnormalities of gait and mobility  Stiffness of right knee, not elsewhere classified  Rationale for Evaluation and Treatment: Rehabilitation  ONSET DATE: ~1 year  SUBJECTIVE:   SUBJECTIVE STATEMENT: Pt states she feels the shot she got a few weeks ago has worn off and that she is having pain in Rt knee again. She had some questions about HEP which PT addressed   PERTINENT HISTORY: Chronic back pain PAIN:  Are you having pain? Yes: NPRS scale: at worst 8 or  9, currently 3 /10 Pain location: front/medial knee Pain description: Burning/throbbing Aggravating factors: Walking Relieving factors: Ice  PRECAUTIONS: None  WEIGHT BEARING RESTRICTIONS: No  FALLS:  Has patient fallen in last 6 months? No   PATIENT GOALS: Improve walking without pain   OBJECTIVE:    LOWER EXTREMITY ROM:  Active ROM Right eval Left eval Right 11/8  Hip flexion     Hip extension     Hip abduction     Hip adduction     Hip internal rotation     Hip external rotation     Knee flexion 110 110   Knee extension -25 2 -11  Ankle dorsiflexion     Ankle plantarflexion     Ankle inversion     Ankle eversion      (Blank rows = not tested)  LOWER EXTREMITY MMT:  MMT Right eval Left eval  Hip flexion 3+ 5  Hip extension 3+ 4+  Hip abduction 3+ 4+  Hip adduction 4+ 4+  Hip internal rotation    Hip external rotation    Knee flexion 4+ 5  Knee extension 4- 5  Ankle dorsiflexion    Ankle plantarflexion    Ankle inversion    Ankle eversion     (Blank rows = not tested)  LOWER EXTREMITY SPECIAL TESTS:  n/a  FUNCTIONAL TESTS: (Eval) 5 times sit to stand: 16 sec L SLS: 30 sec; R SLS : 30 sec (mildly unsteady compared to L)   TODAY'S TREATMENT:                                                                                                                              [facility] Adult PT Treatment:                                                DATE: 02/09/22 Therapeutic Exercise: Nustep L5 x 5 min warm up HS stretch standing with foot on 6'' step 2 x 30 sec Quad stretch kneeling 2 x 30 sec Hip abd 2 x 10 yellow TB Hip ext 2 x 10 yellow TB Straddle step up 8'' 2 x 10 Lateral  step up 8'' x 10 bilat Sit to stand 10#KB 2 x 10 Manual Therapy: STM and IASTM Rt hamstrings   10/30 THEREX Hamstring stretch x 30 sec Standing quad stretch with strap x 30 sec Standing hip abd yellow TB x10 Standing hip ext yellow TB x10    PATIENT EDUCATION:   Education details: Exam findings, POC, HEP. Discussed Healthy Weight and Wellness Program (pt had mentioned interest in weight loss) Person educated: Patient Education method: Explanation, Demonstration, and Handouts Education comprehension: verbalized understanding, returned demonstration, verbal cues required, and needs further education  HOME EXERCISE PROGRAM: Access Code: [email or web address] URL: [email or web address] Date: 02/09/2022 Prepared by: Isabelle Course  Exercises - Seated Quad Set  - 1 x daily - 7 x weekly - 2 sets - 10 reps - 5 sec hold - Standing Hip Abduction with Resistance at Ankles and Counter Support  - 1 x daily - 7 x weekly - 2 sets - 10 reps - Standing Hip Extension with Resistance at Ankles and Counter Support  - 1 x daily - 7 x weekly - 2 sets - 10 reps - Standing Hamstring Stretch with Step  - 1 x daily - 7 x weekly - 1 sets - 3 reps - 20-30 sec hold - Kneeling Plantarflexion Stretch  - 1 x daily - 7 x weekly - 1 sets - 3 reps - 20-30 sec hold - Lateral Step Ups  - 1 x daily - 7 x weekly - 2 sets - 10 reps  ASSESSMENT:  CLINICAL IMPRESSION: Pt with increased TTP to Rt hamstring. Modified and updated HEP  OBJECTIVE IMPAIRMENTS: decreased activity tolerance, decreased endurance, decreased mobility, difficulty walking, decreased ROM, decreased strength, hypomobility, increased edema, increased fascial restrictions, impaired flexibility, postural dysfunction, and pain.    GOALS: Goals reviewed with patient? Yes  SHORT TERM GOALS: Target date: 02/28/2022   Pt will be ind with initial HEP Baseline: Goal status: INITIAL  2.  Pt will demo 5x STS </=13 sec for improved functional LE strength Baseline:  Goal status: INITIAL    LONG TERM GOALS: Target date: 03/28/2022   Pt will be ind with progression and advancement of HEP into a community wellness program Baseline:  Goal status: INITIAL  2.  Pt will demo R = L knee ROM Baseline:  Goal status:  INITIAL  3.  Pt will be able to perform squat with at least 25# for improved carrying and lifting with </=2/10 knee pain Baseline:  Goal status: INITIAL  4.  Pt will be able to walk at least 1 mile with </=2/10 pain Baseline:  Goal status: INITIAL    PLAN:  PT FREQUENCY: 1x/week  PT DURATION: 8 weeks  PLANNED INTERVENTIONS: Therapeutic exercises, Therapeutic activity, Neuromuscular re-education, Balance training, Gait training, Patient/Family education, Self Care, Joint mobilization, Stair training, Aquatic Therapy, Dry Needling, Electrical stimulation, Cryotherapy, Moist heat, Taping, Vasopneumatic device, Ultrasound, Ionotophoresis 50m/ml Dexamethasone, Manual therapy, and Re-evaluation  PLAN FOR NEXT SESSION: Review and progress HEP as indicated. 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 Patient goals were not met. Patient is being discharged due to not returning since the last visit.  KIsabelle Course PT,DPT12/14/2310:05 AM   DIsabelle Course PT, DPT 02/09/2022, 2:38 PM

## 2022-02-11 ENCOUNTER — Telehealth: Payer: Self-pay | Admitting: Sports Medicine

## 2022-02-11 ENCOUNTER — Ambulatory Visit: Payer: BC Managed Care – PPO | Admitting: Sports Medicine

## 2022-02-11 ENCOUNTER — Ambulatory Visit (INDEPENDENT_AMBULATORY_CARE_PROVIDER_SITE_OTHER): Payer: BC Managed Care – PPO

## 2022-02-11 DIAGNOSIS — M170 Bilateral primary osteoarthritis of knee: Secondary | ICD-10-CM | POA: Diagnosis not present

## 2022-02-11 DIAGNOSIS — M797 Fibromyalgia: Secondary | ICD-10-CM | POA: Diagnosis not present

## 2022-02-11 DIAGNOSIS — M47817 Spondylosis without myelopathy or radiculopathy, lumbosacral region: Secondary | ICD-10-CM

## 2022-02-11 MED ORDER — TRIAMCINOLONE ACETONIDE 40 MG/ML IJ SUSP
80.0000 mg | Freq: Once | INTRAMUSCULAR | Status: AC
  Administered 2022-02-11: 80 mg via INTRAMUSCULAR

## 2022-02-11 NOTE — Addendum Note (Signed)
Addended by: Tarri Glenn A on: 02/11/2022 04:00 PM   Modules accepted: Orders

## 2022-02-11 NOTE — Assessment & Plan Note (Signed)
Bilateral knee arthritis, left knee injected back in June 2023, right knee was injected approximately 3 weeks ago, short-term relief with recurrence of pain. Proceeding with getting her approved for viscosupplementation. Still needs to lose approximately 75 pounds.

## 2022-02-11 NOTE — Assessment & Plan Note (Addendum)
Persistent axial low back pain, right-sided with radiation to the buttock, no areas of neuroforaminal stenosis on MRI from 2021. She did see Dr. Lynann Bologna who suggested there was no surgical pathology. She was referred to Dr. Mina Marble for consideration of SI joint RFA, this was not done. She has had epidurals that did not provide significant relief. Today we did a diagnostic and therapeutic injection right SI joint, we can repeat this if needed. She is already on a good medical regimen with high-dose gabapentin, Tylenol. Return to see me in 4 to 6 weeks, we can refer out for RFA of the SI joint if insufficient improvement. We could also certainly send her back to Dr. Lynann Bologna for discussion of SI joint fusion. Of note she had complete resolution of her right-sided axial low back pain and buttock pain after the injection today.

## 2022-02-11 NOTE — Telephone Encounter (Signed)
[facility] approval, right knee osteoarthritis, x-ray confirmed, failed analgesics and steroid injections.

## 2022-02-11 NOTE — Assessment & Plan Note (Signed)
Historically has been on Savella, Cymbalta, has discontinued these, she is on gabapentin without significant improvement. We will further discuss SNRI treatment at the follow-up visits.

## 2022-02-11 NOTE — Progress Notes (Signed)
    Procedures performed today:    Procedure: Real-time Ultrasound Guided injection of the right sacroiliac joint Device: Samsung HS60  Verbal informed consent obtained.  Time-out conducted.  Noted no overlying erythema, induration, or other signs of local infection.  Skin prepped in a sterile fashion.  Local anesthesia: Topical Ethyl chloride.  With sterile technique and under real time ultrasound guidance: Noted normal-appearing SI joint, 1 cc Kenalog 40, 2 cc lidocaine, 2 cc bupivacaine injected easily Completed without difficulty  Advised to call if fevers/chills, erythema, induration, drainage, or persistent bleeding.  Images permanently stored and available for review in PACS.  Impression: Technically successful ultrasound guided injection.  Independent interpretation of notes and tests performed by another provider:   None.  Brief History, Exam, Impression, and Recommendations:    Lumbosacral spondylosis Persistent axial low back pain, right-sided with radiation to the buttock, no areas of neuroforaminal stenosis on MRI from 2021. She did see Dr. Lynann Bologna who suggested there was no surgical pathology. She was referred to Dr. Mina Marble for consideration of SI joint RFA, this was not done. She has had epidurals that did not provide significant relief. Today we did a diagnostic and therapeutic injection right SI joint, we can repeat this if needed. She is already on a good medical regimen with high-dose gabapentin, Tylenol. Return to see me in 4 to 6 weeks, we can refer out for RFA of the SI joint if insufficient improvement. We could also certainly send her back to Dr. Lynann Bologna for discussion of SI joint fusion. Of note she had complete resolution of her right-sided axial low back pain and buttock pain after the injection today.  Primary osteoarthritis of both knees Bilateral knee arthritis, left knee injected back in June 2023, right knee was injected approximately 3 weeks ago,  short-term relief with recurrence of pain. Proceeding with getting her approved for viscosupplementation. Still needs to lose approximately 75 pounds.  Fibromyalgia Historically has been on Savella, Cymbalta, has discontinued these, she is on gabapentin without significant improvement. We will further discuss SNRI treatment at the follow-up visits.    ____________________________________________ Gwen Her. Dianah Field, M.D., ABFM., CAQSM., AME. Primary Care and Sports Medicine [facility]  Adjunct Professor of [employer] [employer]  Risk manager

## 2022-02-15 NOTE — Telephone Encounter (Signed)
[facility] paperwork faxed to [facility] at [phone] Request is for Orthovisc Pt's insurance prefers Orthovisc Fax confirmation receipt received  

## 2022-02-16 ENCOUNTER — Encounter: Payer: BC Managed Care – PPO | Admitting: Physical Therapy

## 2022-02-16 NOTE — Telephone Encounter (Signed)
Benefits Investigation Details received from [facility] Injection: Orthovisc  Medical: Deductible applies. Since the OOP has been met, pt is covered at 100%.  PA required: No  Pharmacy: Product not covered under pharmacy plan.   Specialty Pharmacy: [facility]  May fill through: [employer] Copay/Coinsurance:  Product Copay:  Administration Coinsurance:  Administration Copay:  Deductible: $500 (met: $500) Out of Pocket Max: $2000 (met: $2000)    Left msg for a return call from pt regarding injections.

## 2022-02-17 ENCOUNTER — Ambulatory Visit: Payer: BC Managed Care – PPO | Admitting: Physical Therapy

## 2022-02-18 ENCOUNTER — Other Ambulatory Visit: Payer: Self-pay | Admitting: Family Medicine

## 2022-02-18 DIAGNOSIS — Z1231 Encounter for screening mammogram for malignant neoplasm of breast: Secondary | ICD-10-CM

## 2022-02-23 ENCOUNTER — Encounter: Payer: BC Managed Care – PPO | Admitting: Rehabilitative and Restorative Service Providers"

## 2022-03-01 NOTE — Telephone Encounter (Signed)
Patient aware that she is covered at 100% for the injections. Unfortunately, I could not transfer her to the front desk for scheduling. Asked patient to call back to schedule gel injections.

## 2022-03-02 ENCOUNTER — Encounter: Payer: BC Managed Care – PPO | Admitting: Physical Therapy

## 2022-03-07 ENCOUNTER — Ambulatory Visit: Payer: BC Managed Care – PPO | Admitting: Family Medicine

## 2022-03-07 ENCOUNTER — Ambulatory Visit (INDEPENDENT_AMBULATORY_CARE_PROVIDER_SITE_OTHER): Payer: BC Managed Care – PPO

## 2022-03-07 ENCOUNTER — Encounter: Payer: Self-pay | Admitting: Family Medicine

## 2022-03-07 VITALS — BP 125/76 | HR 90 | Ht 63.0 in | Wt 252.1 lb

## 2022-03-07 DIAGNOSIS — R052 Subacute cough: Secondary | ICD-10-CM | POA: Diagnosis not present

## 2022-03-07 DIAGNOSIS — R0602 Shortness of breath: Secondary | ICD-10-CM | POA: Diagnosis not present

## 2022-03-07 DIAGNOSIS — F4323 Adjustment disorder with mixed anxiety and depressed mood: Secondary | ICD-10-CM | POA: Diagnosis not present

## 2022-03-07 DIAGNOSIS — R0982 Postnasal drip: Secondary | ICD-10-CM | POA: Diagnosis not present

## 2022-03-07 DIAGNOSIS — Z7689 Persons encountering health services in other specified circumstances: Secondary | ICD-10-CM

## 2022-03-07 DIAGNOSIS — R059 Cough, unspecified: Secondary | ICD-10-CM | POA: Diagnosis not present

## 2022-03-07 MED ORDER — IPRATROPIUM BROMIDE 0.06 % NA SOLN: 2.0000 | Freq: Three times a day (TID) | NASAL | 12 refills | Status: DC

## 2022-03-07 MED ORDER — SAXENDA 18 MG/3ML ~~LOC~~ SOPN
PEN_INJECTOR | SUBCUTANEOUS | 0 refills | Status: DC
  Filled 2022-03-16: qty 6, 27d supply, fill #0

## 2022-03-07 MED ORDER — DULOXETINE HCL 30 MG PO CPEP: 30.0000 mg | ORAL_CAPSULE | Freq: Every day | ORAL | 0 refills | Status: DC

## 2022-03-07 NOTE — Assessment & Plan Note (Signed)
Starting the Cymbalta.  And we can always go up on the dose especially if she feels like its not as effective as she would like it to be.  She was seeing a therapist previously but when her brother got sick she dropped that away she just had so much going on.  I still encouraged her to consider restarting again I think it could be helpful.

## 2022-03-07 NOTE — Assessment & Plan Note (Addendum)
Visit #: 1 Starting Weight: 252 lbs   Current weight: 252 lbs  Previous weight: Change in weight: Goal weight: Dietary goals: work on increasing veggies, eat at least twice a day  Exercise goals: consider stationary bike Medication: Start Saxenda 0.'6mg'$   Follow-up and referrals: 1 month

## 2022-03-07 NOTE — Progress Notes (Signed)
Established Patient Office Visit  Subjective   Patient ID: Elizabeth Copeland, female    DOB: 02-Jan-1971  Age: 51 y.o. MRN: [id number]  Chief Complaint  Patient presents with   Medication Refill    re    HPI Like to discuss weight management.  She said she was on something a few years ago that was an injection called Saxenda that help with weight management.  She says at that time it really worked well and was helpful she did get some nausea with it.  She just the point where after her break-up with her husband she has gained about 30 pounds and just really cannot seem to get the weight back off.  Though she is not currently exercising she says she really does not have a big appetite and some days only eats once a day.  Follow-up mixed anxiety/depression-she is currently taking Cymbalta.  Actually quit taking it several weeks ago because she did not think it was helping but now realizes that it was and would like to consider restarting the medication.  She also reports a dry cough for 2 months that seems to be worse in the afternoons and evenings.  She is also noticed she feels a little short of breath at night.  Denies any other upper respiratory symptoms.  She denies any heartburn or reflux symptoms but has had a lot of postnasal drip.    ROS    Objective:     BP 125/76   Pulse 90   Ht '5\' 3"'$  (1.6 m)   Wt 252 lb 1.3 oz (114.3 kg)   SpO2 95%   BMI 44.65 kg/m    Physical Exam Constitutional:      Appearance: She is well-developed.  HENT:     Head: Normocephalic and atraumatic.     Right Ear: External ear normal.     Left Ear: External ear normal.     Nose: Nose normal.  Eyes:     Conjunctiva/sclera: Conjunctivae normal.     Pupils: Pupils are equal, round, and reactive to light.  Neck:     Thyroid: No thyromegaly.  Cardiovascular:     Rate and Rhythm: Normal rate and regular rhythm.     Heart sounds: Normal heart sounds.  Pulmonary:     Effort: Pulmonary effort is  normal.     Breath sounds: Normal breath sounds. No wheezing.  Musculoskeletal:     Cervical back: Neck supple.  Lymphadenopathy:     Cervical: No cervical adenopathy.  Skin:    General: Skin is warm and dry.  Neurological:     Mental Status: She is alert and oriented to person, place, and time.      No results found for any visits on 03/07/22.    The 10-year ASCVD risk score (Arnett DK, et al., 2019) is: 3.4%    Assessment & Plan:   Problem List Items Addressed This Visit       Other   Situational mixed anxiety and depressive disorder - Primary    Starting the Cymbalta.  And we can always go up on the dose especially if she feels like its not as effective as she would like it to be.  She was seeing a therapist previously but when her brother got sick she dropped that away she just had so much going on.  I still encouraged her to consider restarting again I think it could be helpful.      Relevant Medications   DULoxetine (  CYMBALTA) 30 MG capsule   Encounter for weight management    Visit #: 1 Starting Weight: 252 lbs   Current weight: 252 lbs  Previous weight: Change in weight: Goal weight: Dietary goals: work on increasing veggies, eat at least twice a day  Exercise goals: consider stationary bike Medication: Start Saxenda 0.'6mg'$   Follow-up and referrals: 1 month       Relevant Medications   Liraglutide -Weight Management (SAXENDA) 18 MG/3ML SOPN   Other Visit Diagnoses     Subacute cough       Relevant Orders   DG Chest 2 View   Post-nasal drip       Relevant Medications   ipratropium (ATROVENT) 0.06 % nasal spray      Acute cough x2 months-chest is clear on exam today we will go ahead and move forward with chest x-ray since I do not have a great reason for her to have a persistent cough it does not sound like she is having any GERD symptoms she says in fact she has not has not had to use her Tums in quite a long time.  She is having some postnasal drip  so we will do an Atrovent nasal spray for a trial to see if that is helpful.  If cough persist then we will continue further work-up.  Return in about 5 weeks (around 04/11/2022) for weight management .    Beatrice Lecher, MD

## 2022-03-08 MED ORDER — PREDNISONE 20 MG PO TABS: 40.0000 mg | ORAL_TABLET | Freq: Every day | ORAL | 0 refills | Status: DC

## 2022-03-08 NOTE — Progress Notes (Signed)
HI Elizabeth Copeland, did see some thickening of the bronchial tubes.  I Elizabeth Copeland send over a course of prednisone for you to try to see if you feel like that is helpful as well as the postnasal drip medication.  If you are not improving then we should finally get you tested for asthma.

## 2022-03-09 ENCOUNTER — Ambulatory Visit: Payer: BC Managed Care – PPO | Admitting: Sports Medicine

## 2022-03-09 ENCOUNTER — Ambulatory Visit (INDEPENDENT_AMBULATORY_CARE_PROVIDER_SITE_OTHER): Payer: BC Managed Care – PPO

## 2022-03-09 DIAGNOSIS — M170 Bilateral primary osteoarthritis of knee: Secondary | ICD-10-CM | POA: Diagnosis not present

## 2022-03-09 DIAGNOSIS — M47817 Spondylosis without myelopathy or radiculopathy, lumbosacral region: Secondary | ICD-10-CM

## 2022-03-09 MED ORDER — HYALURONAN 30 MG/2ML IX SOSY
30.0000 mg | PREFILLED_SYRINGE | Freq: Once | INTRA_ARTICULAR | Status: AC
  Administered 2022-03-09: 30 mg via INTRA_ARTICULAR

## 2022-03-09 NOTE — Addendum Note (Signed)
Addended by: Tarri Glenn A on: 03/09/2022 11:39 AM   Modules accepted: Orders

## 2022-03-09 NOTE — Assessment & Plan Note (Signed)
Pleasant 51 year old female, she has chronic axial right-sided low back pain with radiation to the buttock, she had no areas of neuroforaminal stenosis on an MRI from 2021, she did see Dr. Lynann Bologna who suggested there was no surgical pathology. She was referred to Dr. Mina Marble with physiatry for consideration of SI joint RFA but this was not done. She has had epidurals in the past that have not provided significant relief. At the last visit I did a diagnostic and therapeutic SI joint injection on the right with ultrasound guidance and she had immediate relief of her pain. She is here today for her Orthovisc injections and reports good relief for a few days with recurrence of her right-sided axial low back pain. She is already taking high-dose gabapentin, acetaminophen. Considering good but short-term relief I would like her to get a second opinion from one of our neurosurgeons for discussion of SI joint fusion as well as additional discussion of radiofrequency ablation, she prefers not to go back to [place].

## 2022-03-09 NOTE — Progress Notes (Signed)
    Procedures performed today:    Procedure: Real-time Ultrasound Guided injection of the right knee Device: Samsung HS60  Verbal informed consent obtained.  Time-out conducted.  Noted no overlying erythema, induration, or other signs of local infection.  Skin prepped in a sterile fashion.  Local anesthesia: Topical Ethyl chloride.  With sterile technique and under real time ultrasound guidance: Mild effusion noted, 30 mg/2 mL of OrthoVisc (sodium hyaluronate) in a prefilled syringe was injected easily into the knee through a 22-gauge needle. Advised to call if fevers/chills, erythema, induration, drainage, or persistent bleeding.  Images permanently stored and available for review in PACS.  Impression: Technically successful ultrasound guided injection.  Independent interpretation of notes and tests performed by another provider:   None.  Brief History, Exam, Impression, and Recommendations:    Primary osteoarthritis of both knees Orthovisc No. 1 of 4 right knee, return in 1 week for #2 of 4.  Lumbosacral spondylosis Pleasant 51 year old female, she has chronic axial right-sided low back pain with radiation to the buttock, she had no areas of neuroforaminal stenosis on an MRI from 2021, she did see Dr. Lynann Bologna who suggested there was no surgical pathology. She was referred to Dr. Mina Marble with physiatry for consideration of SI joint RFA but this was not done. She has had epidurals in the past that have not provided significant relief. At the last visit I did a diagnostic and therapeutic SI joint injection on the right with ultrasound guidance and she had immediate relief of Copeland pain. She is here today for Copeland Orthovisc injections and reports good relief for a few days with recurrence of Copeland right-sided axial low back pain. She is already taking high-dose gabapentin, acetaminophen. Considering good but short-term relief I would like Copeland to get a second opinion from one of our  neurosurgeons for discussion of SI joint fusion as well as additional discussion of radiofrequency ablation, she prefers not to go back to [place].    ____________________________________________ Elizabeth Copeland. Elizabeth Copeland, M.D., ABFM., CAQSM., AME. Primary Care and Sports Medicine [facility]  Adjunct Professor of [employer] of [employer]  Risk manager

## 2022-03-09 NOTE — Assessment & Plan Note (Signed)
Orthovisc No. 1 of 4 right knee, return in 1 week for #2 of 4.

## 2022-03-16 ENCOUNTER — Ambulatory Visit (INDEPENDENT_AMBULATORY_CARE_PROVIDER_SITE_OTHER): Payer: BC Managed Care – PPO

## 2022-03-16 ENCOUNTER — Ambulatory Visit: Payer: BC Managed Care – PPO | Admitting: Sports Medicine

## 2022-03-16 ENCOUNTER — Other Ambulatory Visit (HOSPITAL_BASED_OUTPATIENT_CLINIC_OR_DEPARTMENT_OTHER): Payer: Self-pay

## 2022-03-16 ENCOUNTER — Encounter: Payer: Self-pay | Admitting: Family Medicine

## 2022-03-16 DIAGNOSIS — Z7689 Persons encountering health services in other specified circumstances: Secondary | ICD-10-CM

## 2022-03-16 DIAGNOSIS — M170 Bilateral primary osteoarthritis of knee: Secondary | ICD-10-CM | POA: Diagnosis not present

## 2022-03-16 MED ORDER — HYALURONAN 30 MG/2ML IX SOSY
30.0000 mg | PREFILLED_SYRINGE | Freq: Once | INTRA_ARTICULAR | Status: AC
  Administered 2022-03-16: 30 mg via INTRA_ARTICULAR

## 2022-03-16 NOTE — Assessment & Plan Note (Addendum)
Orthovisc 2 of 4 right knee, return in 1 week for #3 of 4. Exercise prescription given.

## 2022-03-16 NOTE — Progress Notes (Signed)
    Procedures performed today:    Procedure: Real-time Ultrasound Guided injection of the right knee Device: Samsung HS60  Verbal informed consent obtained.  Time-out conducted.  Noted no overlying erythema, induration, or other signs of local infection.  Skin prepped in a sterile fashion.  Local anesthesia: Topical Ethyl chloride.  With sterile technique and under real time ultrasound guidance: Mild effusion noted, 30 mg/2 mL of OrthoVisc (sodium hyaluronate) in a prefilled syringe was injected easily into the knee through a 22-gauge needle. Advised to call if fevers/chills, erythema, induration, drainage, or persistent bleeding.  Images permanently stored and available for review in PACS.  Impression: Technically successful ultrasound guided injection.  Independent interpretation of notes and tests performed by another provider:   None.  Brief History, Exam, Impression, and Recommendations:    Primary osteoarthritis of both knees Orthovisc 2 of 4 right knee, return in 1 week for #3 of 4. Exercise prescription given.    ____________________________________________ Gwen Her. Dianah Field, M.D., ABFM., CAQSM., AME. Primary Care and Sports Medicine [place] [facility]  Adjunct Professor of [place] [employer] [facility] of Medicine  Risk manager

## 2022-03-16 NOTE — Telephone Encounter (Signed)
L.O.V: 03/16/22

## 2022-03-17 ENCOUNTER — Other Ambulatory Visit: Payer: Self-pay

## 2022-03-17 ENCOUNTER — Other Ambulatory Visit (HOSPITAL_BASED_OUTPATIENT_CLINIC_OR_DEPARTMENT_OTHER): Payer: Self-pay

## 2022-03-17 MED ORDER — SAXENDA 18 MG/3ML ~~LOC~~ SOPN
PEN_INJECTOR | SUBCUTANEOUS | 0 refills | Status: AC
  Filled 2022-03-17: qty 6, 27d supply, fill #0

## 2022-03-21 ENCOUNTER — Other Ambulatory Visit (HOSPITAL_BASED_OUTPATIENT_CLINIC_OR_DEPARTMENT_OTHER): Payer: Self-pay

## 2022-03-22 ENCOUNTER — Other Ambulatory Visit (HOSPITAL_BASED_OUTPATIENT_CLINIC_OR_DEPARTMENT_OTHER): Payer: Self-pay

## 2022-03-23 ENCOUNTER — Ambulatory Visit (INDEPENDENT_AMBULATORY_CARE_PROVIDER_SITE_OTHER): Payer: BC Managed Care – PPO | Admitting: Sports Medicine

## 2022-03-23 ENCOUNTER — Other Ambulatory Visit (HOSPITAL_BASED_OUTPATIENT_CLINIC_OR_DEPARTMENT_OTHER): Payer: Self-pay

## 2022-03-23 ENCOUNTER — Ambulatory Visit (INDEPENDENT_AMBULATORY_CARE_PROVIDER_SITE_OTHER): Payer: BC Managed Care – PPO

## 2022-03-23 DIAGNOSIS — M170 Bilateral primary osteoarthritis of knee: Secondary | ICD-10-CM

## 2022-03-23 MED ORDER — HYALURONAN 30 MG/2ML IX SOSY
30.0000 mg | PREFILLED_SYRINGE | Freq: Once | INTRA_ARTICULAR | Status: AC
  Administered 2022-03-23: 30 mg via INTRA_ARTICULAR

## 2022-03-23 NOTE — Assessment & Plan Note (Signed)
Orthovisc 3 of 4 right knee, return in 1 week for #4 of 4.

## 2022-03-23 NOTE — Progress Notes (Signed)
    Procedures performed today:    Procedure: Real-time Ultrasound Guided injection of the right knee Device: Samsung HS60  Verbal informed consent obtained.  Time-out conducted.  Noted no overlying erythema, induration, or other signs of local infection.  Skin prepped in a sterile fashion.  Local anesthesia: Topical Ethyl chloride.  With sterile technique and under real time ultrasound guidance: Mild effusion noted, 30 mg/2 mL of OrthoVisc (sodium hyaluronate) in a prefilled syringe was injected easily into the knee through a 22-gauge needle. Advised to call if fevers/chills, erythema, induration, drainage, or persistent bleeding.  Images permanently stored and available for review in PACS.  Impression: Technically successful ultrasound guided injection.  Independent interpretation of notes and tests performed by another provider:   None.  Brief History, Exam, Impression, and Recommendations:    Primary osteoarthritis of both knees Orthovisc 3 of 4 right knee, return in 1 week for #4 of 4.     ____________________________________________ Gwen Her. Dianah Field, M.D., ABFM., CAQSM., AME. Primary Care and Sports Medicine [place] [facility]  Adjunct Professor of [place] [employer] [facility] of Medicine  Risk manager

## 2022-03-24 ENCOUNTER — Encounter: Payer: Self-pay | Admitting: Family Medicine

## 2022-03-25 ENCOUNTER — Encounter: Payer: Self-pay | Admitting: Family Medicine

## 2022-03-25 ENCOUNTER — Ambulatory Visit: Payer: BC Managed Care – PPO | Admitting: Family Medicine

## 2022-03-25 ENCOUNTER — Ambulatory Visit (INDEPENDENT_AMBULATORY_CARE_PROVIDER_SITE_OTHER): Payer: BC Managed Care – PPO

## 2022-03-25 VITALS — BP 126/86 | HR 88

## 2022-03-25 DIAGNOSIS — R221 Localized swelling, mass and lump, neck: Secondary | ICD-10-CM | POA: Diagnosis not present

## 2022-03-25 DIAGNOSIS — J029 Acute pharyngitis, unspecified: Secondary | ICD-10-CM

## 2022-03-25 DIAGNOSIS — E042 Nontoxic multinodular goiter: Secondary | ICD-10-CM | POA: Diagnosis not present

## 2022-03-25 LAB — CBC WITH DIFFERENTIAL/PLATELET
Absolute Monocytes: 704 cells/uL (ref 200–950)
Basophils Absolute: 55 cells/uL (ref 0–200)
Basophils Relative: 0.5 %
Eosinophils Absolute: 99 cells/uL (ref 15–500)
Eosinophils Relative: 0.9 %
HCT: 42.3 % (ref 35.0–45.0)
Hemoglobin: 14.4 g/dL (ref 11.7–15.5)
Lymphs Abs: 2156 cells/uL (ref 850–3900)
MCH: 29.3 pg (ref 27.0–33.0)
MCHC: 34 g/dL (ref 32.0–36.0)
MCV: 86 fL (ref 80.0–100.0)
MPV: 9 fL (ref 7.5–12.5)
Monocytes Relative: 6.4 %
Neutro Abs: 7986 cells/uL — ABNORMAL HIGH (ref 1500–7800)
Neutrophils Relative %: 72.6 %
Platelets: 313 10*3/uL (ref 140–400)
RBC: 4.92 10*6/uL (ref 3.80–5.10)
RDW: 13.2 % (ref 11.0–15.0)
Total Lymphocyte: 19.6 %
WBC: 11 10*3/uL — ABNORMAL HIGH (ref 3.8–10.8)

## 2022-03-25 NOTE — Progress Notes (Signed)
   Acute Office Visit  Subjective:     Patient ID: Elizabeth Copeland, female    DOB: 11-Jul-1970, 51 y.o.   MRN: [id number]  No chief complaint on file.   HPI Patient is in today for facial swelling under the chin and from ear to ear since Tuesday night.  She says she did start getting a sore throat the day before but that is not unusual for her.  She says she still has a mild sore throat.  Her right ear feels a little off but no pain.  No fever or chills.  She just feels like it swollen and it is uncomfortable.  And it goes down into her neck on both sides.  No trauma or injury.  No recent changes in lotions creams etc.  She has not eaten any unusual foods.  She has had a little bit of vertigo come back recently.  ROS      Objective:    BP 126/86   Pulse 88   SpO2 94%    Physical Exam Constitutional:      Appearance: She is well-developed.  HENT:     Head: Normocephalic and atraumatic.     Right Ear: Tympanic membrane, ear canal and external ear normal.     Left Ear: Tympanic membrane, ear canal and external ear normal.     Nose: Nose normal.     Mouth/Throat:     Pharynx: Oropharynx is clear. No oropharyngeal exudate or posterior oropharyngeal erythema.  Eyes:     Conjunctiva/sclera: Conjunctivae normal.     Pupils: Pupils are equal, round, and reactive to light.  Neck:     Thyroid: No thyromegaly.     Comments: Swelling mostly under the chin from ear to ear. Tenderness x 3 days. No discrete lymphadenopathy palpated. Nontender over the thyroid gland itself. Cardiovascular:     Rate and Rhythm: Normal rate and regular rhythm.     Heart sounds: Normal heart sounds.  Pulmonary:     Effort: Pulmonary effort is normal.     Breath sounds: Normal breath sounds. No wheezing.  Musculoskeletal:     Cervical back: Neck supple. Tenderness present. No rigidity.  Lymphadenopathy:     Cervical: No cervical adenopathy.  Skin:    General: Skin is warm and dry.  Neurological:      Mental Status: She is alert and oriented to person, place, and time.  Psychiatric:        Mood and Affect: Mood normal.     No results found for any visits on 03/25/22.      Assessment & Plan:   Problem List Items Addressed This Visit   None Visit Diagnoses     Neck swelling    -  Primary   Relevant Orders   CBC with Differential/Platelet   US Soft Tissue Head/Neck (NON-THYROID)   Sore throat          Neck swelling-unclear etiology at this point.  No recent changes or potential triggers.  Could be possible lymphadenopathy though I do not feel any discrete lymph nodes.  She is nontender over the thyroid gland directly.  Will check a CBC with differential and get an ultrasound scheduled.  No orders of the defined types were placed in this encounter.   No follow-ups on file.  Beatrice Lecher, MD

## 2022-03-25 NOTE — Progress Notes (Signed)
Blood Count looks reassuring is just slightly elevated but it looks like it is pretty close to your normal.  No anemia.

## 2022-03-29 ENCOUNTER — Other Ambulatory Visit (HOSPITAL_BASED_OUTPATIENT_CLINIC_OR_DEPARTMENT_OTHER): Payer: Self-pay

## 2022-03-29 MED ORDER — AMOXICILLIN-POT CLAVULANATE 875-125 MG PO TABS: 1.0000 | ORAL_TABLET | Freq: Two times a day (BID) | ORAL | 0 refills | Status: DC

## 2022-03-29 NOTE — Progress Notes (Signed)
HI Elizabeth Copeland,  Your ultrasound shows some swollen lymph nodes on both sides of the neck.  None of them look concerning such as cancer or lymphoma which is very reassuring.  It may be from inflammation from a virus or bacteria.  If you are still experiencing swelling and discomfort then please pick up the antibiotic to cover for bacteria.  I was hoping that if it was viral that you would actually be feeling better by now.  We can always repeat the ultrasound in a month just to make sure that things are improving.  They also noted a nodule on your thyroid gland which is separate.  It is measuring just a little bit bigger than a centimeter it is 1.1 cm.  Because it is greater than 1 cm they do recommend biopsy with a needle.  I think this is unrelated and more of an incidental finding but will need additional workup.  We would schedule Elizabeth Copeland for a thyroid specific ultrasound with a biopsy back to back.  That would have to be performed in [place] if she is okay with that then please let me know and we can get that ordered.

## 2022-03-30 ENCOUNTER — Other Ambulatory Visit (HOSPITAL_BASED_OUTPATIENT_CLINIC_OR_DEPARTMENT_OTHER): Payer: Self-pay

## 2022-03-31 ENCOUNTER — Other Ambulatory Visit (HOSPITAL_BASED_OUTPATIENT_CLINIC_OR_DEPARTMENT_OTHER): Payer: Self-pay

## 2022-04-01 ENCOUNTER — Telehealth: Payer: Self-pay

## 2022-04-01 ENCOUNTER — Ambulatory Visit: Payer: BC Managed Care – PPO | Admitting: Sports Medicine

## 2022-04-01 ENCOUNTER — Ambulatory Visit (INDEPENDENT_AMBULATORY_CARE_PROVIDER_SITE_OTHER): Payer: BC Managed Care – PPO

## 2022-04-01 DIAGNOSIS — M170 Bilateral primary osteoarthritis of knee: Secondary | ICD-10-CM

## 2022-04-01 MED ORDER — HYALURONAN 30 MG/2ML IX SOSY
30.0000 mg | PREFILLED_SYRINGE | Freq: Once | INTRA_ARTICULAR | Status: AC
  Administered 2022-04-01: 30 mg via INTRA_ARTICULAR

## 2022-04-01 NOTE — Assessment & Plan Note (Addendum)
Orthovisc 4 of 4 right knee, we can revisit this on MyChart in 6 weeks, will probably get an MRI if not better to evaluate how much of her pain is coming from a meniscal derangement. If there is a meniscal derangement we will refer for arthroscopy, if it is a simple osteoarthritis she will likely need an arthroplasty.

## 2022-04-01 NOTE — Telephone Encounter (Addendum)
Initiated Prior authorization [id number] 18MG /3ML pen-injectors Via: Covermymeds Case/Key:[id number] Status: denied as of 04/01/22 Reason:medical criteria not met/ awaiting fax Notified Pt via: Mychart

## 2022-04-01 NOTE — Progress Notes (Signed)
    Procedures performed today:    Procedure: Real-time Ultrasound Guided injection of the right knee Device: Samsung HS60  Verbal informed consent obtained.  Time-out conducted.  Noted no overlying erythema, induration, or other signs of local infection.  Skin prepped in a sterile fashion.  Local anesthesia: Topical Ethyl chloride.  With sterile technique and under real time ultrasound guidance: Mild effusion noted, 30 mg/2 mL of OrthoVisc (sodium hyaluronate) in a prefilled syringe was injected easily into the knee through a 22-gauge needle. Advised to call if fevers/chills, erythema, induration, drainage, or persistent bleeding.  Images permanently stored and available for review in PACS.  Impression: Technically successful ultrasound guided injection.  Independent interpretation of notes and tests performed by another provider:   None.  Brief History, Exam, Impression, and Recommendations:    Primary osteoarthritis of both knees Orthovisc 4 of 4 right knee, we can revisit this on MyChart in 6 weeks, will probably get an MRI if not better to evaluate how much of her pain is coming from a meniscal derangement. If there is a meniscal derangement we will refer for arthroscopy, if it is a simple osteoarthritis she will likely need an arthroplasty.    ____________________________________________ Gwen Her. Dianah Field, M.D., ABFM., CAQSM., AME. Primary Care and Sports Medicine [place] [facility]  Adjunct Professor of [place] of [facility] of Medicine  Risk manager

## 2022-04-05 ENCOUNTER — Other Ambulatory Visit (HOSPITAL_BASED_OUTPATIENT_CLINIC_OR_DEPARTMENT_OTHER): Payer: Self-pay

## 2022-04-06 ENCOUNTER — Telehealth: Payer: Self-pay | Admitting: *Deleted

## 2022-04-06 ENCOUNTER — Ambulatory Visit (INDEPENDENT_AMBULATORY_CARE_PROVIDER_SITE_OTHER): Payer: BC Managed Care – PPO | Admitting: Family Medicine

## 2022-04-06 ENCOUNTER — Encounter: Payer: Self-pay | Admitting: Family Medicine

## 2022-04-06 VITALS — BP 139/83 | HR 80 | Ht 63.0 in | Wt 249.1 lb

## 2022-04-06 DIAGNOSIS — E041 Nontoxic single thyroid nodule: Secondary | ICD-10-CM | POA: Diagnosis not present

## 2022-04-06 DIAGNOSIS — R591 Generalized enlarged lymph nodes: Secondary | ICD-10-CM

## 2022-04-06 NOTE — Telephone Encounter (Signed)
Cindy w/[facility] LVM stating that they received 2 orders for Elizabeth Copeland today Thyroid US and a fine needle aspiration Bx to be done at the same time. She stated that these cannot be done on the same day and that the radiologist prefers to have the results of the [place] first  before the Bx is done. She said that they can place a hold on the Bx appt but again cannot be done the same day.  If there are any questions we can call back and speak with either her, Prichard, or Vicky. [phone] choose opt #1 then #5.  Will fwd to pcp for advice on moving fwd with this.

## 2022-04-06 NOTE — Progress Notes (Signed)
   Established Patient Office Visit  Subjective   Patient ID: Elizabeth Copeland, female    DOB: 01-27-1971  Age: 52 y.o. MRN: [id number]  Chief Complaint  Patient presents with   Follow-up    Neck swelling, awaiting to hear about getting Bx done      HPI  She initially come in with neck swelling and discomfort.  She did have a slight cough as well but seems to be improved.  We did a round of prednisone after getting her ultrasound she does feel like the swelling is better than it was but still little bit puffy on her right side.  No significant pain or discomfort.  IMPRESSION: 1. Mildly prominent though non pathologically enlarged bilateral submandibular chain lymph nodes, nonspecific though presumably reactive in etiology. Clinical correlation to resolution is advised. 2. Incidentally noted thyroid nodules including an approximately 1.1 cm TR 5 nodule within the right lobe of the thyroid which meets imaging criteria to recommend sampling as indicated. (Note, would recommend a formal diagnostic thyroid ultrasound which can be performed immediately prior to recommended ultrasound-guided fine-needle aspiration.)    ROS    Objective:     BP 139/83 (BP Location: Left Arm, Patient Position: Sitting, Cuff Size: Large)   Pulse 80   Ht '5\' 3"'$  (1.6 m)   Wt 249 lb 1.9 oz (113 kg)   SpO2 97%   BMI 44.13 kg/m    Physical Exam HENT:     Head: Normocephalic.  Neck:     Comments: Is a little bit of fullness on the right anterior upper neck towards the jaw.  But no discrete lymphadenopathy. Musculoskeletal:     Cervical back: Neck supple. Tenderness present.  Lymphadenopathy:     Cervical: No cervical adenopathy.  Neurological:     Mental Status: She is alert.      No results found for any visits on 04/06/22.    The 10-year ASCVD risk score (Arnett DK, et al., 2019) is: 4.5%    Assessment & Plan:   Problem List Items Addressed This Visit   None Visit Diagnoses      Thyroid nodule    -  Primary   Relevant Orders   [place] FNA BX THYROID 1ST LESION AFIRMA   US THYROID   Lymphadenopathy           Thyroid nodule-we will get her scheduled for ultrasound for further evaluation and biopsy.  Lymphadenopathy - follow for 2 more weeks.  If not mostly resolved then can consider repeat US.  Cough is better.  Return if symptoms worsen or fail to improve.    Beatrice Lecher, MD

## 2022-04-08 NOTE — Telephone Encounter (Signed)
This is what the note said on the scan report:   (Note, would recommend a formal diagnostic thyroid ultrasound which can be performed immediately prior to recommended ultrasound-guided fine-needle aspiration.)

## 2022-04-13 ENCOUNTER — Ambulatory Visit
Admission: RE | Admit: 2022-04-13 | Discharge: 2022-04-13 | Disposition: A | Discharge status: Home / Self Care | Payer: BC Managed Care – PPO | Source: Ambulatory Visit | Attending: Family Medicine | Admitting: Family Medicine

## 2022-04-13 DIAGNOSIS — E041 Nontoxic single thyroid nodule: Secondary | ICD-10-CM

## 2022-04-13 DIAGNOSIS — Z1231 Encounter for screening mammogram for malignant neoplasm of breast: Secondary | ICD-10-CM

## 2022-04-13 NOTE — Progress Notes (Signed)
Hi Elizabeth Copeland,  You have a 1 cm nodule on the right side of your thyroid. They recommend repeat US in one year. Now worrisome feature.

## 2022-04-14 NOTE — Progress Notes (Signed)
Please call patient. Normal mammogram.  Repeat in 1 year.  

## 2022-04-19 NOTE — Telephone Encounter (Signed)
No recommendations for FNA pt to do thyroid US in 1 yr

## 2022-04-20 ENCOUNTER — Ambulatory Visit (INDEPENDENT_AMBULATORY_CARE_PROVIDER_SITE_OTHER): Payer: BC Managed Care – PPO

## 2022-04-20 ENCOUNTER — Encounter: Payer: Self-pay | Admitting: Family Medicine

## 2022-04-20 ENCOUNTER — Ambulatory Visit: Payer: BC Managed Care – PPO | Admitting: Family Medicine

## 2022-04-20 VITALS — BP 135/67 | HR 77 | Ht 63.0 in | Wt 254.0 lb

## 2022-04-20 DIAGNOSIS — Z7689 Persons encountering health services in other specified circumstances: Secondary | ICD-10-CM

## 2022-04-20 DIAGNOSIS — R591 Generalized enlarged lymph nodes: Secondary | ICD-10-CM

## 2022-04-20 DIAGNOSIS — R590 Localized enlarged lymph nodes: Secondary | ICD-10-CM

## 2022-04-20 DIAGNOSIS — J312 Chronic pharyngitis: Secondary | ICD-10-CM

## 2022-04-20 NOTE — Progress Notes (Signed)
   Established Patient Office Visit  Subjective   Patient ID: Elizabeth Copeland, female    DOB: 01/31/1971  Age: 52 y.o. MRN: [id number]  Chief Complaint  Patient presents with   Follow-up    Lymph node     HPI  She still feeling a tender lymph node on the right side of her neck below her ear.  She feels like it has not really resolved or gone away.  Still had a persistently sore throat.  She does have some intermittent reflux.  But not currently on medication for it she was having more postnasal drip but says its actually better than it was.  She is not currently using a nasal spray but still has 1 at home.  Weight management-unfortunately the Kirke Shaggy was denied she had used it previously years ago.  She is going to call her insurance today and see what they will cover and let me know.     ROS    Objective:     BP 135/67   Pulse 77   Ht '5\' 3"'$  (1.6 m)   Wt 254 lb (115.2 kg)   SpO2 96%   BMI 44.99 kg/m    Physical Exam Vitals and nursing note reviewed.  Constitutional:      Appearance: She is well-developed.  HENT:     Head: Normocephalic and atraumatic.  Neck:     Comments: There Is a small tender swollen lymph node just below the ear on the right side of the neck. Cardiovascular:     Rate and Rhythm: Normal rate and regular rhythm.     Heart sounds: Normal heart sounds.  Pulmonary:     Effort: Pulmonary effort is normal.     Breath sounds: Normal breath sounds.  Skin:    General: Skin is warm and dry.  Neurological:     Mental Status: She is alert and oriented to person, place, and time.  Psychiatric:        Behavior: Behavior normal.      No results found for any visits on 04/20/22.    The 10-year ASCVD risk score (Arnett DK, et al., 2019) is: 4.2%    Assessment & Plan:   Problem List Items Addressed This Visit       Other   Encounter for weight management    Visit #: 2 Starting Weight: 252 lbs    Current weight: 254 lbs  Previous  weight:252 lbs  Change in weight: Up 2 lbs  Goal weight: Dietary goals: work on increasing veggies, eat at least twice a day  Exercise goals: consider stationary bike Medication: Will check with insurance to see what is covered.  Kirke Shaggy is not covered on her current plan. Follow-up and referrals: 1 month      Other Visit Diagnoses     Lymphadenopathy    -  Primary   Relevant Orders   US Soft Tissue Head/Neck (NON-THYROID)   Chronic sore throat          Chronic sore throat-we did discuss maybe a trial of a PPI for 2 to 3 weeks to see if that is helpful.  Also consider restarting the nasal spray to see if that helps.  But if not improving then can refer to ENT for further workup.  Lymphadenopathy-will plan to repeat ultrasound since it has been about a month to check that area and just make sure that it looks okay.  No follow-ups on file.    Beatrice Lecher, MD

## 2022-04-20 NOTE — Progress Notes (Signed)
Pt stated that she would like to discuss the issues with her lymph nodes. She is not taking any weight loss medication at this time due to not getting this approved with her insurance company

## 2022-04-20 NOTE — Assessment & Plan Note (Addendum)
Visit #: 2 Starting Weight: 252 lbs    Current weight: 254 lbs  Previous weight:252 lbs  Change in weight: Up 2 lbs  Goal weight: Dietary goals: work on increasing veggies, eat at least twice a day  Exercise goals: consider stationary bike Medication: Will check with insurance to see what is covered.  Elizabeth Copeland is not covered on her current plan. Follow-up and referrals: 1 month

## 2022-04-22 ENCOUNTER — Other Ambulatory Visit: Payer: Self-pay | Admitting: Family Medicine

## 2022-04-22 DIAGNOSIS — R591 Generalized enlarged lymph nodes: Secondary | ICD-10-CM

## 2022-04-22 MED ORDER — WEGOVY 0.5 MG/0.5ML ~~LOC~~ SOAJ: 0.5000 mg | SUBCUTANEOUS | 0 refills | Status: DC

## 2022-04-22 MED ORDER — WEGOVY 0.25 MG/0.5ML ~~LOC~~ SOAJ: 0.2500 mg | SUBCUTANEOUS | 0 refills | Status: DC

## 2022-04-22 NOTE — Progress Notes (Signed)
Order placed for ENT ref

## 2022-04-22 NOTE — Progress Notes (Signed)
Tawney, they still saw several lymph nodes that are just a little bit swollen.  According to the note they have not changed significantly since the prior ultrasound.  In some ways it is reassuring that they have not gotten larger but they also have not really gone down much either.  I would like to consider referral to an ear nose and throat specialist to see what they think could be causing this.  If you are okay with that then please let me know.

## 2022-04-22 NOTE — Telephone Encounter (Signed)
Rx sent. Key see her note below about PA

## 2022-04-22 NOTE — Progress Notes (Signed)
Orders Placed This Encounter  Procedures   Ambulatory referral to ENT    Referral Priority:   Routine    Referral Type:   Consultation    Referral Reason:   Specialty Services Required    Requested Specialty:   Otolaryngology    Number of Visits Requested:   1

## 2022-04-27 ENCOUNTER — Other Ambulatory Visit (HOSPITAL_BASED_OUTPATIENT_CLINIC_OR_DEPARTMENT_OTHER): Payer: Self-pay

## 2022-04-27 ENCOUNTER — Encounter: Payer: Self-pay | Admitting: Family Medicine

## 2022-04-27 ENCOUNTER — Telehealth: Payer: Self-pay

## 2022-04-27 DIAGNOSIS — M5416 Radiculopathy, lumbar region: Secondary | ICD-10-CM | POA: Diagnosis not present

## 2022-04-27 DIAGNOSIS — Z6841 Body Mass Index (BMI) 40.0 and over, adult: Secondary | ICD-10-CM | POA: Diagnosis not present

## 2022-04-27 DIAGNOSIS — M47816 Spondylosis without myelopathy or radiculopathy, lumbar region: Secondary | ICD-10-CM | POA: Diagnosis not present

## 2022-04-27 NOTE — Telephone Encounter (Addendum)
Initiated Prior authorization [id number] 0.25MG/0.5ML auto-injectors Via: Covermymeds Case/Key:[id number] Status:denied  as of 04/27/22 Reason:Weightloss medication are a benefit exclusion Notified Pt via: Mychart

## 2022-05-10 ENCOUNTER — Other Ambulatory Visit: Payer: Self-pay | Admitting: Neurological Surgery

## 2022-05-10 DIAGNOSIS — M5416 Radiculopathy, lumbar region: Secondary | ICD-10-CM

## 2022-05-25 ENCOUNTER — Other Ambulatory Visit: Payer: Self-pay | Admitting: Family Medicine

## 2022-05-25 DIAGNOSIS — I10 Essential (primary) hypertension: Secondary | ICD-10-CM

## 2022-05-31 DIAGNOSIS — E041 Nontoxic single thyroid nodule: Secondary | ICD-10-CM | POA: Diagnosis not present

## 2022-05-31 DIAGNOSIS — J302 Other seasonal allergic rhinitis: Secondary | ICD-10-CM | POA: Diagnosis not present

## 2022-05-31 DIAGNOSIS — M797 Fibromyalgia: Secondary | ICD-10-CM | POA: Diagnosis not present

## 2022-05-31 DIAGNOSIS — R059 Cough, unspecified: Secondary | ICD-10-CM | POA: Diagnosis not present

## 2022-06-13 ENCOUNTER — Ambulatory Visit: Payer: BC Managed Care – PPO | Admitting: Sports Medicine

## 2022-06-13 ENCOUNTER — Ambulatory Visit (INDEPENDENT_AMBULATORY_CARE_PROVIDER_SITE_OTHER): Payer: BC Managed Care – PPO

## 2022-06-13 VITALS — BP 128/72 | HR 80 | Temp 98.5°F

## 2022-06-13 DIAGNOSIS — R059 Cough, unspecified: Secondary | ICD-10-CM | POA: Diagnosis not present

## 2022-06-13 DIAGNOSIS — B349 Viral infection, unspecified: Secondary | ICD-10-CM | POA: Insufficient documentation

## 2022-06-13 DIAGNOSIS — J9811 Atelectasis: Secondary | ICD-10-CM | POA: Diagnosis not present

## 2022-06-13 DIAGNOSIS — R058 Other specified cough: Secondary | ICD-10-CM

## 2022-06-13 LAB — POCT INFLUENZA A/B
Influenza A, POC: NEGATIVE
Influenza B, POC: NEGATIVE

## 2022-06-13 LAB — POCT RAPID STREP A (OFFICE): Rapid Strep A Screen: NEGATIVE

## 2022-06-13 MED ORDER — HYDROCOD POLI-CHLORPHE POLI ER 10-8 MG/5ML PO SUER: 5.0000 mL | Freq: Two times a day (BID) | ORAL | 0 refills | Status: DC | PRN

## 2022-06-13 NOTE — Assessment & Plan Note (Addendum)
2 and half days of malaise, cough, only minimal sore throat, she does feel somewhat hot but has not had any recorded fevers, no new muscle aches or bodyaches compared to baseline. Multiple sick contacts including some at work with influenza. On exam she appears well, speaking full sentences but coughing in the exam room, oropharynx, nasopharynx, ear canals unremarkable, lungs clear. Negative flu, negative strep test. This is simply a viral syndrome, I would like a chest x-ray considering her cough, adding Tussionex, other than that she can use over-the-counter cold and flu medications and return to see me as needed.

## 2022-06-13 NOTE — Progress Notes (Signed)
    Procedures performed today:    None.  Independent interpretation of notes and tests performed by another provider:   None.  Brief History, Exam, Impression, and Recommendations:    Viral syndrome 2 and half days of malaise, cough, only minimal sore throat, she does feel somewhat hot but has not had any recorded fevers, no new muscle aches or bodyaches compared to baseline. Multiple sick contacts including some at work with influenza. On exam she appears well, speaking full sentences but coughing in the exam room, oropharynx, nasopharynx, ear canals unremarkable, lungs clear. Negative flu, negative strep test. This is simply a viral syndrome, I would like a chest x-ray considering her cough, adding Tussionex, other than that she can use over-the-counter cold and flu medications and return to see me as needed.  I spent 30 minutes of total time managing this patient today, this includes chart review, face to face, and non-face to face time.  ____________________________________________ Gwen Her. Dianah Field, M.D., ABFM., CAQSM., AME. Primary Care and Sports Medicine [facility]  Adjunct Professor of [place] [employer] [facility] [employer] [facility] [employer]  Risk manager

## 2022-06-14 ENCOUNTER — Encounter: Payer: Self-pay | Admitting: Sports Medicine

## 2022-06-27 ENCOUNTER — Ambulatory Visit: Payer: BC Managed Care – PPO | Admitting: Sports Medicine

## 2022-06-27 ENCOUNTER — Encounter: Payer: Self-pay | Admitting: Sports Medicine

## 2022-06-27 ENCOUNTER — Ambulatory Visit (INDEPENDENT_AMBULATORY_CARE_PROVIDER_SITE_OTHER): Payer: BC Managed Care – PPO

## 2022-06-27 VITALS — BP 131/84 | HR 73

## 2022-06-27 DIAGNOSIS — E041 Nontoxic single thyroid nodule: Secondary | ICD-10-CM

## 2022-06-27 DIAGNOSIS — B349 Viral infection, unspecified: Secondary | ICD-10-CM

## 2022-06-27 DIAGNOSIS — J9811 Atelectasis: Secondary | ICD-10-CM | POA: Diagnosis not present

## 2022-06-27 DIAGNOSIS — R059 Cough, unspecified: Secondary | ICD-10-CM | POA: Diagnosis not present

## 2022-06-27 NOTE — Progress Notes (Signed)
    Procedures performed today:    None.  Independent interpretation of notes and tests performed by another provider:   None.  Brief History, Exam, Impression, and Recommendations:    Viral syndrome Recent viral syndrome, improving but still has some mild cough, chest discomfort with deep breathing. She had atelectasis on her prior x-ray, she has been using incentive spirometer, I would like a repeat chest x-ray. Also having some neck pain right-sided lower neck mostly near the thyroid, we will ultrasound this. If atelectasis still present we will CT her chest.  Thyroid nodule History of thyroid nodule, increasing neck pain, right-sided, no palpable masses. I would like to go ahead and knock out a thyroid ultrasound for her.    ____________________________________________ Gwen Her. Dianah Field, M.D., ABFM., CAQSM., AME. Primary Care and Sports Medicine [facility]  Adjunct Professor of [place] [employer]  Risk manager

## 2022-06-27 NOTE — Assessment & Plan Note (Signed)
History of thyroid nodule, increasing neck pain, right-sided, no palpable masses. I would like to go ahead and knock out a thyroid ultrasound for her.

## 2022-06-27 NOTE — Assessment & Plan Note (Signed)
Recent viral syndrome, improving but still has some mild cough, chest discomfort with deep breathing. She had atelectasis on her prior x-ray, she has been using incentive spirometer, I would like a repeat chest x-ray. Also having some neck pain right-sided lower neck mostly near the thyroid, we will ultrasound this. If atelectasis still present we will CT her chest.

## 2022-06-28 ENCOUNTER — Ambulatory Visit (INDEPENDENT_AMBULATORY_CARE_PROVIDER_SITE_OTHER): Payer: BC Managed Care – PPO

## 2022-06-28 DIAGNOSIS — E041 Nontoxic single thyroid nodule: Secondary | ICD-10-CM | POA: Diagnosis not present

## 2022-06-28 DIAGNOSIS — E042 Nontoxic multinodular goiter: Secondary | ICD-10-CM | POA: Diagnosis not present

## 2022-06-29 ENCOUNTER — Telehealth: Payer: Self-pay

## 2022-06-29 NOTE — Telephone Encounter (Signed)
Patient asked about chest X-ray results from 06/27/22. The results did not show posted in patient chart. Called radiology department and representative confirmed these had not been released but she will work on this  and we should have the results soon .

## 2022-06-30 ENCOUNTER — Encounter: Payer: Self-pay | Admitting: Family Medicine

## 2022-06-30 ENCOUNTER — Telehealth: Payer: BC Managed Care – PPO | Admitting: Family Medicine

## 2022-06-30 DIAGNOSIS — E041 Nontoxic single thyroid nodule: Secondary | ICD-10-CM | POA: Diagnosis not present

## 2022-06-30 DIAGNOSIS — R591 Generalized enlarged lymph nodes: Secondary | ICD-10-CM

## 2022-06-30 MED ORDER — LORAZEPAM 1 MG PO TABS: 1.0000 mg | ORAL_TABLET | Freq: Two times a day (BID) | ORAL | 0 refills | Status: DC | PRN

## 2022-06-30 NOTE — Assessment & Plan Note (Signed)
Per report recommendation [place] yearly to f/u right sided nodule.  Due again in 06/2023.

## 2022-06-30 NOTE — Progress Notes (Signed)
   Acute Office Visit  Subjective:     Patient ID: Elizabeth Copeland, female    DOB: 1970-04-29, 52 y.o.   MRN: [id number]  Chief Complaint  Patient presents with   Results    Thyroid US results    HPI Patient is in today for follow-up of thyroid ultrasound.  She was seen on March 11 initially for an upper respiratory illness.  And then followed up on March 25 her symptoms seem to be improving but she was starting to have an increase particularly in neck pain on the right side.  She had a known history of some mildly prominent lymph nodes.  They opted to go ahead and do a follow-up ultrasound.  Previously done a CT in January of this year showing stability of the enlarged lymph nodes on the right side they had not changed but we did opt for ENT referral.  ENT felt like they were most likely just benign and reactive and did not recommend any further treatment.   [place] results:  IMPRESSION: 1. Nodule 1 (TI-RADS 4), measuring 1.0 cm, located in the mid right thyroid lobe is unchanged since prior examination from 04/13/2022 and still meets criteria for imaging follow-up. Annual ultrasound surveillance is recommended until 5 years of stability is documented. 2. Unchanged mildly prominent bilateral neck lymph nodes. These are still of uncertain etiology. If there is evidence of growth on physical examination, contrast-enhanced soft tissue neck CT should be performed.   The above is in keeping with the ACR TI-RADS recommendations - J Am Coll Radiol 2017;14:587-595.    ROS      Objective:    There were no vitals taken for this visit.   Physical Exam Vitals reviewed.  Constitutional:      Appearance: She is well-developed.  HENT:     Head: Normocephalic and atraumatic.  Pulmonary:     Effort: Pulmonary effort is normal.  Skin:    Coloration: Skin is not pale.  Neurological:     Mental Status: She is alert and oriented to person, place, and time.  Psychiatric:         Behavior: Behavior normal.     No results found for any visits on 06/30/22.      Assessment & Plan:   Problem List Items Addressed This Visit       Endocrine   Thyroid nodule - Primary    Per report recommendation [place] yearly to f/u right sided nodule.  Due again in 06/2023.      Relevant Orders   CT Soft Tissue Neck W Contrast   Other Visit Diagnoses     Lymphadenopathy       Relevant Orders   CT Soft Tissue Neck W Contrast      Lymphadenopathy - reviewed results.  Will order CT with contrast for further workup.  Will send over ativan as she gets claustrophobic.    Meds ordered this encounter  Medications   LORazepam (ATIVAN) 1 MG tablet    Sig: Take 1 tablet (1 mg total) by mouth 2 (two) times daily as needed for anxiety.    Dispense:  2 tablet    Refill:  0    No follow-ups on file.  Beatrice Lecher, MD

## 2022-07-05 ENCOUNTER — Encounter: Payer: Self-pay | Admitting: Family Medicine

## 2022-07-08 ENCOUNTER — Ambulatory Visit (INDEPENDENT_AMBULATORY_CARE_PROVIDER_SITE_OTHER): Payer: BC Managed Care – PPO

## 2022-07-08 DIAGNOSIS — R591 Generalized enlarged lymph nodes: Secondary | ICD-10-CM

## 2022-07-08 DIAGNOSIS — E041 Nontoxic single thyroid nodule: Secondary | ICD-10-CM

## 2022-07-08 DIAGNOSIS — R590 Localized enlarged lymph nodes: Secondary | ICD-10-CM | POA: Diagnosis not present

## 2022-07-08 MED ORDER — IOHEXOL 300 MG/ML  SOLN
100.0000 mL | Freq: Once | INTRAMUSCULAR | Status: AC | PRN
  Administered 2022-07-08: 75 mL via INTRAVENOUS

## 2022-07-12 ENCOUNTER — Other Ambulatory Visit: Payer: Self-pay | Admitting: Family Medicine

## 2022-07-12 DIAGNOSIS — F4323 Adjustment disorder with mixed anxiety and depressed mood: Secondary | ICD-10-CM

## 2022-07-13 NOTE — Progress Notes (Signed)
HI [place],  [place] news, no suspicious looking lymph nodes.  There is an old calcified thyroid nodule but stable.  And they did see some mall benign appearing lymph nodes underneath the jaw.  1B just refers to the location.  But they did not see anything concerning or that looks worrisome for cancer which is fantastic news.  For now we will just continue to keep an eye on things but I do not think we need to do any additional workup at this point in time.

## 2022-07-18 ENCOUNTER — Encounter: Payer: Self-pay | Admitting: Family Medicine

## 2022-07-18 DIAGNOSIS — R053 Chronic cough: Secondary | ICD-10-CM

## 2022-07-19 NOTE — Telephone Encounter (Signed)
Orders Placed This Encounter  Procedures   Ambulatory referral to Allergy    Referral Priority:   Routine    Referral Type:   Allergy Testing    Referral Reason:   Specialty Services Required    Requested Specialty:   Allergy    Number of Visits Requested:   1    

## 2022-08-01 ENCOUNTER — Encounter: Payer: Self-pay | Admitting: Sports Medicine

## 2022-08-01 DIAGNOSIS — S99921A Unspecified injury of right foot, initial encounter: Secondary | ICD-10-CM

## 2022-08-01 DIAGNOSIS — M5416 Radiculopathy, lumbar region: Secondary | ICD-10-CM

## 2022-08-02 MED ORDER — GABAPENTIN 800 MG PO TABS: ORAL_TABLET | ORAL | 3 refills | Status: DC

## 2022-08-02 MED ORDER — TRAMADOL HCL 50 MG PO TABS: 50.0000 mg | ORAL_TABLET | Freq: Three times a day (TID) | ORAL | 0 refills | Status: DC | PRN

## 2022-08-08 ENCOUNTER — Other Ambulatory Visit: Payer: Self-pay | Admitting: Sports Medicine

## 2022-08-08 DIAGNOSIS — M5416 Radiculopathy, lumbar region: Secondary | ICD-10-CM

## 2022-08-17 ENCOUNTER — Ambulatory Visit: Payer: BC Managed Care – PPO | Admitting: Internal Medicine

## 2022-08-17 ENCOUNTER — Other Ambulatory Visit: Payer: Self-pay

## 2022-08-17 ENCOUNTER — Encounter: Payer: Self-pay | Admitting: Internal Medicine

## 2022-08-17 ENCOUNTER — Other Ambulatory Visit: Payer: Self-pay | Admitting: Internal Medicine

## 2022-08-17 VITALS — BP 136/80 | HR 84 | Temp 97.8°F | Resp 16 | Ht 62.75 in | Wt 255.2 lb

## 2022-08-17 DIAGNOSIS — K219 Gastro-esophageal reflux disease without esophagitis: Secondary | ICD-10-CM

## 2022-08-17 DIAGNOSIS — J343 Hypertrophy of nasal turbinates: Secondary | ICD-10-CM | POA: Diagnosis not present

## 2022-08-17 DIAGNOSIS — R053 Chronic cough: Secondary | ICD-10-CM

## 2022-08-17 DIAGNOSIS — J3089 Other allergic rhinitis: Secondary | ICD-10-CM

## 2022-08-17 MED ORDER — OMEPRAZOLE 20 MG PO CPDR: 20.0000 mg | DELAYED_RELEASE_CAPSULE | Freq: Every day | ORAL | 0 refills | Status: DC

## 2022-08-17 MED ORDER — AZELASTINE HCL 0.1 % NA SOLN: 1.0000 | Freq: Two times a day (BID) | NASAL | 5 refills | Status: DC | PRN

## 2022-08-17 MED ORDER — OLOPATADINE HCL 0.2 % OP SOLN: 1.0000 [drp] | Freq: Every day | OPHTHALMIC | 5 refills | Status: AC | PRN

## 2022-08-17 MED ORDER — FLUTICASONE PROPIONATE 50 MCG/ACT NA SUSP
2.0000 | Freq: Every day | NASAL | 5 refills | Status: DC
Start: 2022-08-17 — End: 2022-09-14

## 2022-08-17 MED ORDER — CETIRIZINE HCL 10 MG PO TABS
10.0000 mg | ORAL_TABLET | Freq: Every day | ORAL | 5 refills | Status: DC
Start: 2022-08-17 — End: 2022-09-14

## 2022-08-17 NOTE — Patient Instructions (Addendum)
Return in about 4 weeks (around 09/14/2022).   Maricela Bo Chronic Cough - Recommend stopping ACEI and trying ARB due to this persistent dry cough. - Will also work on post nasal drainage control as discussed below. - Discussed that GERD can also be contributing. - No history of asthma, spirometry today was without obstruction.    GERD - Start omeprazole 20mg  daily for 4 weeks.  Take this in the morning on an empty stomach and then eat a snack/meal about 30-45 minutes later.  -Avoid lying down for at least two hours after a meal or after drinking acidic beverages, like soda, or other caffeinated beverages. This can help to prevent stomach contents from flowing back into the esophagus. -Keep your head elevated while you sleep. Using an extra pillow or two can also help to prevent reflux. -Eat smaller and more frequent meals each day instead of a few large meals. This promotes digestion and can aid in preventing heartburn. -Wear loose-fitting clothes to ease pressure on the stomach, which can worsen heartburn and reflux. -Reduce excess weight around the midsection. This can ease pressure on the stomach. Such pressure can force some stomach contents back up the esophagus.   Allergic Rhinitis:  - Positive skin test 08/2022: mold  - Avoidance measures discussed. - Use nasal saline rinses before nose sprays such as with Neilmed Sinus Rinse.  Use distilled water.   - Use Flonase 2 sprays each nostril daily. Aim upward and outward. - Use Azelastine 1-2 sprays each nostril twice daily as needed for congestion, drainage, runny nose, sneezing. Aim upward and outward. - Use Zyrtec 10 mg daily.  - For eyes, use Olopatadine or Ketotifen 1 eye drop daily as needed for itchy, watery eyes.  Available over the counter, if not covered by insurance.  - Consider allergy shots as long term control of your symptoms by teaching your immune system to be more tolerant of your allergy triggers.   ALLERGEN  AVOIDANCE MEASURES  Molds - Indoor avoidance Use air conditioning to reduce indoor humidity.  Do not use a humidifier. Keep indoor humidity at 30 - 40%.  Use a dehumidifier if needed. In the bathroom use an exhaust fan or open a window after showering.  Wipe down damp surfaces after showering.  Clean bathrooms with a mold-killing solution (diluted bleach, or products like Tilex, etc) at least once a month. In the kitchen use an exhaust fan to remove steam from cooking.  Throw away spoiled foods immediately, and empty garbage daily.  Empty water pans below self-defrosting refrigerators frequently. Vent the clothes dryer to the outside. Limit indoor houseplants; mold grows in the dirt.  No houseplants in the bedroom. Remove carpet from the bedroom. Encase the mattress and box springs with a zippered encasing.  Molds - Outdoor avoidance Avoid being outside when the grass is being mowed, or the ground is tilled. Avoid playing in leaves, pine straw, hay, etc.  Dead plant materials contain mold. Avoid going into barns or grain storage areas. Remove leaves, clippings and compost from around the home.

## 2022-08-17 NOTE — Progress Notes (Signed)
NEW PATIENT  Date of Service/Encounter:  08/17/22  Consult requested by: Agapito Games, MD   Subjective:   Elizabeth Copeland (DOB: 04-Sep-1970) is a 52 y.o. female who presents to the clinic on 08/17/2022 with a chief complaint of Establish Care and Cough (Cough sneezing since 11/2021-06/2022) .    History obtained from: chart review and patient.   Chronic Cough: Started around August 2023.  She does recall her allergies flared up around that time because she was also having itchy red eyes.  Mostly a dry cough but intermittently has some mucous production.  She also was very sick in December, thought to be a viral illness. CXR have been unremarkable, maybe some atelectasis.  She also reports having a lot of trouble with post nasal drainage.  Also has heartburn during the day but not taking anything for it.  Cough is worse when she lays down to sleep. She is on lisinopril. No history of asthma. No wheezing or SOB.  Has tried Flonase/Ipratropium/Zyrtec/Benadryl.   Rhinitis:  Started early in life around elementary school.  Symptoms include: nasal congestion, rhinorrhea, post nasal drainage, sneezing, watery eyes, and itchy eyes .  Occurs seasonally-Spring/Fall  Potential triggers: cats and pollen Treatments tried:  Ipratroprium; stopped it due to nasal dryness and nosebleeds Flonase; not sure why she stopped it Zyrtec daily and benadryl PRN; last use was over a week ago  Previous allergy testing: no History of sinus surgery: no Nonallergic triggers: none   Past Medical History: Past Medical History:  Diagnosis Date   Anemia    Back pain    Complication of anesthesia    patient has not received anesthesia before but states father and brother have a hard time waking up from anesthesia   Family history of adverse reaction to anesthesia    mother "has a hard time waking up and moody"    Fibroid    Headache    Hypertension    Morbid obesity (HCC)    Pneumonia    Past  Surgical History: Past Surgical History:  Procedure Laterality Date   BREAST BIOPSY Right    benign   CARPAL TUNNEL RELEASE Right    CESAREAN SECTION  2000   DILATION AND CURETTAGE OF UTERUS N/A 09/01/2015   Procedure: DILATATION AND CURETTAGE;  Surgeon: Allie Bossier, MD;  Location: [facility];  Service: Gynecology;  Laterality: N/A;   IR GENERIC HISTORICAL  11/18/2015   IR US GUIDE VASC ACCESS RIGHT 11/18/2015 [facility]   IR GENERIC HISTORICAL  11/18/2015   IR ANGIOGRAM SELECTIVE EACH ADDITIONAL VESSEL 11/18/2015 [facility]   IR GENERIC HISTORICAL  11/18/2015   IR EMBO TUMOR ORGAN ISCHEMIA INFARCT INC GUIDE ROADMAPPING 11/18/2015 [facility]   IR GENERIC HISTORICAL  11/18/2015   IR ANGIOGRAM PELVIS SELECTIVE OR SUPRASELECTIVE 11/18/2015 [facility]   IR GENERIC HISTORICAL  11/18/2015   IR ANGIOGRAM SELECTIVE EACH ADDITIONAL VESSEL 11/18/2015 [facility]   IR GENERIC HISTORICAL  11/18/2015   IR ANGIOGRAM PELVIS SELECTIVE OR SUPRASELECTIVE 11/18/2015 [facility]   IR GENERIC HISTORICAL  02/23/2016   IR RADIOLOGIST EVAL & MGMT 02/23/2016 Malachy Moan, MD [facility]   IR GENERIC HISTORICAL  12/02/2015   IR RADIOLOGIST EVAL & MGMT 12/02/2015 Berdine Dance, MD [facility]   IR RADIOLOGIST EVAL & MGMT  08/24/2016   WISDOM TOOTH EXTRACTION      Family History: Family History  Problem Relation Age of Onset   Lung cancer Mother  Hypertension Mother    Diabetes Father    Melanoma Father    COPD Father    Diabetes Brother    Obesity Brother    Hypertension Other     Social History:  Lives in a 10 year house Flooring in bedroom: wood Pets: cat Tobacco use/exposure: smoked for 20 years but mostly social and highest was 0.5ppd; quit 20 years ago. Job: Microbiologist   Medication List:  Allergies as of 08/17/2022   No Known Allergies      Medication List        Accurate as of Aug 17, 2022 11:44 AM. If you have any questions, ask your nurse or  doctor.          STOP taking these medications    cetirizine 10 MG chewable tablet Commonly known as: ZYRTEC Stopped by: Birder Robson, MD       TAKE these medications    DULoxetine 30 MG capsule Commonly known as: CYMBALTA TAKE 1 CAPSULE(30 MG) BY MOUTH DAILY   gabapentin 800 MG tablet Commonly known as: NEURONTIN TAKE 1 TABLET BY MOUTH IN THE MORNING, 1 TABLET IN THE MIDDAY, 2 TABLETS IN THE EVENING   lisinopril-hydrochlorothiazide 20-12.5 MG tablet Commonly known as: ZESTORETIC TAKE 1 TABLET BY MOUTH DAILY   LORazepam 1 MG tablet Commonly known as: ATIVAN Take 1 tablet (1 mg total) by mouth 2 (two) times daily as needed for anxiety.   traMADol 50 MG tablet Commonly known as: ULTRAM Take 1 tablet (50 mg total) by mouth every 8 (eight) hours as needed for moderate pain.         REVIEW OF SYSTEMS: Pertinent positives and negatives discussed in HPI.   Objective:   Physical Exam: BP 136/80 (BP Location: Left Arm, Patient Position: Sitting, Cuff Size: Normal)   Pulse 84   Temp 97.8 F (36.6 C) (Temporal)   Resp 16   Ht 5' 2.75" (1.594 m)   Wt 255 lb 3.2 oz (115.8 kg)   SpO2 97%   BMI 45.57 kg/m  Body mass index is 45.57 kg/m. GEN: alert, well developed HEENT: clear conjunctiva, TM grey and translucent, nose with + inferior turbinate hypertrophy, pink nasal mucosa, slight clear rhinorrhea, + cobblestoning HEART: regular rate and rhythm, no murmur LUNGS: clear to auscultation bilaterally, no coughing, unlabored respiration ABDOMEN: soft, non distended  SKIN: no rashes or lesions  Reviewed:  06/27/2022: saw Dr. Benjamin Stain for cough and chest discomfort.  Prior CXR with atelectasis.  Also undergoing workup for a thyroid nodule with [place].  Thought to be post viral syndrome.  04/20/2022: saw Dr. Linford Arnold for sore throat, reflux, post nasal drip.  Discussed PPI trial and restarting a nose spray.   Spirometry:  Tracings reviewed. Her effort: Good  reproducible efforts. FVC: 2.19L FEV1: 1.91L, 76% predicted FEV1/FVC ratio: 87% Interpretation: Spirometry consistent with possible restrictive disease.  Please see scanned spirometry results for details.  Skin Testing:  Skin prick testing was placed, which includes aeroallergens/foods, histamine control, and saline control.  Verbal consent was obtained prior to placing test.  Patient tolerated procedure well.  Allergy testing results were read and interpreted by myself, documented by clinical staff. Adequate positive and negative control.  Results discussed with patient/family.  Airborne Adult Perc - 08/17/22 1010     Time Antigen Placed 0900    Allergen Manufacturer Waynette Buttery    Location Back    Number of Test 59    Panel 1 Select    1. Control-Buffer  50% Glycerol Negative    2. Control-Histamine 1 mg/ml 3+    3. Albumin saline Negative    4. Bahia Negative    5. French Southern Territories Negative    6. Johnson Negative    7. Kentucky Blue Negative    8. Meadow Fescue Negative    9. Perennial Rye Negative    10. Sweet Vernal Negative    11. Timothy Negative    12. Cocklebur Negative    13. Burweed Marshelder Negative    14. Ragweed, short Negative    15. Ragweed, Giant Negative    16. Plantain,  English Negative    17. Lamb's Quarters Negative    18. Sheep Sorrell Negative    19. Rough Pigweed Negative    20. Marsh Elder, Rough Negative    21. Mugwort, Common Negative    22. Ash mix Negative    23. Birch mix Negative    24. Beech American Negative    25. Box, Elder Negative    26. Cedar, red Negative    27. Cottonwood, Guinea-Bissau Negative    28. Elm mix Negative    29. Hickory Negative    30. Maple mix Negative    31. Oak, Guinea-[place]issau mix Negative    32. Pecan Pollen Negative    33. Pine mix Negative    34. Sycamore Eastern Negative    35. Walnut, Black Pollen Negative    36. Alternaria alternata Negative    37. Cladosporium Herbarum Negative    38. Aspergillus mix Negative    39.  Penicillium mix Negative    40. Bipolaris sorokiniana (Helminthosporium) Negative    41. Drechslera spicifera (Curvularia) Negative    42. Mucor plumbeus Negative    43. Fusarium moniliforme Negative    44. Aureobasidium pullulans (pullulara) Negative    45. Rhizopus oryzae Negative    46. Botrytis cinera Negative    47. Epicoccum nigrum Negative    48. Phoma betae Negative    49. Candida Albicans Negative    50. Trichophyton mentagrophytes Negative    51. Mite, D Farinae  5,000 AU/ml Negative    52. Mite, D Pteronyssinus  5,000 AU/ml Negative    53. Cat Hair 10,000 BAU/ml Negative    54.  Dog Epithelia Negative    55. Mixed Feathers Negative    56. Horse Epithelia Negative    57. Cockroach, German Negative    58. Mouse Negative    59. Tobacco Leaf Negative             Intradermal - 08/17/22 1012     Time Antigen Placed 1000    Location Arm    Number of Test 15    Intradermal Select    Control Negative    French Southern Territories Negative    Johnson Negative    7 Grass Negative    Ragweed mix Negative    Weed mix Negative    Tree mix Negative    Mold 1 Negative    Mold 2 2+    Mold 3 2+    Mold 4 2+    Cat Negative    Dog Negative    Cockroach Negative    Mite mix Negative               Assessment:   1. Chronic cough   2. Gastroesophageal reflux disease, unspecified whether esophagitis present   3. Nasal turbinate hypertrophy   4. Allergic rhinitis caused by mold     Plan/Recommendations:  Chronic Cough -  Recommend stopping ACEI and trying ARB due to this persistent dry cough. - Will also work on post nasal drainage control as discussed below. - Discussed that GERD can also be contributing. See below. - No history of asthma, spirometry today was without obstruction.    GERD - Start omeprazole 20mg  daily for 4 weeks.  Take this in the morning on an empty stomach and then eat a snack/meal about 30-45 minutes later.  -Avoid lying down for at least two hours after  a meal or after drinking acidic beverages, like soda, or other caffeinated beverages. This can help to prevent stomach contents from flowing back into the esophagus. -Keep your head elevated while you sleep. Using an extra pillow or two can also help to prevent reflux. -Eat smaller and more frequent meals each day instead of a few large meals. This promotes digestion and can aid in preventing heartburn. -Wear loose-fitting clothes to ease pressure on the stomach, which can worsen heartburn and reflux. -Reduce excess weight around the midsection. This can ease pressure on the stomach. Such pressure can force some stomach contents back up the esophagus.   Allergic Rhinitis: - Due to turbinate hypertrophy, seasonal flare ups and unresponsive to OTC meds, performed skin testing to identify aeroallergen triggers.   - Positive skin test 08/2022: mold  - Avoidance measures discussed. - Use nasal saline rinses before nose sprays such as with Neilmed Sinus Rinse.  Use distilled water.   - Use Flonase 2 sprays each nostril daily. Aim upward and outward. - Use Azelastine 1-2 sprays each nostril twice daily as needed for congestion, drainage, runny nose, sneezing. Aim upward and outward. - Use Zyrtec 10 mg daily.  - For eyes, use Olopatadine or Ketotifen 1 eye drop daily as needed for itchy, watery eyes.  Available over the counter, if not covered by insurance.  - Consider allergy shots as long term control of your symptoms by teaching your immune system to be more tolerant of your allergy triggers     Return in about 4 weeks (around 09/14/2022).  Alesia Morin, MD [facility]

## 2022-08-18 ENCOUNTER — Encounter: Payer: Self-pay | Admitting: Internal Medicine

## 2022-08-19 ENCOUNTER — Encounter: Payer: Self-pay | Admitting: Internal Medicine

## 2022-09-14 ENCOUNTER — Other Ambulatory Visit: Payer: Self-pay

## 2022-09-14 ENCOUNTER — Encounter: Payer: Self-pay | Admitting: Internal Medicine

## 2022-09-14 ENCOUNTER — Ambulatory Visit: Payer: BC Managed Care – PPO | Admitting: Internal Medicine

## 2022-09-14 VITALS — BP 124/72 | HR 97 | Temp 97.7°F | Resp 20 | Wt 258.0 lb

## 2022-09-14 DIAGNOSIS — J3089 Other allergic rhinitis: Secondary | ICD-10-CM | POA: Diagnosis not present

## 2022-09-14 DIAGNOSIS — R053 Chronic cough: Secondary | ICD-10-CM

## 2022-09-14 DIAGNOSIS — K219 Gastro-esophageal reflux disease without esophagitis: Secondary | ICD-10-CM

## 2022-09-14 MED ORDER — FLUTICASONE PROPIONATE 50 MCG/ACT NA SUSP: 2.0000 | Freq: Every day | NASAL | 5 refills | Status: AC

## 2022-09-14 MED ORDER — CETIRIZINE HCL 10 MG PO TABS: 10.0000 mg | ORAL_TABLET | Freq: Every day | ORAL | 5 refills | Status: AC

## 2022-09-14 MED ORDER — AZELASTINE HCL 0.1 % NA SOLN: 1.0000 | Freq: Two times a day (BID) | NASAL | 5 refills | Status: AC | PRN

## 2022-09-14 MED ORDER — OMEPRAZOLE 20 MG PO CPDR: 20.0000 mg | DELAYED_RELEASE_CAPSULE | Freq: Every day | ORAL | 1 refills | Status: AC

## 2022-09-14 NOTE — Progress Notes (Signed)
FOLLOW UP Date of Service/Encounter:  09/14/22   Subjective:  Elizabeth SCHLEGELMILCH (DOB: 19-Feb-1971) is a 52 y.o. female [facility] for follow up for chronic cough, GERD and allergic rhinitis.   History obtained from: chart review and patient. Last visit was on 08/17/2022 for chronic cough; thought to be multifactorial with post nasal drainage, possibly reflux and ACEI.  Spirometry normal, discussed low suspicion for asthma.   Cough: Reports this is much better.  No longer having any issues with this. She is taking omeprazole 20mg  daily; denies any heartburn, reflux, sour taste in mouth. She did not stop the lisinopril.   Allergies: Has had on and off flare ups with congestion and drainage.  Also had a sore throat about 2 days ago but now resolved.  Doing Flonase and Zyrtec daily, has not tried Azelastine.  Does nasal rinses about every other day.   Past Medical History: Past Medical History:  Diagnosis Date   Anemia    Back pain    Complication of anesthesia    patient has not received anesthesia before but states father and brother have a hard time waking up from anesthesia   Family history of adverse reaction to anesthesia    mother "has a hard time waking up and moody"    Fibroid    Headache    Hypertension    Morbid obesity (HCC)    Pneumonia     Objective:  BP 124/72 (BP Location: Left Arm, Patient Position: Sitting, Cuff Size: Normal)   Pulse 97   Temp 97.7 F (36.5 C) (Temporal)   Resp 20   Wt 258 lb (117 kg)   SpO2 97%   BMI 46.07 kg/m  Body mass index is 46.07 kg/m. Physical Exam: GEN: alert, well developed HEENT: clear conjunctiva, TM grey and translucent, nose with mild inferior turbinate hypertrophy, [ink nasal mucosa, no rhinorrhea, slight cobblestoning HEART: regular rate and rhythm, no murmur LUNGS: clear to auscultation bilaterally, no coughing, unlabored respiration SKIN: no rashes or  lesions   Assessment:   1. Chronic cough   2. Gastroesophageal reflux disease, unspecified whether esophagitis present   3. Allergic rhinitis caused by mold     Plan/Recommendations:  Chronic Cough - Improved.  - If the cough returns, switch to ARB from ACEI. - Will also work on post nasal drainage control as discussed below. - Discussed that GERD can also be contributing. - No history of asthma, spirometry previously was without obstruction.  Low suspicion for asthma.   GERD - Continue omeprazole 20mg  daily for now.  If controlled at next visit, will discuss stopping it. Take this in the morning on an empty stomach and then eat a snack/meal about 30-45 minutes later.  -Avoid lying down for at least two hours after a meal or after drinking acidic beverages, like soda, or other caffeinated beverages. This can help to prevent stomach contents from flowing back into the esophagus. -Keep your head elevated while you sleep. Using an extra pillow or two can also help to prevent reflux. -Eat smaller and more frequent meals each day instead of a few large meals. This promotes digestion and can aid in preventing heartburn. -Wear loose-fitting clothes to ease pressure on the stomach, which can worsen heartburn and reflux. -Reduce excess weight around the midsection. This can ease pressure on the stomach. Such pressure can force some stomach contents back up the esophagus.   Allergic Rhinitis:  - Positive skin  test 08/2022: mold  - Avoidance measures discussed. - Use nasal saline rinses before nose sprays such as with Neilmed Sinus Rinse.  Use distilled water.   - Use Flonase 2 sprays each nostril daily. Aim upward and outward. - Use Azelastine 1-2 sprays each nostril twice daily as needed for congestion, drainage, runny nose, sneezing. Aim upward and outward. - Use Zyrtec 10 mg daily.  - For eyes, use Olopatadine or Ketotifen 1 eye drop daily as needed for itchy, watery eyes.  Available over  the counter, if not covered by insurance.  - Consider allergy shots as long term control of your symptoms by teaching your immune system to be more tolerant of your allergy triggers.    Return in about 2 months (around 11/14/2022).  Alesia Morin, MD [facility]

## 2022-09-14 NOTE — Patient Instructions (Addendum)
Return in about 2 months (around 11/14/2022).   Maricela Bo Chronic Cough - Recommend stopping ACEI and trying ARB if the cough returns  - Will also work on post nasal drainage control as discussed below. - Discussed that GERD can also be contributing. - No history of asthma, spirometry today was without obstruction.    GERD - Continue omeprazole 20mg  daily for now.  If controlled at next visit, will discuss stopping it. Take this in the morning on an empty stomach and then eat a snack/meal about 30-45 minutes later.  -Avoid lying down for at least two hours after a meal or after drinking acidic beverages, like soda, or other caffeinated beverages. This can help to prevent stomach contents from flowing back into the esophagus. -Keep your head elevated while you sleep. Using an extra pillow or two can also help to prevent reflux. -Eat smaller and more frequent meals each day instead of a few large meals. This promotes digestion and can aid in preventing heartburn. -Wear loose-fitting clothes to ease pressure on the stomach, which can worsen heartburn and reflux. -Reduce excess weight around the midsection. This can ease pressure on the stomach. Such pressure can force some stomach contents back up the esophagus.   Allergic Rhinitis:  - Positive skin test 08/2022: mold  - Avoidance measures discussed. - Use nasal saline rinses before nose sprays such as with Neilmed Sinus Rinse.  Use distilled water.   - Use Flonase 2 sprays each nostril daily. Aim upward and outward. - Use Azelastine 1-2 sprays each nostril twice daily as needed for congestion, drainage, runny nose, sneezing. Aim upward and outward. - Use Zyrtec 10 mg daily.  - For eyes, use Olopatadine or Ketotifen 1 eye drop daily as needed for itchy, watery eyes.  Available over the counter, if not covered by insurance.  - Consider allergy shots as long term control of your symptoms by teaching your immune system to be more tolerant of  your allergy triggers.

## 2022-11-16 ENCOUNTER — Ambulatory Visit: Payer: BC Managed Care – PPO | Admitting: Internal Medicine

## 2022-11-16 DIAGNOSIS — J309 Allergic rhinitis, unspecified: Secondary | ICD-10-CM

## 2022-12-21 ENCOUNTER — Encounter: Payer: Self-pay | Admitting: Family Medicine

## 2022-12-21 ENCOUNTER — Ambulatory Visit: Payer: BC Managed Care – PPO | Admitting: Family Medicine

## 2022-12-21 VITALS — BP 134/85 | HR 79 | Ht 62.75 in | Wt 251.0 lb

## 2022-12-21 DIAGNOSIS — F4323 Adjustment disorder with mixed anxiety and depressed mood: Secondary | ICD-10-CM | POA: Diagnosis not present

## 2022-12-21 DIAGNOSIS — Z7689 Persons encountering health services in other specified circumstances: Secondary | ICD-10-CM | POA: Diagnosis not present

## 2022-12-21 DIAGNOSIS — I10 Essential (primary) hypertension: Secondary | ICD-10-CM | POA: Diagnosis not present

## 2022-12-21 DIAGNOSIS — R053 Chronic cough: Secondary | ICD-10-CM | POA: Diagnosis not present

## 2022-12-21 MED ORDER — DULOXETINE HCL 30 MG PO CPEP
30.0000 mg | ORAL_CAPSULE | Freq: Every day | ORAL | 3 refills | Status: AC
Start: 2022-12-21 — End: ?

## 2022-12-21 MED ORDER — LOSARTAN POTASSIUM-HCTZ 50-12.5 MG PO TABS
1.0000 | ORAL_TABLET | Freq: Every day | ORAL | 1 refills | Status: DC
Start: 2022-12-21 — End: 2023-02-22

## 2022-12-21 MED ORDER — ZEPBOUND 2.5 MG/0.5ML ~~LOC~~ SOAJ
2.5000 mg | SUBCUTANEOUS | 0 refills | Status: DC
Start: 2022-12-21 — End: 2023-02-01

## 2022-12-21 NOTE — Assessment & Plan Note (Addendum)
BP OK today.  Ideally systolic under 130.  Were getting ready to switch to losartan because of chronic cough.

## 2022-12-21 NOTE — Assessment & Plan Note (Addendum)
Visit #:1  Starting Weight: 251 lbs, BMI 44   Current weight: 251 lbs  Previous weight:  Change in weight: Goal weight:  Dietary goals: work on increasing veggies.   Exercise goals: Medication: Follow-up and referrals:

## 2022-12-21 NOTE — Progress Notes (Addendum)
Established Patient Office Visit  Subjective   Patient ID: Elizabeth Copeland, female    DOB: 26-Feb-1971  Age: 52 y.o. MRN: [id number]  Chief Complaint  Patient presents with   Hypertension    HPI  Still having a chronic cough. The allergist wanted her off her ACEi incase contributing ot her cough.  She says the omeprazole and nasal spray was really helping but cough has started to ramp back up again. She did test pos for mold allergies.   Wants to discuss weight loss medication.  We have previously tried to get [facility] covered in January and it was denied.  She would like to consider again she is still trying to really work on improving her diet and getting more healthy.  She actually has lost about 7 pounds since I last saw her which is great.  She has been out of her Cymbalta for several days. She say this is emotionally a really hard time for her.   She is also on her menstrual cycle and feels like that makes her low bit more emotional.  She says her periods have become a little bit more erratic now that she is 51.  She feels like she is entering into menopause. Hsa been doing yoga once a week.      ROS    Objective:     BP 134/85   Pulse 79   Ht 5' 2.75" (1.594 m)   Wt 251 lb (113.9 kg)   SpO2 96%   BMI 44.82 kg/m    Physical Exam Vitals and nursing note reviewed.  Constitutional:      Appearance: Normal appearance.  HENT:     Head: Normocephalic and atraumatic.  Eyes:     Conjunctiva/sclera: Conjunctivae normal.  Cardiovascular:     Rate and Rhythm: Normal rate and regular rhythm.  Pulmonary:     Effort: Pulmonary effort is normal.     Breath sounds: Normal breath sounds.  Skin:    General: Skin is warm and dry.  Neurological:     Mental Status: She is alert.  Psychiatric:        Mood and Affect: Mood normal.      No results found for any visits on 12/21/22.    The 10-year ASCVD risk score (Arnett DK, et al., 2019) is: 4.2%    Assessment &  Plan:   Problem List Items Addressed This Visit       Cardiovascular and Mediastinum   Essential hypertension, benign - Primary    BP OK today.  Ideally systolic under 130.  Were getting ready to switch to losartan because of chronic cough.      Relevant Medications   losartan-hydrochlorothiazide (HYZAAR) 50-12.5 MG tablet     Other   Situational mixed anxiety and depressive disorder    Will refill the Cymbalta and get her restarted on it.  Call if any problems or concerns she knows that she needs to try to get back in with her therapist so she will try to do that as well.  Flowsheet Row Office Visit from 12/21/2022 in [facility] at [facility]  PHQ-9 Total Score 11         12/21/2022   12:10 PM 03/07/2022    8:46 AM 06/09/2021   11:52 AM 07/23/2020    3:34 PM  GAD 7 : Generalized Anxiety Score  Nervous, Anxious, on Edge 1 2 2 2   Control/stop worrying 0 1 1 1  Worry too much - different things 1 1 1 1   Trouble relaxing 2 2 2 2   Restless 3 2 1  0  Easily annoyed or irritable 1 2 1  0  Afraid - awful might happen 0 1 0 0  Total GAD 7 Score 8 11 8 6   Anxiety Difficulty Somewhat difficult Somewhat difficult Not difficult at all Not difficult at all          Relevant Medications   DULoxetine (CYMBALTA) 30 MG capsule   Encounter for weight management    Visit #:1  Starting Weight: 251 lbs, BMI 44   Current weight: 251 lbs  Previous weight:  Change in weight: Goal weight:  Dietary goals: work on Gaffer.   Exercise goals: Medication: Follow-up and referrals:       Relevant Medications   tirzepatide (ZEPBOUND) 2.5 MG/0.5ML Pen   Chronic cough    Some improvement with the PPI.  She is using her nasal spray a couple of times a week because she really does not feel it she is having a lot of drainage issues right now.  I am happy to switch the ACE to an ARB and see if this is helpful.  We discussed the switch.  She has a  home blood pressure cuff so we will keep an eye on her blood pressure over the next couple of weeks after the change monitor for any lightheadedness or dizziness.  Will change to losartan HCT.       Return in about 8 weeks (around 02/15/2023) for Weight Management .    Nani Gasser, MD

## 2022-12-21 NOTE — Assessment & Plan Note (Signed)
Some improvement with the PPI.  She is using her nasal spray a couple of times a week because she really does not feel it she is having a lot of drainage issues right now.  I am happy to switch the ACE to an ARB and see if this is helpful.  We discussed the switch.  She has a home blood pressure cuff so we will keep an eye on her blood pressure over the next couple of weeks after the change monitor for any lightheadedness or dizziness.  Will change to losartan HCT.

## 2022-12-21 NOTE — Assessment & Plan Note (Signed)
Will refill the Cymbalta and get her restarted on it.  Call if any problems or concerns she knows that she needs to try to get back in with her therapist so she will try to do that as well.  Flowsheet Row Office Visit from 12/21/2022 in [facility] at [facility]  PHQ-9 Total Score 11         12/21/2022   12:10 PM 03/07/2022    8:46 AM 06/09/2021   11:52 AM 07/23/2020    3:34 PM  GAD 7 : Generalized Anxiety Score  Nervous, Anxious, on Edge 1 2 2 2   Control/stop worrying 0 1 1 1   Worry too much - different things 1 1 1 1   Trouble relaxing 2 2 2 2   Restless 3 2 1  0  Easily annoyed or irritable 1 2 1  0  Afraid - awful might happen 0 1 0 0  Total GAD 7 Score 8 11 8 6   Anxiety Difficulty Somewhat difficult Somewhat difficult Not difficult at all Not difficult at all

## 2022-12-21 NOTE — Addendum Note (Signed)
Addended by: Nani Gasser D on: 12/21/2022 12:36 PM   Modules accepted: Orders

## 2022-12-30 ENCOUNTER — Encounter: Payer: Self-pay | Admitting: Sports Medicine

## 2023-01-11 DIAGNOSIS — Z0289 Encounter for other administrative examinations: Secondary | ICD-10-CM

## 2023-01-29 ENCOUNTER — Other Ambulatory Visit: Payer: Self-pay | Admitting: Family Medicine

## 2023-01-29 DIAGNOSIS — I10 Essential (primary) hypertension: Secondary | ICD-10-CM

## 2023-02-01 ENCOUNTER — Encounter: Payer: Self-pay | Admitting: Family Medicine

## 2023-02-01 ENCOUNTER — Ambulatory Visit: Payer: BC Managed Care – PPO | Admitting: Family Medicine

## 2023-02-01 VITALS — BP 128/87 | HR 74 | Temp 98.0°F | Ht 63.0 in | Wt 248.0 lb

## 2023-02-01 DIAGNOSIS — R0602 Shortness of breath: Secondary | ICD-10-CM | POA: Insufficient documentation

## 2023-02-01 DIAGNOSIS — N938 Other specified abnormal uterine and vaginal bleeding: Secondary | ICD-10-CM | POA: Insufficient documentation

## 2023-02-01 DIAGNOSIS — Z1331 Encounter for screening for depression: Secondary | ICD-10-CM | POA: Diagnosis not present

## 2023-02-01 DIAGNOSIS — M549 Dorsalgia, unspecified: Secondary | ICD-10-CM | POA: Diagnosis not present

## 2023-02-01 DIAGNOSIS — K760 Fatty (change of) liver, not elsewhere classified: Secondary | ICD-10-CM | POA: Insufficient documentation

## 2023-02-01 DIAGNOSIS — I10 Essential (primary) hypertension: Secondary | ICD-10-CM | POA: Insufficient documentation

## 2023-02-01 DIAGNOSIS — Z6841 Body Mass Index (BMI) 40.0 and over, adult: Secondary | ICD-10-CM | POA: Diagnosis not present

## 2023-02-01 DIAGNOSIS — F32A Depression, unspecified: Secondary | ICD-10-CM | POA: Insufficient documentation

## 2023-02-01 DIAGNOSIS — R5383 Other fatigue: Secondary | ICD-10-CM | POA: Insufficient documentation

## 2023-02-01 DIAGNOSIS — E559 Vitamin D deficiency, unspecified: Secondary | ICD-10-CM | POA: Diagnosis not present

## 2023-02-01 DIAGNOSIS — G8929 Other chronic pain: Secondary | ICD-10-CM

## 2023-02-01 DIAGNOSIS — F3289 Other specified depressive episodes: Secondary | ICD-10-CM

## 2023-02-01 DIAGNOSIS — F5089 Other specified eating disorder: Secondary | ICD-10-CM

## 2023-02-01 NOTE — Progress Notes (Signed)
PRIMARY CARE AT [facility] WE[facility] WELLNESS AT [facility] [address] [place] [address] Dept: [phone] Dept Fax: [phone]  At a Glance:  Vitals Temp: 98 F (36.7 C) BP: 128/87 Pulse Rate: 74 SpO2: 99 %   Anthropometric Measurements Height: 5\' 3"  (1.6 m) Weight: 248 lb (112.5 kg) BMI (Calculated): 43.94 Starting Weight: 248lb Waist Measurement : 48 inches   Body Composition  Body Fat %: 49.8 % Fat Mass (lbs): 123.8 lbs Muscle Mass (lbs): 118.4 lbs Total Body Water (lbs): 89.8 lbs Visceral Fat Rating : 16   Other Clinical Data RMR: 1872 Fasting: Yes Labs: Yes Today's Visit #: 1 Starting Date: 02/01/23    EKG: Normal sinus rhythm, rate 79 bpm, no ischemia or arrhythmia.  Indirect Calorimeter completed today shows  a REE of 1872.  Her calculated basal metabolic rate is 4403 thus her basal metabolic rate is better than expected.  Chief Complaint:  Obesity   Subjective:  Elizabeth Copeland (MR# [id number]) is a 52 y.o. female who presents for evaluation and treatment of obesity and related comorbidities.   Elizabeth Copeland is currently in the action stage of change and ready to dedicate time achieving and maintaining a healthier weight. Elizabeth Copeland is interested in becoming our patient and working on intensive lifestyle modifications including (but not limited to) diet and exercise for weight loss.  Elizabeth Copeland has been struggling with her weight. She has been unsuccessful in either losing weight, maintaining weight loss, or reaching her healthy weight goal.  Elizabeth Copeland habits were reviewed today and are as follows:  Eats family meals together, Believes family will eat healthier together, Has significant food craving issues, Snacks frequently in the evenings, and Frequently skips meals   She started gaining weight after having her daughter. She has dealt with weight issues for most of her life.  She is currently at her max weight.  She  has been on multiple fad diets.  She dislikes cooking.  She is picky about vegetables.  She craves cake.  She drinks sweet tea and milk.  She craves junk food.  She eats quickly and in front of the TV.  She admits to boredom and comfort eating.  She feels judged about her eating habits.   Depression Screen Elizabeth Copeland's Food and Mood (modified PHQ-9) score was 9.     12/21/2022   12:08 PM  Depression screen PHQ 2/9  Decreased Interest 2  Down, Depressed, Hopeless 1  PHQ - 2 Score 3  Altered sleeping 2  Tired, decreased energy 3  Change in appetite 1  Feeling bad or failure about yourself  0  Trouble concentrating 2  Moving slowly or fidgety/restless 0  Suicidal thoughts 0  PHQ-9 Score 11  Difficult doing work/chores Somewhat difficult     Assessment and Plan:   1. Other fatigue Reviewed EKG from today showing normal sinus rhythm at 79 bpm.  No sign of ischemia or arrhythmia.  Epworth score 3.  Averages unknown hours of sleep at night, frequently wakes up.  Snores at night and has never had a sleep study.  Reviewed indirect calorimetry results with good metabolic rate.  Plan.  Work on Scientist, research (life sciences).  Update labs today. - EKG 12-Lead - VITAMIN D 25 Hydroxy (Vit-D Deficiency, Fractures) - TSH - T4, free - T3 - Lipid panel - Insulin, random - Hemoglobin A1c - Folate - Comprehensive metabolic panel - Vitamin B12  2. SOBOE (shortness of breath on exertion) Reviewed EKG as above.  She has chronic deconditioning from lack of regular exercise and a sedentary lifestyle.  3. Essential hypertension Blood pressure is well-controlled on losartan/HCTZ 50/12.5 mg once daily.  She denies chest pain or headache.  Plan: Continue current medications.  Look for hypertensive improvements with weight loss  4. Chronic back pain, unspecified back location, unspecified back pain laterality Currently on gabapentin 800 mg 4 times daily and duloxetine 30 mg once daily.  She reports a  history of fibromyalgia.  She has started doing some yoga stretching.  Plan: Look for improvements in back pain with weight reduction.  Slowly increase walking time.  Encouraged yoga stretching at home increase to 3 times a week.  5. NAFLD (nonalcoholic fatty liver disease) Confirmed on CT abdomen from 11/18/2021, reviewed today.  Her liver enzymes have previously been normal.  Plan: We discussed weight loss as a active plan for treatment of fatty liver disease.  Will monitor liver enzymes on labs today.  6. Vitamin D deficiency She reports a history of vitamin D deficiency and has complaints of fatigue.  She is currently not on a vitamin D supplement  Plan: Check vitamin D level today with a target goal over 50  7. Other depression with emotional eating Bariatric PHQ-9: 9.  She reports high stress levels with poor sleep at night.  Plan: Begin working on stress reduction, mindful eating.  Will work on sleep hygiene and improving sleep time at night.  Consider use of CBT and sertraline.  8. DUB (dysfunctional uterine bleeding) She reports having a uterine artery ablation done in 2018 due to uterine fibroids that are still there.  Her menses are irregular, sometimes heavy.  She reports a history of iron deficiency anemia, currently not on an iron supplement but has complaints of fatigue.  She has previously considered a hysterectomy.  Plan: Follow-up with OB/GYN.  Check iron levels today. - CBC with Differential/Platelet - Ferritin - Iron and TIBC  9. Depression screen Reviewed results of PHQ-9  10. BMI 40.0-44.9, adult Elizabeth Copeland) Reviewed obesity management plan including prescribed diet  11. Morbid obesity (HCC) Reviewed obesity management plan including prescribed diet   Elizabeth Copeland is currently in the action stage of change and her goal is to continue with weight loss efforts. I recommend Elizabeth Copeland begin the structured treatment plan as follows:  She has agreed to Category 3  Plan  Exercise goals: All adults should avoid inactivity. Some activity is better than none, and adults who participate in any amount of physical activity, gain some health benefits.  Behavioral modification strategies:increasing lean protein intake, increasing vegetables, increase H2O intake, decrease liquid calories, decreasing eating out, no skipping meals, meal planning and cooking strategies, keeping healthy foods in the home, better snacking choices, and decrease junk food   She was informed of the importance of frequent follow-up visits to maximize her success with intensive lifestyle modifications for her multiple health conditions. She was informed we would discuss her lab results at her next visit unless there is a critical issue that needs to be addressed sooner. Elizabeth Copeland agreed to keep her next visit at the agreed upon time to discuss these results.  Objective:  General: Cooperative, alert, well developed, in no acute distress. HEENT: Conjunctivae and lids unremarkable. Cardiovascular: Regular rhythm.  Lungs: Normal work of breathing. Neurologic: No focal deficits.   Lab Results  Component Value Date   CREATININE 0.91 11/18/2021   BUN 11 11/18/2021   NA 134 (L) 11/18/2021   K 4.2 11/18/2021   CL  101 11/18/2021   CO2 23 11/18/2021   Lab Results  Component Value Date   ALT 27 11/18/2021   AST 32 11/18/2021   ALKPHOS 67 11/18/2021   BILITOT 0.8 11/18/2021   Lab Results  Component Value Date   HGBA1C 5.5 12/14/2016   HGBA1C 5.3 05/06/2015   HGBA1C 5.4 01/16/2012   No results found for: "INSULIN" Lab Results  Component Value Date   TSH 1.89 06/26/2019   Lab Results  Component Value Date   CHOL 228 (H) 04/29/2021   HDL 38 (L) 04/29/2021   LDLCALC 150 (H) 04/29/2021   TRIG 236 (H) 04/29/2021   CHOLHDL 6.0 (H) 04/29/2021   Lab Results  Component Value Date   WBC 11.0 (H) 03/25/2022   HGB 14.4 03/25/2022   HCT 42.3 03/25/2022   MCV 86.0 03/25/2022   PLT 313  03/25/2022   Lab Results  Component Value Date   IRON <10 (L) 05/07/2015   TIBC 504 (H) 05/07/2015   FERRITIN 2 (L) 05/07/2015    Attestation Statements:  Reviewed by clinician on day of visit: allergies, medications, problem list, medical history, surgical history, family history, social history, and previous encounter notes.  I have personally spent 45 minutes total time today in preparation, patient care, nutritional counseling and documentation for this visit, including the following: review of clinical lab tests; review of medical tests/procedures/services.    Glennis Brink, DO

## 2023-02-03 LAB — CBC WITH DIFFERENTIAL/PLATELET
Basophils Absolute: 0.1 10*3/uL (ref 0.0–0.2)
Basos: 1 %
EOS (ABSOLUTE): 0.1 10*3/uL (ref 0.0–0.4)
Eos: 2 %
Hematocrit: 43.7 % (ref 34.0–46.6)
Hemoglobin: 14.2 g/dL (ref 11.1–15.9)
Immature Grans (Abs): 0 10*3/uL (ref 0.0–0.1)
Immature Granulocytes: 1 %
Lymphocytes Absolute: 2.1 10*3/uL (ref 0.7–3.1)
Lymphs: 23 %
MCH: 28 pg (ref 26.6–33.0)
MCHC: 32.5 g/dL (ref 31.5–35.7)
MCV: 86 fL (ref 79–97)
Monocytes Absolute: 0.6 10*3/uL (ref 0.1–0.9)
Monocytes: 6 %
Neutrophils Absolute: 6 10*3/uL (ref 1.4–7.0)
Neutrophils: 67 %
Platelets: 325 10*3/uL (ref 150–450)
RBC: 5.07 x10E6/uL (ref 3.77–5.28)
RDW: 14 % (ref 11.7–15.4)
WBC: 8.8 10*3/uL (ref 3.4–10.8)

## 2023-02-03 LAB — COMPREHENSIVE METABOLIC PANEL
ALT: 21 [IU]/L (ref 0–32)
AST: 23 [IU]/L (ref 0–40)
Albumin: 4.2 g/dL (ref 3.8–4.9)
Alkaline Phosphatase: 96 [IU]/L (ref 44–121)
BUN/Creatinine Ratio: 14 (ref 9–23)
BUN: 12 mg/dL (ref 6–24)
Bilirubin Total: 0.5 mg/dL (ref 0.0–1.2)
CO2: 22 mmol/L (ref 20–29)
Calcium: 9.4 mg/dL (ref 8.7–10.2)
Chloride: 99 mmol/L (ref 96–106)
Creatinine, Ser: 0.87 mg/dL (ref 0.57–1.00)
Globulin, Total: 2.8 g/dL (ref 1.5–4.5)
Glucose: 86 mg/dL (ref 70–99)
Potassium: 4.2 mmol/L (ref 3.5–5.2)
Sodium: 136 mmol/L (ref 134–144)
Total Protein: 7 g/dL (ref 6.0–8.5)
eGFR: 80 mL/min/{1.73_m2} (ref >59)

## 2023-02-03 LAB — IRON AND TIBC
Iron Saturation: 14 % — ABNORMAL LOW (ref 15–55)
Iron: 61 ug/dL (ref 27–159)
Total Iron Binding Capacity: 422 ug/dL (ref 250–450)
UIBC: 361 ug/dL (ref 131–425)

## 2023-02-03 LAB — VITAMIN B12: Vitamin B-12: 437 pg/mL (ref 232–1245)

## 2023-02-03 LAB — INSULIN, RANDOM: INSULIN: 22.2 u[IU]/mL (ref 2.6–24.9)

## 2023-02-03 LAB — FERRITIN: Ferritin: 26 ng/mL (ref 15–150)

## 2023-02-03 LAB — LIPID PANEL
Chol/HDL Ratio: 6.7 ratio — ABNORMAL HIGH (ref 0.0–4.4)
Cholesterol, Total: 261 mg/dL — ABNORMAL HIGH (ref 100–199)
HDL: 39 mg/dL — ABNORMAL LOW
LDL Chol Calc (NIH): 185 mg/dL — ABNORMAL HIGH (ref 0–99)
Triglycerides: 193 mg/dL — ABNORMAL HIGH (ref 0–149)
VLDL Cholesterol Cal: 37 mg/dL (ref 5–40)

## 2023-02-03 LAB — HEMOGLOBIN A1C
Est. average glucose Bld gHb Est-mCnc: 117 mg/dL
Hgb A1c MFr Bld: 5.7 % — ABNORMAL HIGH (ref 4.8–5.6)

## 2023-02-03 LAB — TSH: TSH: 1.55 u[IU]/mL (ref 0.450–4.500)

## 2023-02-03 LAB — T3: T3, Total: 157 ng/dL (ref 71–180)

## 2023-02-03 LAB — T4, FREE: Free T4: 1.06 ng/dL (ref 0.82–1.77)

## 2023-02-03 LAB — FOLATE: Folate: 3.3 ng/mL (ref >3.0)

## 2023-02-03 LAB — VITAMIN D 25 HYDROXY (VIT D DEFICIENCY, FRACTURES): Vit D, 25-Hydroxy: 23.4 ng/mL — ABNORMAL LOW (ref 30.0–100.0)

## 2023-02-15 ENCOUNTER — Encounter: Payer: Self-pay | Admitting: Family Medicine

## 2023-02-15 ENCOUNTER — Other Ambulatory Visit: Payer: Self-pay | Admitting: Family Medicine

## 2023-02-15 ENCOUNTER — Ambulatory Visit: Payer: BC Managed Care – PPO | Admitting: Family Medicine

## 2023-02-15 VITALS — BP 136/89 | HR 74 | Temp 98.5°F | Ht 63.0 in | Wt 245.0 lb

## 2023-02-15 DIAGNOSIS — E782 Mixed hyperlipidemia: Secondary | ICD-10-CM | POA: Insufficient documentation

## 2023-02-15 DIAGNOSIS — D500 Iron deficiency anemia secondary to blood loss (chronic): Secondary | ICD-10-CM

## 2023-02-15 DIAGNOSIS — E88819 Insulin resistance, unspecified: Secondary | ICD-10-CM

## 2023-02-15 DIAGNOSIS — Z6841 Body Mass Index (BMI) 40.0 and over, adult: Secondary | ICD-10-CM

## 2023-02-15 DIAGNOSIS — R7303 Prediabetes: Secondary | ICD-10-CM | POA: Insufficient documentation

## 2023-02-15 DIAGNOSIS — E559 Vitamin D deficiency, unspecified: Secondary | ICD-10-CM | POA: Diagnosis not present

## 2023-02-15 DIAGNOSIS — E538 Deficiency of other specified B group vitamins: Secondary | ICD-10-CM | POA: Insufficient documentation

## 2023-02-15 DIAGNOSIS — E66813 Obesity, class 3: Secondary | ICD-10-CM

## 2023-02-15 MED ORDER — POLYSACCHARIDE IRON COMPLEX 150 MG PO CAPS
150.0000 mg | ORAL_CAPSULE | Freq: Every day | ORAL | 0 refills | Status: DC
Start: 2023-02-15 — End: 2023-02-22

## 2023-02-15 MED ORDER — METFORMIN HCL ER 500 MG PO TB24: 500.0000 mg | ORAL_TABLET | Freq: Every day | ORAL | 0 refills | Status: DC

## 2023-02-15 MED ORDER — VITAMIN D (ERGOCALCIFEROL) 1.25 MG (50000 UNIT) PO CAPS: 50000.0000 [IU] | ORAL_CAPSULE | ORAL | 0 refills | Status: DC

## 2023-02-15 NOTE — Assessment & Plan Note (Signed)
Lab Results  Component Value Date   CHOL 261 (H) 02/01/2023   HDL 39 (L) 02/01/2023   LDLCALC 185 (H) 02/01/2023   TRIG 193 (H) 02/01/2023   CHOLHDL 6.7 (H) 02/01/2023  Reviewed lab results with patient.  She has findings of both metabolic syndrome with a high triglycerides and a low HDL as well as a high LDL cholesterol.  She has never taken lipid-lowering medication.  She has started working on her prescribed diet which is low in saturated fat.  She has moderate cardiovascular risk factors.  Will CC her PCP to discuss potentially treating with statin therapy.  The 10-year ASCVD risk score (Arnett DK, et al., 2019) is: 4.9%   Values used to calculate the score:     Age: 52 years     Sex: Female     Is Non-Hispanic African American: No     Diabetic: No     Tobacco smoker: No     Systolic Blood Pressure: 136 mmHg     Is BP treated: Yes     HDL Cholesterol: 39 mg/dL     Total Cholesterol: 261 mg/dL

## 2023-02-15 NOTE — Patient Instructions (Addendum)
Be sure to get in all the food at mealtime You can rotate in [employer] Delites breakfast   You can swap out the bread at breakfast for one fresh fruit serving (any)  You can swap out the bread at lunch for salad greens (unlimited)  Lower carb options to add in : Low carb tortillas Joseph's Lavish bread 1/2 sweet potato or baked potato One serving of beans One serving of Barilla protein pasta or Banza chickpea pasta  Continue taper down on sugary drinks Swap out for sugar free options

## 2023-02-15 NOTE — Assessment & Plan Note (Signed)
Reviewed lab results with patient.  Her fasting insulin is elevated at 22.2 and she has signs of insulin resistance including carb and sugar cravings and polyphasia.  She has never taken metformin.  She had brief use of a GLP-1 receptor agonist well covered by her insurance for obesity management.  She has been working on reducing her intake of added sugar and starches.  She has not yet added in regular exercise.  We discussed working on insulin sensitivity with reducing added sugar, reducing excess starches, increasing walking time.  She is agreeable to starting metformin 500 mg XR once daily with food to aid in insulin resistance and prediabetes.  We discussed potential adverse side effects.

## 2023-02-15 NOTE — Assessment & Plan Note (Signed)
lab results reviewed with patient.  Her folic acid level is low normal at 3.3.  She is fairly picky about consumption of green leafy vegetables.  She has recently started to increase her intake of green leafy vegetables.  She is not anemic based on her lab results.  Recommend swapping out a multivitamin for a prenatal vitamin once daily to give her extra folic acid.  Will increase her intake of green leafy vegetables as reviewed today.

## 2023-02-15 NOTE — Progress Notes (Signed)
Office: [phone]  /  Fax: [phone]  WEIGHT SUMMARY AND BIOMETRICS  Starting Date: 02/01/23  Starting Weight: 248lb   Weight Lost Since Last Visit: 3lb   Vitals Temp: 98.5 F (36.9 C) BP: 136/89 Pulse Rate: 74 SpO2: 98 %   Body Composition  Body Fat %: 49.2 % Fat Mass (lbs): 120.8 lbs Muscle Mass (lbs): 118.4 lbs Total Body Water (lbs): 87.4 lbs Visceral Fat Rating : 16   HPI  Chief Complaint: OBESITY  Elizabeth Copeland is here to discuss her progress with her obesity treatment plan. She is on the the Category 3 Plan and states she is following her eating plan approximately 40 % of the time. She states she is doing yoga 60 minutes 1 times per week.   Interval History:  Since last office visit she is down 3 lb She is maintaining her lean muscle mass She has a lot of sugar cravings related to her menses She is getting in all 3 meals but has not been eating the bread at breakfast and lunch She has been using her snack calories at night She did move her fruit serving to morning She is missing starches with dinner  She has been trying to get in more vegetables She is doing yoga once a week  Pharmacotherapy: none  PHYSICAL EXAM:  Blood pressure 136/89, pulse 74, temperature 98.5 F (36.9 C), height 5\' 3"  (1.6 m), weight 245 lb (111.1 kg), SpO2 98%. Body mass index is 43.4 kg/m.  General: She is overweight, cooperative, alert, well developed, and in no acute distress. PSYCH: Has normal mood, affect and thought process.   Lungs: Normal breathing effort, no conversational dyspnea.   ASSESSMENT AND PLAN  TREATMENT PLAN FOR OBESITY:  Recommended Dietary Goals  [name] is currently in the action stage of change. As such, her goal is to continue weight management plan. She has agreed to the Category 3 Plan. - she may swap out bread on plan for a fresh fruit serving at breakfast OR choose a Vilma [employer] Delite breakfast sandwich - she may swap out bread at lunch  for salad greens (unlimited) - reviewed specific food swap outs on AVS with patient  Behavioral Intervention  We discussed the following Behavioral Modification Strategies today: increasing lean protein intake to established goals, decreasing simple carbohydrates , increasing vegetables, increasing lower glycemic fruits, increasing fiber rich foods, avoiding skipping meals, increasing water intake , work on meal planning and preparation, reading food labels , keeping healthy foods at home, identifying sources and decreasing liquid calories, continue to practice mindfulness when eating, planning for success, and continue to work on maintaining a reduced calorie state, getting the recommended amount of protein, incorporating whole foods, making healthy choices, staying well hydrated and practicing mindfulness when eating..  Additional resources provided today: NA  Recommended Physical Activity Goals  [name] has been advised to work up to 150 minutes of moderate intensity aerobic activity a week and strengthening exercises 2-3 times per week for cardiovascular health, weight loss maintenance and preservation of muscle mass.   She has agreed to Think about enjoyable ways to increase daily physical activity and overcoming barriers to exercise and Increase physical activity in their day and reduce sedentary time (increase NEAT).  Pharmacotherapy changes for the treatment of obesity: begin metformin Xr 500 mg once daily with food  ASSOCIATED CONDITIONS ADDRESSED TODAY  Iron deficiency anemia due to chronic blood loss Assessment & Plan: Reviewed lab results with patient.  Her iron sat is low at  14%.  She has complaints of fatigue.  She has a history of dysfunctional uterine bleeding.  She has required IV iron infusions last in 2017.  She has been intolerant to over-the-counter ferrous sulfate with GI upset.  She is currently not on an iron supplement.  Recommend following up with OB/GYN for  dysfunctional uterine bleeding which is the cause of her iron deficiency anemia.  She reports her last colonoscopy was done in 2021.  Begin iron polysaccharide 150 mg capsule daily and recheck iron levels in 3 months  Orders: -     Polysaccharide Iron Complex; Take 1 capsule (150 mg total) by mouth daily.  Dispense: 30 capsule; Refill: 0  Vitamin D deficiency Assessment & Plan: Last vitamin D Lab Results  Component Value Date   VD25OH 23.4 (L) 02/01/2023   Reviewed lab results with patient.  She is vitamin D deficient.  We discussed problems of vitamin D deficiency including fatigue, bone loss and poor immune function.  She is currently not on a vitamin D supplement.  We discussed a target goal over 50.  Begin vitamin D 50,000 IU once weekly and recheck level in 3 to 4 months  Orders: -     Vitamin D (Ergocalciferol); Take 1 capsule (50,000 Units total) by mouth every 7 (seven) days.  Dispense: 5 capsule; Refill: 0  Insulin resistance Assessment & Plan: Reviewed lab results with patient.  Her fasting insulin is elevated at 22.2 and she has signs of insulin resistance including carb and sugar cravings and polyphasia.  She has never taken metformin.  She had brief use of a GLP-1 receptor agonist well covered by her insurance for obesity management.  She has been working on reducing her intake of added sugar and starches.  She has not yet added in regular exercise.  We discussed working on insulin sensitivity with reducing added sugar, reducing excess starches, increasing walking time.  She is agreeable to starting metformin 500 mg XR once daily with food to aid in insulin resistance and prediabetes.  We discussed potential adverse side effects.  Orders: -     metFORMIN HCl ER; Take 1 tablet (500 mg total) by mouth daily with breakfast.  Dispense: 30 tablet; Refill: 0  Class 3 severe obesity due to excess calories with serious comorbidity and body mass index (BMI) of 40.0 to 44.9 in adult  [name])  Prediabetes Assessment & Plan: Lab Results  Component Value Date   HGBA1C 5.7 (H) 02/01/2023   Reviewed lab results with patient.  A1c 5.7 in the prediabetic range.  She reports a positive family history for type 2 diabetes.  Continue working on prescribed dietary changes.  Work on increasing regular exercise and weight reduction.  Begin metformin XR 500 mg once daily with food.   Mixed hyperlipidemia Assessment & Plan: Lab Results  Component Value Date   CHOL 261 (H) 02/01/2023   HDL 39 (L) 02/01/2023   LDLCALC 185 (H) 02/01/2023   TRIG 193 (H) 02/01/2023   CHOLHDL 6.7 (H) 02/01/2023  Reviewed lab results with patient.  She has findings of both metabolic syndrome with a high triglycerides and a low HDL as well as a high LDL cholesterol.  She has never taken lipid-lowering medication.  She has started working on her prescribed diet which is low in saturated fat.  She has moderate cardiovascular risk factors.  Will CC her PCP to discuss potentially treating with statin therapy.  The 10-year ASCVD risk score (Arnett DK, et al., 2019) is:  4.9%   Values used to calculate the score:     Age: 44 years     Sex: Female     Is Non-Hispanic African American: No     Diabetic: No     Tobacco smoker: No     Systolic Blood Pressure: 136 mmHg     Is BP treated: Yes     HDL Cholesterol: 39 mg/dL     Total Cholesterol: 261 mg/dL    Low folic acid Assessment & Plan:  lab results reviewed with patient.  Her folic acid level is low normal at 3.3.  She is fairly picky about consumption of green leafy vegetables.  She has recently started to increase her intake of green leafy vegetables.  She is not anemic based on her lab results.  Recommend swapping out a multivitamin for a prenatal vitamin once daily to give her extra folic acid.  Will increase her intake of green leafy vegetables as reviewed today.       She was informed of the importance of frequent follow up visits to maximize  her success with intensive lifestyle modifications for her multiple health conditions.   ATTESTASTION STATEMENTS:  Reviewed by clinician on day of visit: allergies, medications, problem list, medical history, surgical history, family history, social history, and previous encounter notes pertinent to obesity diagnosis.   I have personally spent 30 minutes total time today in preparation, patient care, nutritional counseling and documentation for this visit, including the following: review of clinical lab tests; review of medical tests/procedures/services.      Glennis Brink, DO DABFM, DABOM [facility] [address] [place], [place] [address] [phone]

## 2023-02-15 NOTE — Assessment & Plan Note (Signed)
Reviewed lab results with patient.  Her iron sat is low at 14%.  She has complaints of fatigue.  She has a history of dysfunctional uterine bleeding.  She has required IV iron infusions last in 2017.  She has been intolerant to over-the-counter ferrous sulfate with GI upset.  She is currently not on an iron supplement.  Recommend following up with OB/GYN for dysfunctional uterine bleeding which is the cause of her iron deficiency anemia.  She reports her last colonoscopy was done in 2021.  Begin iron polysaccharide 150 mg capsule daily and recheck iron levels in 3 months

## 2023-02-15 NOTE — Assessment & Plan Note (Signed)
Lab Results  Component Value Date   HGBA1C 5.7 (H) 02/01/2023   Reviewed lab results with patient.  A1c 5.7 in the prediabetic range.  She reports a positive family history for type 2 diabetes.  Continue working on prescribed dietary changes.  Work on increasing regular exercise and weight reduction.  Begin metformin XR 500 mg once daily with food.

## 2023-02-15 NOTE — Assessment & Plan Note (Signed)
Last vitamin D Lab Results  Component Value Date   VD25OH 23.4 (L) 02/01/2023   Reviewed lab results with patient.  She is vitamin D deficient.  We discussed problems of vitamin D deficiency including fatigue, bone loss and poor immune function.  She is currently not on a vitamin D supplement.  We discussed a target goal over 50.  Begin vitamin D 50,000 IU once weekly and recheck level in 3 to 4 months

## 2023-02-22 ENCOUNTER — Ambulatory Visit: Payer: BC Managed Care – PPO | Admitting: Obstetrics and Gynecology

## 2023-02-22 ENCOUNTER — Ambulatory Visit: Payer: BC Managed Care – PPO | Admitting: Family Medicine

## 2023-02-22 ENCOUNTER — Encounter: Payer: Self-pay | Admitting: Family Medicine

## 2023-02-22 VITALS — BP 124/74 | HR 75 | Ht 63.0 in | Wt 247.0 lb

## 2023-02-22 DIAGNOSIS — Z6841 Body Mass Index (BMI) 40.0 and over, adult: Secondary | ICD-10-CM

## 2023-02-22 DIAGNOSIS — T50905A Adverse effect of unspecified drugs, medicaments and biological substances, initial encounter: Secondary | ICD-10-CM

## 2023-02-22 DIAGNOSIS — I10 Essential (primary) hypertension: Secondary | ICD-10-CM

## 2023-02-22 DIAGNOSIS — K760 Fatty (change of) liver, not elsewhere classified: Secondary | ICD-10-CM

## 2023-02-22 DIAGNOSIS — Z7689 Persons encountering health services in other specified circumstances: Secondary | ICD-10-CM

## 2023-02-22 MED ORDER — LOSARTAN POTASSIUM-HCTZ 50-12.5 MG PO TABS: 1.0000 | ORAL_TABLET | Freq: Every day | ORAL | 1 refills | Status: DC

## 2023-02-22 NOTE — Assessment & Plan Note (Signed)
Visit #:1  Starting Weight: 251 lbs, BMI 44    Current weight: 2 Previous weight: 252 lbs  Change in weight: Goal weight:  Dietary goals: work on increasing veggies.   Exercise goals: Medication: Follow-up and referrals:8 weeks

## 2023-02-22 NOTE — Assessment & Plan Note (Signed)
Currently i working with health weight and wellness.  Her biggest struggle is still her cravings for sweets.  It really ramped up when she has her menstrual cycle and unfortunately as she is transitioning into menopause she has been having very frequent periods.  She has an appointment coming up with OB/GYN for her Pap smear and routine care encouraged her to discuss it with them as well.

## 2023-02-22 NOTE — Progress Notes (Signed)
   Established Patient Office Visit  Subjective   Patient ID: Elizabeth Copeland, female    DOB: [birth year]  Age: 52 y.o. MRN: [id number]  Chief Complaint  Patient presents with   Hypertension    HPI Hypertension- Pt denies chest pain, SOB, dizziness, or heart palpitations.  Taking meds as directed w/o problems.  Denies medication side effects.  Cough has resolved that she had been experiencing for the last year on the lisinopril.  She is doing great with the losartan no problems concerns etc.     ROS    Objective:     BP 124/74   Pulse 75   Ht 5\' 3"  (1.6 m)   Wt 247 lb (112 kg)   SpO2 97%   BMI 43.75 kg/m    Physical Exam Vitals and nursing note reviewed.  Constitutional:      Appearance: Normal appearance.  HENT:     Head: Normocephalic and atraumatic.  Eyes:     Conjunctiva/sclera: Conjunctivae normal.  Cardiovascular:     Rate and Rhythm: Normal rate and regular rhythm.  Pulmonary:     Effort: Pulmonary effort is normal.     Breath sounds: Normal breath sounds.  Skin:    General: Skin is warm and dry.  Neurological:     Mental Status: She is alert.  Psychiatric:        Mood and Affect: Mood normal.      No results found for any visits on 02/22/23.    The 10-year ASCVD risk score (Arnett DK, et al., 2019) is: 4.1%    Assessment & Plan:   Problem List Items Addressed This Visit       Cardiovascular and Mediastinum   Essential hypertension, benign    Blood pressure looks phenomenal today and cough has resolved which is absolutely fantastic.  Added lisinopril to intolerance list.      Relevant Medications   losartan-hydrochlorothiazide (HYZAAR) 50-12.5 MG tablet     Digestive   Fatty liver     Other   Encounter for weight management - Primary    Visit #:1  Starting Weight: 251 lbs, BMI 44    Current weight: 2 Previous weight: 252 lbs  Change in weight: Goal weight:  Dietary goals: work on increasing veggies.   Exercise  goals: Medication: Follow-up and referrals:8 weeks       BMI 40.0-44.9, adult (HCC)    Currently i working with health weight and wellness.  Her biggest struggle is still her cravings for sweets.  It really ramped up when she has her menstrual cycle and unfortunately as she is transitioning into menopause she has been having very frequent periods.  She has an appointment coming up with OB/GYN for her Pap smear and routine care encouraged her to discuss it with them as well.      Other Visit Diagnoses     Medication side effect, initial encounter           Return in about 6 months (around 08/22/2023) for Hypertension.    Nani Gasser, MD

## 2023-02-22 NOTE — Assessment & Plan Note (Signed)
Blood pressure looks phenomenal today and cough has resolved which is absolutely fantastic.  Added lisinopril to intolerance list.

## 2023-03-08 ENCOUNTER — Other Ambulatory Visit (HOSPITAL_COMMUNITY)
Admission: RE | Admit: 2023-03-08 | Discharge: 2023-03-08 | Disposition: A | Discharge status: Home / Self Care | Payer: BC Managed Care – PPO | Source: Ambulatory Visit | Attending: Obstetrics and Gynecology | Admitting: Obstetrics and Gynecology

## 2023-03-08 ENCOUNTER — Encounter: Payer: Self-pay | Admitting: Obstetrics and Gynecology

## 2023-03-08 ENCOUNTER — Ambulatory Visit (INDEPENDENT_AMBULATORY_CARE_PROVIDER_SITE_OTHER): Payer: BC Managed Care – PPO | Admitting: Obstetrics and Gynecology

## 2023-03-08 VITALS — BP 133/79 | HR 98 | Resp 16 | Ht 63.0 in | Wt 250.0 lb

## 2023-03-08 DIAGNOSIS — N920 Excessive and frequent menstruation with regular cycle: Secondary | ICD-10-CM

## 2023-03-08 DIAGNOSIS — Z01419 Encounter for gynecological examination (general) (routine) without abnormal findings: Secondary | ICD-10-CM

## 2023-03-08 DIAGNOSIS — Z1151 Encounter for screening for human papillomavirus (HPV): Secondary | ICD-10-CM | POA: Diagnosis not present

## 2023-03-08 MED ORDER — TRANEXAMIC ACID 650 MG PO TABS: 1300.0000 mg | ORAL_TABLET | Freq: Three times a day (TID) | ORAL | 12 refills | Status: AC

## 2023-03-08 NOTE — Progress Notes (Signed)
ANNUAL EXAM Patient name: Elizabeth Copeland MRN [id number]  Date of birth: Oct 15, 1970 Chief Complaint:   Annual Exam  History of Present Illness:   Elizabeth Copeland is a 52 y.o. G1P1 female being seen today for a routine annual exam.   Current concerns: Heavy periods. Had [place] to help with bleeding and fibroids.  Period comes every month to twice month. Sometimes skips a month. This is not new for her and has been this way since she was younger. She denies other symptoms of menopause. Her mom went through menopause in her 78s but unsure when exactly.  Lasting 7 days. First 4-5 days heaviest - changes every couple hours pads  She had an [place] one year ago and it was unchanged from her baseline.   No LMP recorded.  Last MXR: 04/2022 Last Pap/Pap History:  2020: pap/hpv wnl  Review of Systems:   Pertinent items are noted in HPI Denies any headaches, blurred vision, fatigue, shortness of breath, chest pain, abdominal pain, abnormal vaginal discharge/itching/odor/irritation, problems with periods, bowel movements, urination, or intercourse unless otherwise stated above.  Pertinent History Reviewed:  Reviewed past medical,surgical, social and family history.  Reviewed problem list, medications and allergies. Physical Assessment:   Vitals:   03/08/23 1411  BP: 133/79  Pulse: 98  Resp: 16  Weight: 250 lb (113.4 kg)  Height: 5\' 3"  (1.6 m)  Body mass index is 44.29 kg/m.   Physical Examination:  General appearance - well appearing, and in no distress Mental status - alert, oriented to person, place, and time Psych:  She has a normal mood and affect Skin - warm and dry, normal color, no suspicious lesions noted Chest - effort normal Heart - normal rate  Breasts - breasts appear normal, no suspicious masses, no skin or nipple changes or axillary nodes Abdomen - soft, nontender, nondistended, no masses or organomegaly Pelvic -  VULVA: normal appearing vulva with no masses, tenderness  or lesions  VAGINA: normal appearing vagina with normal color and discharge, no lesions  CERVIX: normal appearing cervix without discharge or lesions, no CMT UTERUS: uterus is felt to be normal size, shape, consistency and nontender  ADNEXA: No adnexal masses or tenderness noted. Extremities:  No swelling or varicosities noted  Chaperone present for exam  No results found for this or any previous visit (from the past 24 hour(s)).  Assessment & Plan:  [name] was seen today for annual exam.  Diagnoses and all orders for this visit:  Encounter for annual routine gynecological examination - Cervical cancer screening: Discussed guidelines. Pap with HPV done - Breast Health: Encouraged self breast awareness/SBE. Discussed limits of clinical breast exam for detecting breast cancer. Discussed importance of annual MXR. Rx given for MXR - Climacteric/Sexual health: Reviewed typical and atypical symptoms of menopause/peri-menopause. Discussed PMB and to call if any amount of spotting.  - Colonoscopy: 2021 - F/U 12 months and prn -     Cytology - PAP -     MM 3D SCREENING MAMMOGRAM BILATERAL BREAST; Future  Menorrhagia with regular cycle Discussed options at this point, I.e. TXA, hormonal therapy, ablation and hysterectomy. Discussed for procedures would recommend EMB prior to them. Reviewed given how high her cervix is, may not be able to reach her cervix even with long pederson - could fully visualize cervix today (saw some of posterior lip). If that is the case, I would recommend D&C, hysteroscopy in OR.  She would like to try TXA after discussed pros/cons of it.  If no sufficient impact on her cycles, she will call and return for EMB attempt in the office.  -     tranexamic acid (LYSTEDA) 650 MG TABS tablet; Take 2 tablets (1,300 mg total) by mouth 3 (three) times daily. Take during menses for a maximum of five days   Orders Placed This Encounter  Procedures   MM 3D SCREENING MAMMOGRAM  BILATERAL BREAST    Meds:  Meds ordered this encounter  Medications   tranexamic acid (LYSTEDA) 650 MG TABS tablet    Sig: Take 2 tablets (1,300 mg total) by mouth 3 (three) times daily. Take during menses for a maximum of five days    Dispense:  30 tablet    Refill:  12    Follow-up: No follow-ups on file.  Milas Hock, MD 03/08/2023 2:41 PM

## 2023-03-10 LAB — CYTOLOGY - PAP
Adequacy: ABSENT
Comment: NEGATIVE
Diagnosis: NEGATIVE
High risk HPV: NEGATIVE

## 2023-03-15 ENCOUNTER — Ambulatory Visit: Payer: BC Managed Care – PPO | Admitting: Family Medicine

## 2023-03-20 ENCOUNTER — Ambulatory Visit: Payer: BC Managed Care – PPO | Admitting: Family Medicine

## 2023-03-20 ENCOUNTER — Encounter: Payer: Self-pay | Admitting: Family Medicine

## 2023-03-20 ENCOUNTER — Other Ambulatory Visit: Payer: Self-pay | Admitting: Family Medicine

## 2023-03-20 VITALS — BP 138/84 | HR 85 | Temp 98.6°F | Ht 63.0 in | Wt 245.0 lb

## 2023-03-20 DIAGNOSIS — D500 Iron deficiency anemia secondary to blood loss (chronic): Secondary | ICD-10-CM

## 2023-03-20 DIAGNOSIS — E88819 Insulin resistance, unspecified: Secondary | ICD-10-CM

## 2023-03-20 DIAGNOSIS — R632 Polyphagia: Secondary | ICD-10-CM | POA: Diagnosis not present

## 2023-03-20 DIAGNOSIS — E66813 Obesity, class 3: Secondary | ICD-10-CM

## 2023-03-20 DIAGNOSIS — E559 Vitamin D deficiency, unspecified: Secondary | ICD-10-CM | POA: Diagnosis not present

## 2023-03-20 DIAGNOSIS — Z6841 Body Mass Index (BMI) 40.0 and over, adult: Secondary | ICD-10-CM

## 2023-03-20 MED ORDER — VITAMIN D (ERGOCALCIFEROL) 1.25 MG (50000 UNIT) PO CAPS
50000.0000 [IU] | ORAL_CAPSULE | ORAL | 0 refills | Status: AC
Start: 2023-03-20 — End: ?

## 2023-03-20 MED ORDER — METFORMIN HCL 500 MG PO TABS
ORAL_TABLET | ORAL | 0 refills | Status: AC
Start: 2023-03-20 — End: ?

## 2023-03-20 NOTE — Assessment & Plan Note (Signed)
Worsened due to acute stressor of losing her job.  She has a good support system.  Her sleep has been inconsistent.  She has been indulging more on starches and sweets which has only worsened her hyperphasia.  We discussed how this works with insulin resistance.  Recommend cutting out intake of ultra processed food while focusing on lean protein and fiber with meals, hydrating properly especially in the morning during her fasting window.  Continue to practice mindful eating.  Avoid use of stimulant type antiobesity medication due to her high levels of anxiety.  She does lack insurance coverage for antiobesity medications.

## 2023-03-20 NOTE — Progress Notes (Signed)
Office: [phone]  /  Fax: [phone]  WEIGHT SUMMARY AND BIOMETRICS  Starting Date: 02/01/23  Starting Weight: 248lb   Weight Lost Since Last Visit: 0lb   Vitals Temp: 98.6 F (37 C) BP: 138/84 Pulse Rate: 85 SpO2: 99 %   Body Composition  Body Fat %: 49.4 % Fat Mass (lbs): 121.2 lbs Muscle Mass (lbs): 118 lbs Total Body Water (lbs): 88.8 lbs Visceral Fat Rating : 16     HPI  Chief Complaint: OBESITY  Elizabeth Copeland is here to discuss her progress with her obesity treatment plan. She is on the the Category 3 Plan and states she is following her eating plan approximately 25 % of the time. She states she is exercising 0 minutes 0 times per week.   Interval History:  Since last office visit she is down 0 lb This gives her a net weight loss of 3 lb in the past 1.5 mos She did indulge a little over Thanksgiving with extra starches for a few days Sugar cravings are still there She recently lost her job Stress levels have been high due to this Her family is supportive She did start on metformin XR 500 mg once daily with food but this caused diarrhea no matter what she ate with abdominal cramping.  She moved it to QOD She is still skipping breakfast with no morning appetite She did overeat at Thanksgiving She is doing Yoga once a week and trying to walk  Pharmacotherapy: metformin XR 500 mg once daily  PHYSICAL EXAM:  Blood pressure 138/84, pulse 85, temperature 98.6 F (37 C), height 5\' 3"  (1.6 m), weight 245 lb (111.1 kg), SpO2 99%. Body mass index is 43.4 kg/m.  General: She is overweight, cooperative, alert, well developed, and in no acute distress. PSYCH: Has normal mood, affect and thought process.   Lungs: Normal breathing effort, no conversational dyspnea.   ASSESSMENT AND PLAN  TREATMENT PLAN FOR OBESITY:  Recommended Dietary Goals  Elizabeth Copeland is currently in the action stage of change. As such, her goal is to continue weight management plan. She  has agreed to the Category 3 Plan.  Behavioral Intervention  We discussed the following Behavioral Modification Strategies today: increasing lean protein intake to established goals, increasing water intake , work on meal planning and preparation, keeping healthy foods at home, decreasing eating out or consumption of processed foods, and making healthy choices when eating convenient foods, practice mindfulness eating and understand the difference between hunger signals and cravings, work on managing stress, creating time for self-care and relaxation, avoiding temptations and identifying enticing environmental cues, continue to work on implementation of reduced calorie nutritional plan, continue to practice mindfulness when eating, and continue to work on maintaining a reduced calorie state, getting the recommended amount of protein, incorporating whole foods, making healthy choices, staying well hydrated and practicing mindfulness when eating..  Additional resources provided today: NA  Recommended Physical Activity Goals  Elizabeth Copeland has been advised to work up to 150 minutes of moderate intensity aerobic activity a week and strengthening exercises 2-3 times per week for cardiovascular health, weight loss maintenance and preservation of muscle mass.   She has agreed to Think about enjoyable ways to increase daily physical activity and overcoming barriers to exercise and Increase physical activity in their day and reduce sedentary time (increase NEAT).  Pharmacotherapy changes for the treatment of obesity: changed metformin to 500 mg 1/2 tab once daily with food  ASSOCIATED CONDITIONS ADDRESSED TODAY  Insulin resistance Assessment & Plan:  She is still experiencing cravings for carbs and sugar likely worsened by hyperinsulinemia and indulging more frequently on refined carbohydrates and ultra processed foods.  She did start on metformin XR 500 mg once daily with food but has had some abdominal cramping  and diarrhea despite what she eats.  She has moved her metformin XR to q. OD.  Continue to work on prescribed meal plan Will be working to increase exercise frequency in the coming months Change metformin to short acting metformin 250 mg once daily with food  Orders: -     metFORMIN HCl; 1/2 tab po once daily with food  Dispense: 15 tablet; Refill: 0  Vitamin D deficiency Assessment & Plan: Last vitamin D Lab Results  Component Value Date   VD25OH 23.4 (L) 02/01/2023   She is taking vitamin D 50,000 IU once weekly.  Energy level is starting to improve.  Continue vitamin D 50,000 IU once weekly.  Recheck level in February  Orders: -     Vitamin D (Ergocalciferol); Take 1 capsule (50,000 Units total) by mouth every 7 (seven) days.  Dispense: 5 capsule; Refill: 0  Class 3 severe obesity due to excess calories with body mass index (BMI) of 40.0 to 44.9 in adult, unspecified whether serious comorbidity present (HCC)  Iron deficiency anemia due to chronic blood loss Assessment & Plan: Iron deficiency anemia related to dysfunctional uterine bleeding.  She has seen OB/GYN and has been started on Lysteda.  She was unable to get Niferex prescription due to lack of insurance coverage.  She does have iron deficiency anemia.  She has complaints of fatigue.  Recommended over-the-counter iron 65 mg daily mixed with vitamin C   Polyphagia Assessment & Plan: Worsened due to acute stressor of losing her job.  She has a good support system.  Her sleep has been inconsistent.  She has been indulging more on starches and sweets which has only worsened her hyperphasia.  We discussed how this works with insulin resistance.  Recommend cutting out intake of ultra processed food while focusing on lean protein and fiber with meals, hydrating properly especially in the morning during her fasting window.  Continue to practice mindful eating.  Avoid use of stimulant type antiobesity medication due to her high  levels of anxiety.  She does lack insurance coverage for antiobesity medications.       She was informed of the importance of frequent follow up visits to maximize her success with intensive lifestyle modifications for her multiple health conditions.   ATTESTASTION STATEMENTS:  Reviewed by clinician on day of visit: allergies, medications, problem list, medical history, surgical history, family history, social history, and previous encounter notes pertinent to obesity diagnosis.   I have personally spent 30 minutes total time today in preparation, patient care, nutritional counseling and documentation for this visit, including the following: review of clinical lab tests; review of medical tests/procedures/services.      Elizabeth Brink, DO DABFM, DABOM [facility] [address] [place], [place] [address] [phone]

## 2023-03-20 NOTE — Assessment & Plan Note (Signed)
She is still experiencing cravings for carbs and sugar likely worsened by hyperinsulinemia and indulging more frequently on refined carbohydrates and ultra processed foods.  She did start on metformin XR 500 mg once daily with food but has had some abdominal cramping and diarrhea despite what she eats.  She has moved her metformin XR to q. OD.  Continue to work on prescribed meal plan Will be working to increase exercise frequency in the coming months Change metformin to short acting metformin 250 mg once daily with food

## 2023-03-20 NOTE — Assessment & Plan Note (Signed)
Last vitamin D Lab Results  Component Value Date   VD25OH 23.4 (L) 02/01/2023   She is taking vitamin D 50,000 IU once weekly.  Energy level is starting to improve.  Continue vitamin D 50,000 IU once weekly.  Recheck level in February

## 2023-03-20 NOTE — Assessment & Plan Note (Signed)
Iron deficiency anemia related to dysfunctional uterine bleeding.  She has seen OB/GYN and has been started on Lysteda.  She was unable to get Niferex prescription due to lack of insurance coverage.  She does have iron deficiency anemia.  She has complaints of fatigue.  Recommended over-the-counter iron 65 mg daily mixed with vitamin C

## 2023-03-25 ENCOUNTER — Other Ambulatory Visit: Payer: Self-pay | Admitting: Family Medicine

## 2023-03-25 DIAGNOSIS — E88819 Insulin resistance, unspecified: Secondary | ICD-10-CM

## 2023-04-13 ENCOUNTER — Ambulatory Visit: Payer: BC Managed Care – PPO | Admitting: Family Medicine

## 2023-04-17 ENCOUNTER — Ambulatory Visit
Admission: RE | Admit: 2023-04-17 | Discharge: 2023-04-17 | Disposition: A | Discharge status: Home / Self Care | Payer: BC Managed Care – PPO | Source: Ambulatory Visit | Attending: Obstetrics and Gynecology

## 2023-04-17 DIAGNOSIS — Z1231 Encounter for screening mammogram for malignant neoplasm of breast: Secondary | ICD-10-CM | POA: Diagnosis not present

## 2023-04-17 DIAGNOSIS — Z01419 Encounter for gynecological examination (general) (routine) without abnormal findings: Secondary | ICD-10-CM

## 2023-05-01 ENCOUNTER — Encounter: Payer: Self-pay | Admitting: Sports Medicine

## 2023-05-01 DIAGNOSIS — M5416 Radiculopathy, lumbar region: Secondary | ICD-10-CM

## 2023-05-01 MED ORDER — GABAPENTIN 800 MG PO TABS: ORAL_TABLET | ORAL | 3 refills | Status: DC

## 2023-05-01 MED ORDER — TRAMADOL HCL 50 MG PO TABS: 50.0000 mg | ORAL_TABLET | Freq: Three times a day (TID) | ORAL | 0 refills | Status: AC | PRN

## 2023-05-05 ENCOUNTER — Ambulatory Visit: Payer: BC Managed Care – PPO | Admitting: Sports Medicine

## 2023-05-05 VITALS — BP 158/92 | HR 99

## 2023-05-05 DIAGNOSIS — M47817 Spondylosis without myelopathy or radiculopathy, lumbosacral region: Secondary | ICD-10-CM

## 2023-05-05 DIAGNOSIS — M5416 Radiculopathy, lumbar region: Secondary | ICD-10-CM | POA: Diagnosis not present

## 2023-05-05 MED ORDER — GABAPENTIN 800 MG PO TABS
ORAL_TABLET | ORAL | 3 refills | Status: AC
Start: 2023-05-05 — End: ?

## 2023-05-05 NOTE — Assessment & Plan Note (Signed)
This pleasant 53 year old female returns, she has chronic axial right-sided low back pain with radiation to the buttock, she had no areas of neuroforaminal stenosis on an MRI from 2021, she has also seen Dr. Yevette Edwards who suggested there was no surgical pathology. She was referred to Dr. Regino Schultze for consideration of SI joint RFA but this had not been done. Epidurals did not provide good relief. She did have a diagnostic and therapeutic SI joint injection on the right back in 2023 that did really well. She also is well-controlled with gabapentin and acetaminophen. Refilling gabapentin. Return as needed.

## 2023-05-05 NOTE — Progress Notes (Signed)
    Procedures performed today:    None.  Independent interpretation of notes and tests performed by another provider:   None.  Brief History, Exam, Impression, and Recommendations:    Lumbosacral spondylosis This pleasant 53 year old female returns, she has chronic axial right-sided low back pain with radiation to the buttock, she had no areas of neuroforaminal stenosis on an MRI from 2021, she has also seen Dr. Yevette Edwards who suggested there was no surgical pathology. She was referred to Dr. Regino Schultze for consideration of SI joint RFA but this had not been done. Epidurals did not provide good relief. She did have a diagnostic and therapeutic SI joint injection on the right back in 2023 that did really well. She also is well-controlled with gabapentin and acetaminophen. Refilling gabapentin. Return as needed.    ____________________________________________ Ihor Austin. Benjamin Stain, M.D., ABFM., CAQSM., AME. Primary Care and Sports Medicine [facility]  Adjunct Professor of Family Medicine  [employer]  Restaurant manager, fast food

## 2023-07-17 ENCOUNTER — Ambulatory Visit: Admitting: Family Medicine

## 2023-08-16 IMAGING — DX DG KNEE COMPLETE 4+V*L*
4 series · 4 of 4 positions shown · non-contrast
Comparison: None Available.

CLINICAL DATA: Recently started walking program. Increasing pain of
the medial joint line and patellofemoral joint.

EXAM:
LEFT KNEE - COMPLETE 4+ VIEW; RIGHT KNEE - 1-2 VIEW

[knee lat]
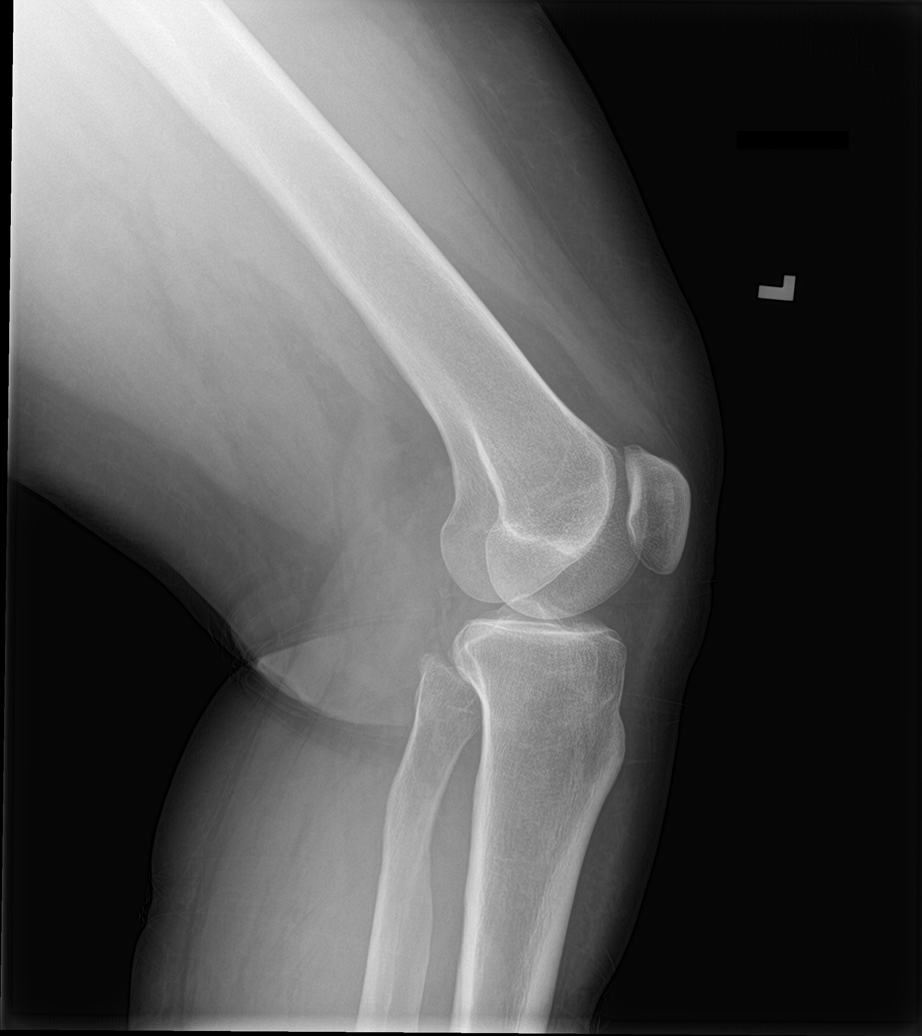

[knee ap bilat standing]
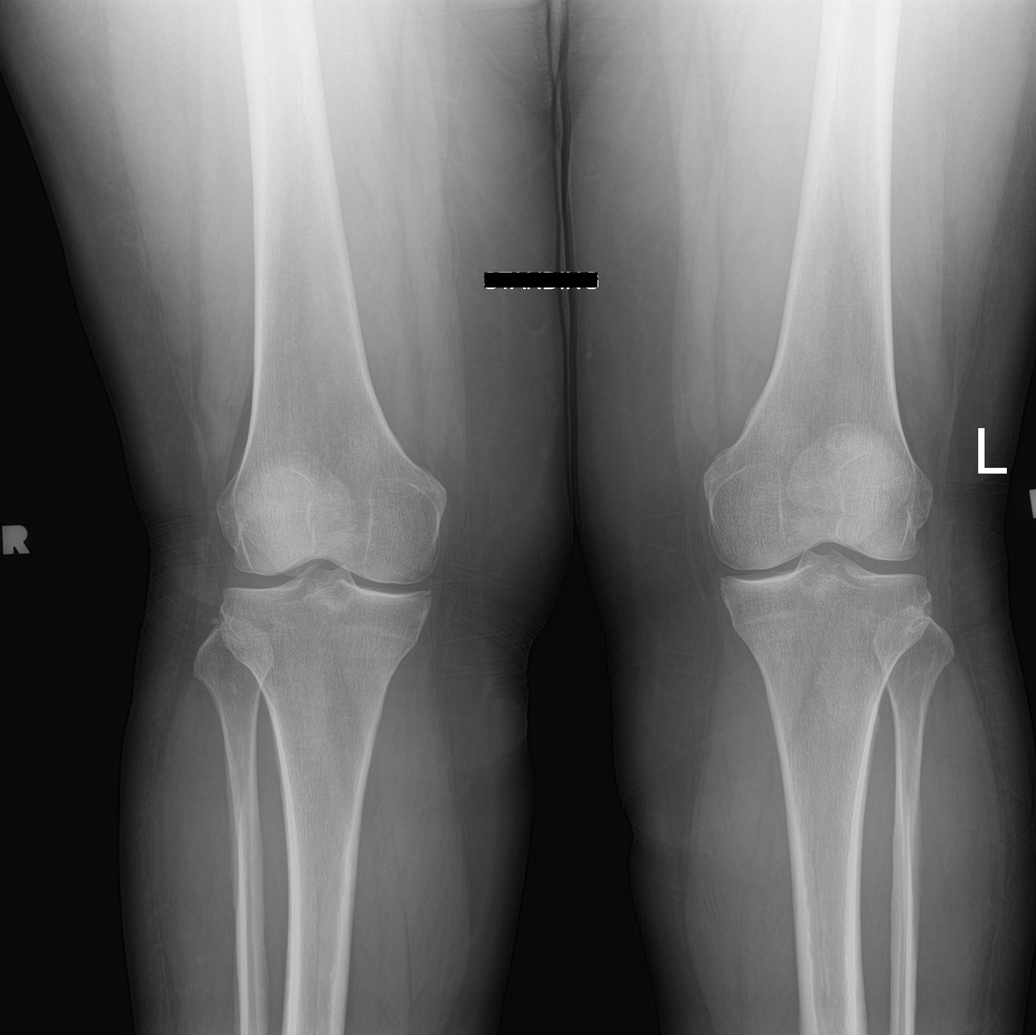

[knee sunrise standing]
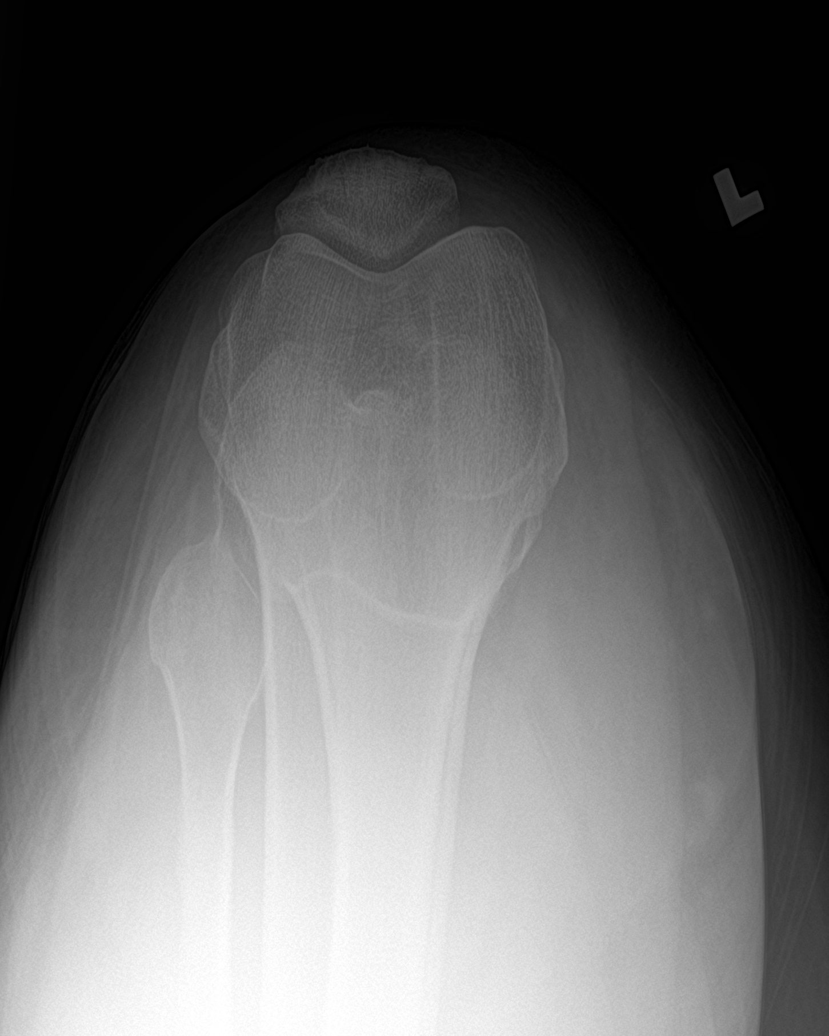

[knee tunnel]
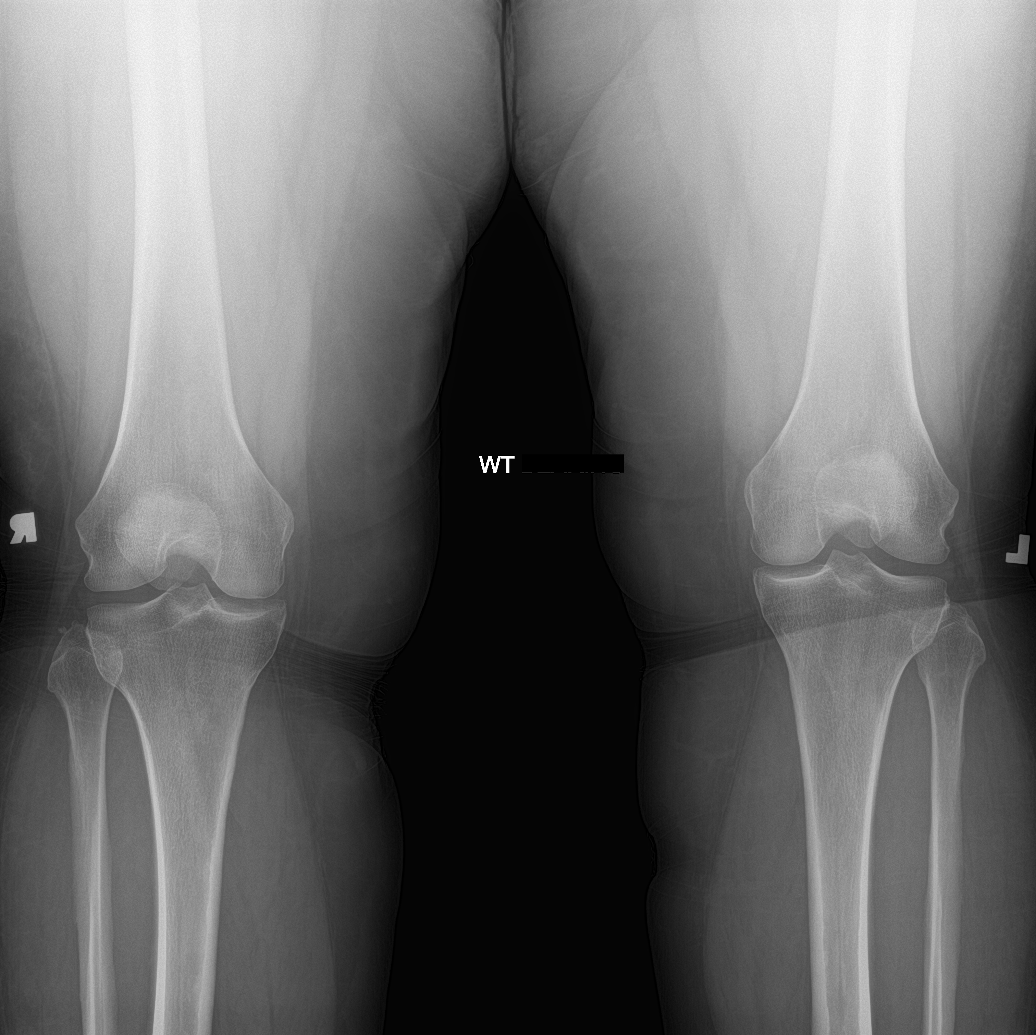

[4 of 4 positions shown; findings below may reference images not displayed]

FINDINGS: Left knee:

Mild medial compartment joint space narrowing. No joint effusion.
Minimal superior patellar degenerative spurring. No acute fracture
or dislocation.

PA and AP views of the right knee demonstrates mild medial
compartment joint space narrowing.
IMPRESSION: Mild bilateral medial compartment joint space narrowing.

Mild superior patellar degenerative osteophytosis.

## 2023-08-16 IMAGING — DX DG KNEE 1-2V*R*
2 series · 2 of 2 positions shown · non-contrast
Comparison: None Available.

CLINICAL DATA: Recently started walking program. Increasing pain of
the medial joint line and patellofemoral joint.

EXAM:
LEFT KNEE - COMPLETE 4+ VIEW; RIGHT KNEE - 1-2 VIEW

[knee ap bilat standing]
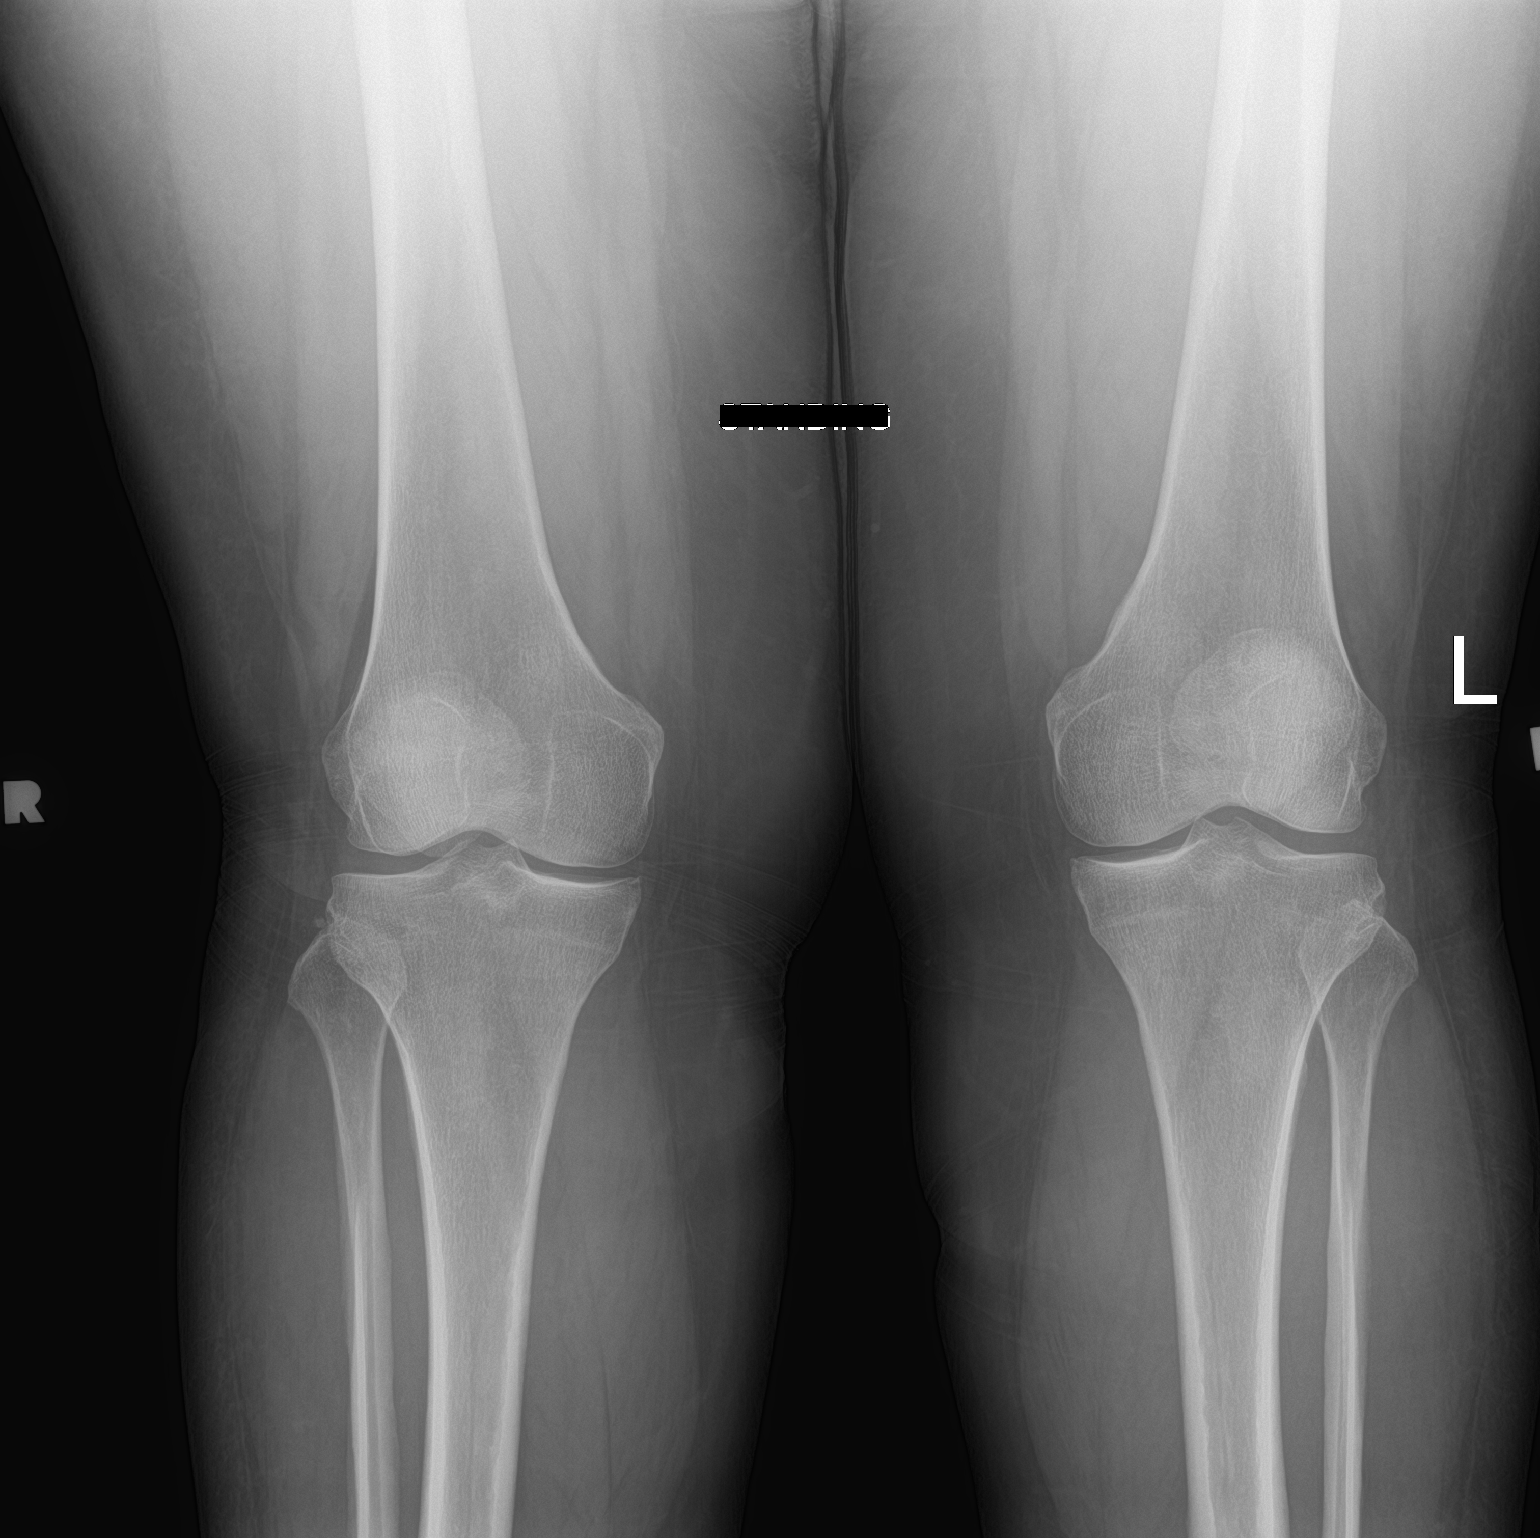

[knee tunnel]
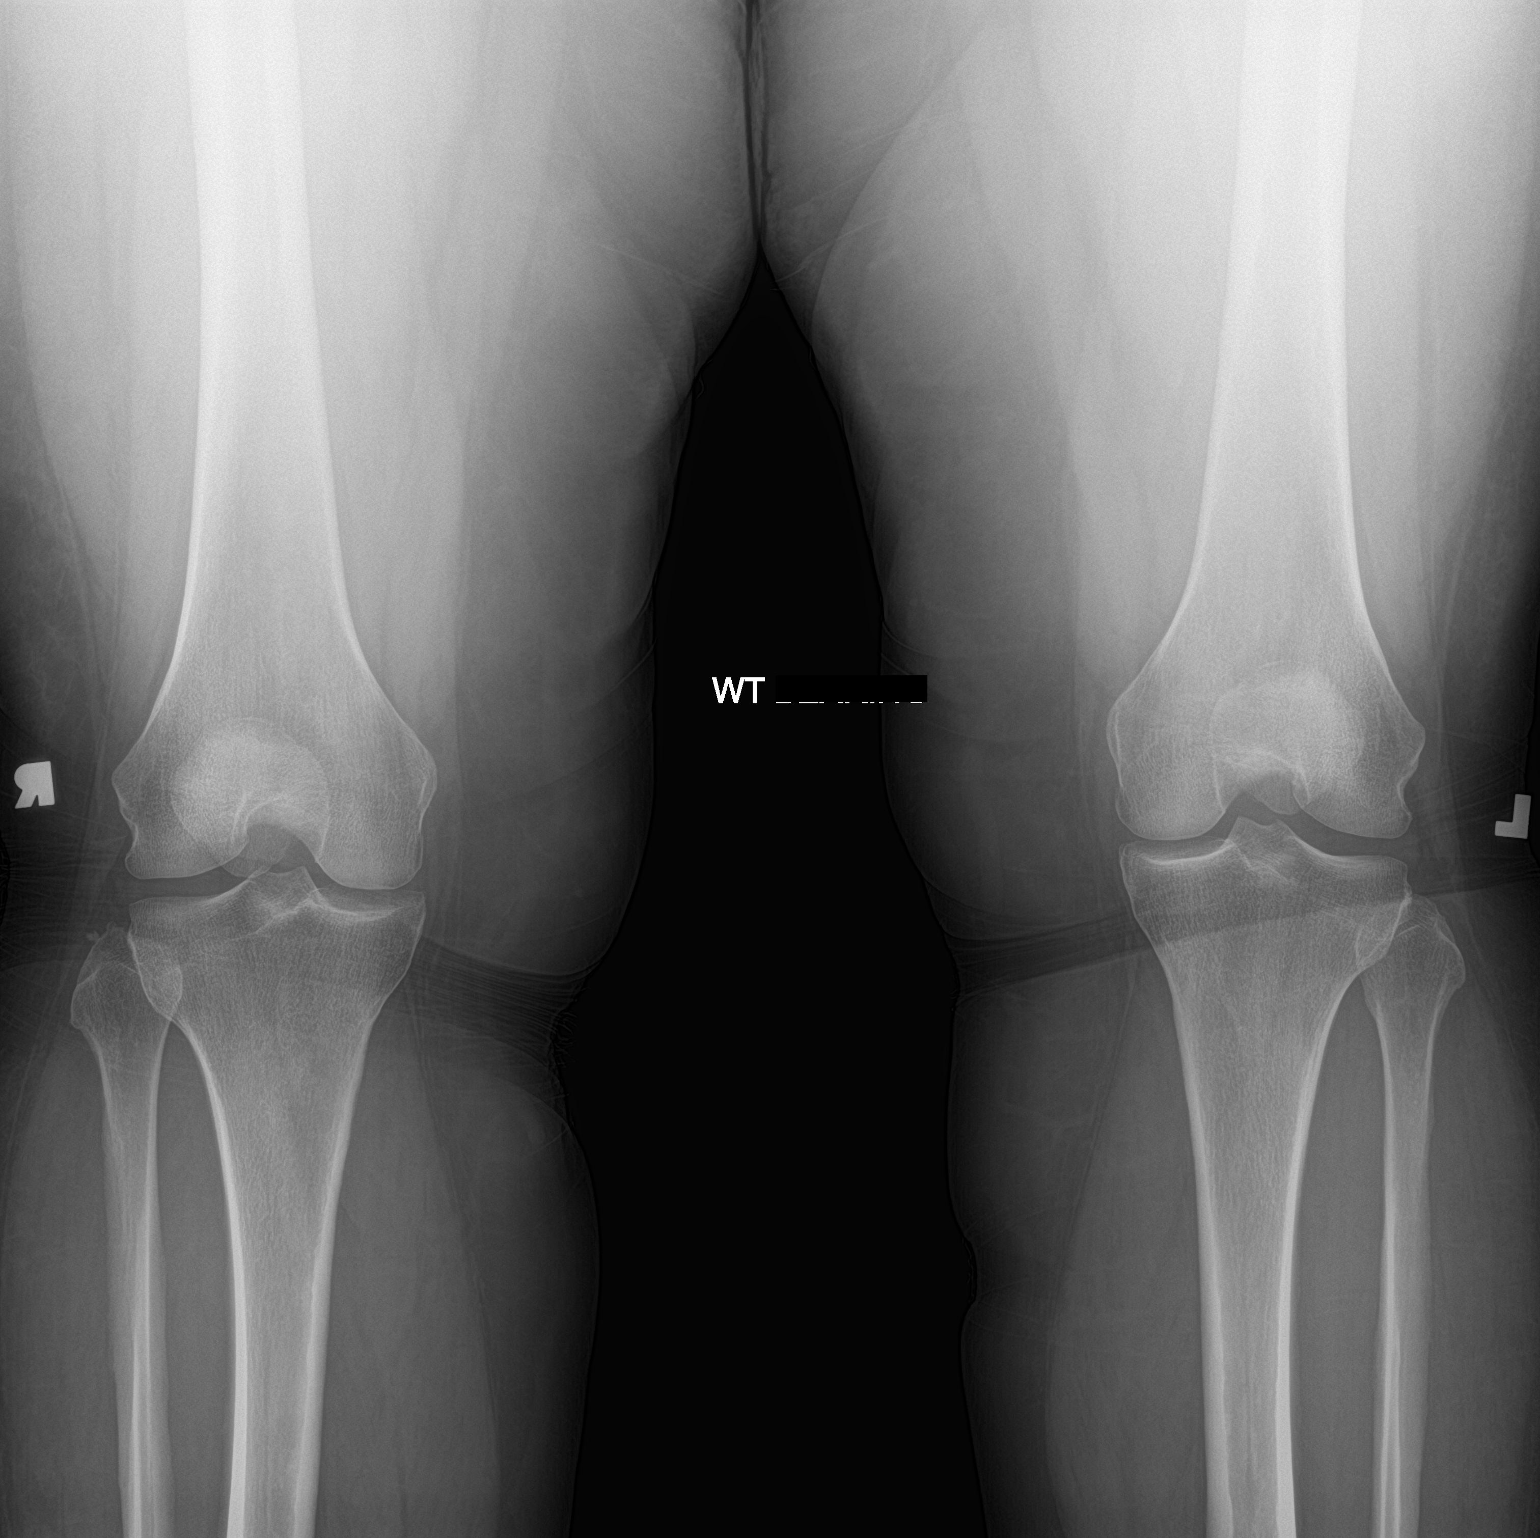

[2 of 2 positions shown; findings below may reference images not displayed]

FINDINGS: Left knee:

Mild medial compartment joint space narrowing. No joint effusion.
Minimal superior patellar degenerative spurring. No acute fracture
or dislocation.

PA and AP views of the right knee demonstrates mild medial
compartment joint space narrowing.
IMPRESSION: Mild bilateral medial compartment joint space narrowing.

Mild superior patellar degenerative osteophytosis.

## 2023-08-25 ENCOUNTER — Other Ambulatory Visit: Payer: Self-pay | Admitting: *Deleted

## 2023-08-25 DIAGNOSIS — I10 Essential (primary) hypertension: Secondary | ICD-10-CM

## 2023-08-25 MED ORDER — LOSARTAN POTASSIUM-HCTZ 50-12.5 MG PO TABS: 1.0000 | ORAL_TABLET | Freq: Every day | ORAL | 1 refills | Status: DC

## 2023-12-05 ENCOUNTER — Encounter: Payer: Self-pay | Admitting: Sports Medicine

## 2023-12-11 ENCOUNTER — Inpatient Hospital Stay: Admitting: Family Medicine

## 2023-12-13 ENCOUNTER — Ambulatory Visit: Admission: EM | Admit: 2023-12-13 | Discharge: 2023-12-13 | Disposition: A | Discharge status: Home / Self Care

## 2023-12-13 DIAGNOSIS — Z4802 Encounter for removal of sutures: Secondary | ICD-10-CM | POA: Diagnosis not present

## 2023-12-13 NOTE — ED Provider Notes (Signed)
 Elizabeth Copeland CARE    CSN: [id number] Arrival date & time: 12/13/23  1759      History   Chief Complaint Chief Complaint  Patient presents with   Suture / Staple Removal    HPI Elizabeth Copeland is a 53 y.o. female.   HPI 53 year old female presents with suture removal from left thumb.  Patient reports sutures were placed Monday, 12/04/2023 at [facility] ED  Past Medical History:  Diagnosis Date   ADD (attention deficit disorder)    Anemia    Anxiety    Back pain    Back pain    Complication of anesthesia    patient has not received anesthesia before but states father and brother have a hard time waking up from anesthesia   Family history of adverse reaction to anesthesia    mother has a hard time waking up and moody    Fatty liver    Fibroid    Fibromyalgia    Headache    Hypertension    Morbid obesity (HCC)    Pneumonia    Rheumatoid arthritis (HCC)    Vitamin D  deficiency     Patient Active Problem List   Diagnosis Date Noted   Polyphagia 03/20/2023   Low folic acid  02/15/2023   Prediabetes 02/15/2023   Mixed hyperlipidemia 02/15/2023   Insulin  resistance 02/15/2023   SOBOE (shortness of breath on exertion) 02/01/2023   Essential hypertension 02/01/2023   NAFLD (nonalcoholic fatty liver disease) 89/69/7975   Depression 02/01/2023   DUB (dysfunctional uterine bleeding) 02/01/2023   Depression screen 02/01/2023   Morbid obesity (HCC) 02/01/2023   Thyroid  nodule 06/27/2022   Encounter for weight management 03/07/2022   Primary osteoarthritis of both knees 08/11/2021   Vertigo 06/09/2021   Carpal tunnel syndrome, bilateral 07/31/2020   Situational mixed anxiety and depressive disorder 06/24/2020   Renal cyst, left 12/25/2019   Synovitis of right hand 12/25/2019   Radiculitis of right cervical region 10/22/2019   Metabolic syndrome 08/14/2018   Fibromyalgia 12/02/2017   Plantar fasciitis of left foot 06/28/2017   Trochanteric  bursitis of right hip 06/28/2017   Fatty liver 09/02/2016   Intramural leiomyoma of uterus 06/03/2015   Vitamin D  deficiency 06/03/2015   Anemia, iron  deficiency 06/03/2015   Microcytic anemia 05/07/2015   Essential hypertension, benign 05/06/2015   Ulnar neuropathy of right upper extremity 02/19/2014   Lumbosacral spondylosis 12/19/2012   Back pain 02/17/2011   BMI 40.0-44.9, adult (HCC) 02/17/2011    Past Surgical History:  Procedure Laterality Date   BREAST BIOPSY Right    benign   CARPAL TUNNEL RELEASE Right    CESAREAN SECTION  2000   DILATION AND CURETTAGE OF UTERUS N/A 09/01/2015   Procedure: DILATATION AND CURETTAGE;  Surgeon: Harland JAYSON Birkenhead, MD;  Location: [facility];  Service: Gynecology;  Laterality: N/A;   IR GENERIC HISTORICAL  11/18/2015   IR US  GUIDE VASC ACCESS RIGHT 11/18/2015 [facility]   IR GENERIC HISTORICAL  11/18/2015   IR ANGIOGRAM SELECTIVE EACH ADDITIONAL VESSEL 11/18/2015 [facility]   IR GENERIC HISTORICAL  11/18/2015   IR EMBO TUMOR ORGAN ISCHEMIA INFARCT INC GUIDE ROADMAPPING 11/18/2015 [facility]   IR GENERIC HISTORICAL  11/18/2015   IR ANGIOGRAM PELVIS SELECTIVE OR SUPRASELECTIVE 11/18/2015 [facility]   IR GENERIC HISTORICAL  11/18/2015   IR ANGIOGRAM SELECTIVE EACH ADDITIONAL VESSEL 11/18/2015 [facility]   IR GENERIC HISTORICAL  11/18/2015   IR ANGIOGRAM PELVIS SELECTIVE OR SUPRASELECTIVE 11/18/2015 [facility]  IR GENERIC HISTORICAL  02/23/2016   IR RADIOLOGIST EVAL & MGMT 02/23/2016 Wilkie Lent, MD [facility]   IR GENERIC HISTORICAL  12/02/2015   IR RADIOLOGIST EVAL & MGMT 12/02/2015 Ozell Specking, MD [facility]   IR RADIOLOGIST EVAL & MGMT  08/24/2016   WISDOM TOOTH EXTRACTION      OB History     Gravida  1   Para  1   Term      Preterm      AB      Living  1      SAB      IAB      Ectopic      Multiple      Live Births               Home Medications    Prior to Admission medications    Medication Sig Start Date End Date Taking? Authorizing Provider  azelastine  (ASTELIN ) 0.1 % nasal spray Place 1 spray into both nostrils 2 (two) times daily as needed. Use in each nostril as directed 09/14/22   Tobie Arleta SQUIBB, MD  cetirizine  (ZYRTEC  ALLERGY ) 10 MG tablet Take 1 tablet (10 mg total) by mouth daily. 09/14/22   Tobie Arleta SQUIBB, MD  DULoxetine  (CYMBALTA ) 30 MG capsule Take 1 capsule (30 mg total) by mouth daily. 12/21/22   Alvan Dorothyann BIRCH, MD  fluticasone  (FLONASE ) 50 MCG/ACT nasal spray Place 2 sprays into both nostrils daily. 09/14/22   Tobie Arleta SQUIBB, MD  gabapentin  (NEURONTIN ) 800 MG tablet TAKE 1 TABLET BY MOUTH IN THE MORNING, 1 TABLET IN THE MIDDAY, 2 TABLETS IN THE EVENING 05/05/23   Curtis Debby PARAS, MD  losartan -hydrochlorothiazide  (HYZAAR) 50-12.5 MG tablet Take 1 tablet by mouth daily. 08/25/23   Alvan Dorothyann BIRCH, MD  metFORMIN  (GLUCOPHAGE ) 500 MG tablet 1/2 tab po once daily with food 03/20/23   Bowen, Darice BRAVO, DO  Olopatadine  HCl 0.2 % SOLN Apply 1 drop to eye daily as needed (itchy watery eyes). 08/17/22   Tobie Arleta SQUIBB, MD  omeprazole  (PRILOSEC) 20 MG capsule Take 1 capsule (20 mg total) by mouth daily. 09/14/22   Tobie Arleta SQUIBB, MD  traMADol  (ULTRAM ) 50 MG tablet Take 1 tablet (50 mg total) by mouth every 8 (eight) hours as needed for moderate pain (pain score 4-6). 05/01/23   Curtis Debby PARAS, MD  tranexamic acid  (LYSTEDA ) 650 MG TABS tablet Take 2 tablets (1,300 mg total) by mouth 3 (three) times daily. Take during menses for a maximum of five days 03/08/23   Cleatus Moccasin, MD  Vitamin D , Ergocalciferol , (DRISDOL ) 1.25 MG (50000 UNIT) CAPS capsule Take 1 capsule (50,000 Units total) by mouth every 7 (seven) days. 03/20/23   Bowen, Darice BRAVO, DO    Family History Family History  Problem Relation Age of Onset   Obesity Mother    Cancer Mother    Thyroid  disease Mother    Lung cancer Mother    Hypertension Mother    Hyperlipidemia Father    Diabetes  Father    Melanoma Father    COPD Father    Sleep apnea Father    Diabetes Brother    Obesity Brother    Hypertension Other     Social History Social History   Tobacco Use   Smoking status: Former    Current packs/day: 0.00    Average packs/day: 0.5 packs/day for 15.0 years (7.5 ttl pk-yrs)    Types: Cigarettes    Start  date: 04/21/1987    Quit date: 04/20/2002    Years since quitting: 21.6    Passive exposure: Past   Smokeless tobacco: Never   Tobacco comments:    Past cigarette exposure, father was a heavy smoker  Vaping Use   Vaping status: Never Used  Substance Use Topics   Alcohol use: Yes    Comment: occ   Drug use: No     Allergies   Lisinopril    Review of Systems Review of Systems  Skin:  Positive for wound.     Physical Exam Triage Vital Signs ED Triage Vitals [12/13/23 1812]  Encounter Vitals Group     BP      Girls Systolic BP Percentile      Girls Diastolic BP Percentile      Boys Systolic BP Percentile      Boys Diastolic BP Percentile      Pulse      Resp      Temp      Temp src      SpO2      Weight      Height      Head Circumference      Peak Flow      Pain Score 0     Pain Loc      Pain Education      Exclude from Growth Chart    No data found.  Updated Vital Signs BP (!) 145/80   Pulse (!) 101   Temp 98.5 F (36.9 C)   Resp 19   SpO2 98%   Visual Acuity Right Eye Distance:   Left Eye Distance:   Bilateral Distance:    Right Eye Near:   Left Eye Near:    Bilateral Near:     Physical Exam Vitals and nursing note reviewed.  Constitutional:      Appearance: Normal appearance. She is normal weight.  HENT:     Head: Normocephalic and atraumatic.     Mouth/Throat:     Mouth: Mucous membranes are moist.     Pharynx: Oropharynx is clear.  Eyes:     Extraocular Movements: Extraocular movements intact.     Conjunctiva/sclera: Conjunctivae normal.     Pupils: Pupils are equal, round, and reactive to light.   Cardiovascular:     Rate and Rhythm: Normal rate and regular rhythm.     Pulses: Normal pulses.     Heart sounds: Normal heart sounds.  Pulmonary:     Effort: Pulmonary effort is normal.     Breath sounds: Normal breath sounds. No wheezing, rhonchi or rales.  Musculoskeletal:        General: Normal range of motion.     Cervical back: Normal range of motion and neck supple.  Skin:    General: Skin is warm and dry.     Comments: Left thumb (palmar aspect of the DIP): 3 intact interrupted sutures removed successfully please see image below  Neurological:     General: No focal deficit present.     Mental Status: She is alert and oriented to person, place, and time.  Psychiatric:        Mood and Affect: Mood normal.        Behavior: Behavior normal.        Thought Content: Thought content normal.      UC Treatments / Results  Labs (all labs ordered are listed, but only abnormal results are displayed) Labs Reviewed - No data to display  EKG  Radiology No results found.  Procedures Procedures (including critical care time)  Medications Ordered in UC Medications - No data to display  Initial Impression / Assessment and Plan / UC Course  I have reviewed the triage vital signs and the nursing notes.  Pertinent labs & imaging results that were available during my care of the patient were reviewed by me and considered in my medical decision making (see chart for details).     MDM: 1.  Visit for suture removal-#3 interrupted sutures removed successfully patient tolerated procedure well. Advised patient if symptoms worsen and/or unresolved please follow-up with your PCP or here for further evaluation.  Patient discharged home, hemodynamically stable. Final Clinical Impressions(s) / UC Diagnoses   Final diagnoses:  Visit for suture removal     Discharge Instructions      Advised patient if symptoms worsen and/or unresolved please follow-up with your PCP or here for  further evaluation.     ED Prescriptions   None    PDMP not reviewed this encounter.   Teddy Sharper, FNP 12/13/23 TRENNA

## 2023-12-13 NOTE — Discharge Instructions (Addendum)
 Advised patient if symptoms worsen and/or unresolved please follow-up with your PCP or here for further evaluation.

## 2023-12-13 NOTE — ED Triage Notes (Addendum)
 Pt presents to uc with need for suture removal. Pt had 3 placed on left thumb 9/1 at [facility].

## 2023-12-13 NOTE — ED Notes (Signed)
3 sutures removed from left thumb

## 2024-01-15 ENCOUNTER — Ambulatory Visit
Admission: EM | Admit: 2024-01-15 | Discharge: 2024-01-15 | Disposition: A | Discharge status: Home / Self Care | Attending: Family Medicine | Admitting: Family Medicine

## 2024-01-15 DIAGNOSIS — R509 Fever, unspecified: Secondary | ICD-10-CM

## 2024-01-15 DIAGNOSIS — J069 Acute upper respiratory infection, unspecified: Secondary | ICD-10-CM

## 2024-01-15 DIAGNOSIS — R059 Cough, unspecified: Secondary | ICD-10-CM

## 2024-01-15 LAB — POC SOFIA SARS ANTIGEN FIA: SARS Coronavirus 2 Ag: NEGATIVE

## 2024-01-15 LAB — POCT INFLUENZA A/B
Influenza A, POC: NEGATIVE
Influenza B, POC: NEGATIVE

## 2024-01-15 LAB — POCT RAPID STREP A (OFFICE): Rapid Strep A Screen: NEGATIVE

## 2024-01-15 MED ORDER — PREDNISONE 20 MG PO TABS: ORAL_TABLET | ORAL | 0 refills | Status: DC

## 2024-01-15 NOTE — ED Provider Notes (Signed)
 Elizabeth Copeland CARE    CSN: [id number] Arrival date & time: 01/15/24  1259      History   Chief Complaint Chief Complaint  Patient presents with   Generalized Body Aches    HPI Elizabeth Copeland is a 53 y.o. female.   HPI 53 year old female presents with influenza-like symptoms (sore throat, body aches, sinus issues, headache and cough) 4 days.  PMH significant for morbid obesity, fibromyalgia, and rheumatoid arthritis.  Past Medical History:  Diagnosis Date   ADD (attention deficit disorder)    Anemia    Anxiety    Back pain    Back pain    Complication of anesthesia    patient has not received anesthesia before but states father and brother have a hard time waking up from anesthesia   Family history of adverse reaction to anesthesia    mother has a hard time waking up and moody    Fatty liver    Fibroid    Fibromyalgia    Headache    Hypertension    Morbid obesity (HCC)    Pneumonia    Rheumatoid arthritis (HCC)    Vitamin D  deficiency     Patient Active Problem List   Diagnosis Date Noted   Polyphagia 03/20/2023   Low folic acid  02/15/2023   Prediabetes 02/15/2023   Mixed hyperlipidemia 02/15/2023   Insulin  resistance 02/15/2023   SOBOE (shortness of breath on exertion) 02/01/2023   Essential hypertension 02/01/2023   NAFLD (nonalcoholic fatty liver disease) 89/69/7975   Depression 02/01/2023   DUB (dysfunctional uterine bleeding) 02/01/2023   Depression screen 02/01/2023   Morbid obesity (HCC) 02/01/2023   Thyroid  nodule 06/27/2022   Encounter for weight management 03/07/2022   Primary osteoarthritis of both knees 08/11/2021   Vertigo 06/09/2021   Carpal tunnel syndrome, bilateral 07/31/2020   Situational mixed anxiety and depressive disorder 06/24/2020   Renal cyst, left 12/25/2019   Synovitis of right hand 12/25/2019   Radiculitis of right cervical region 10/22/2019   Metabolic syndrome 08/14/2018   Fibromyalgia 12/02/2017   Plantar  fasciitis of left foot 06/28/2017   Trochanteric bursitis of right hip 06/28/2017   Fatty liver 09/02/2016   Intramural leiomyoma of uterus 06/03/2015   Vitamin D  deficiency 06/03/2015   Anemia, iron  deficiency 06/03/2015   Microcytic anemia 05/07/2015   Essential hypertension, benign 05/06/2015   Ulnar neuropathy of right upper extremity 02/19/2014   Lumbosacral spondylosis 12/19/2012   Back pain 02/17/2011   BMI 40.0-44.9, adult (HCC) 02/17/2011    Past Surgical History:  Procedure Laterality Date   BREAST BIOPSY Right    benign   CARPAL TUNNEL RELEASE Right    CESAREAN SECTION  2000   DILATION AND CURETTAGE OF UTERUS N/A 09/01/2015   Procedure: DILATATION AND CURETTAGE;  Surgeon: Harland JAYSON Birkenhead, MD;  Location: [facility];  Service: Gynecology;  Laterality: N/A;   IR GENERIC HISTORICAL  11/18/2015   IR US  GUIDE VASC ACCESS RIGHT 11/18/2015 [facility]   IR GENERIC HISTORICAL  11/18/2015   IR ANGIOGRAM SELECTIVE EACH ADDITIONAL VESSEL 11/18/2015 [facility]   IR GENERIC HISTORICAL  11/18/2015   IR EMBO TUMOR ORGAN ISCHEMIA INFARCT INC GUIDE ROADMAPPING 11/18/2015 [facility]   IR GENERIC HISTORICAL  11/18/2015   IR ANGIOGRAM PELVIS SELECTIVE OR SUPRASELECTIVE 11/18/2015 [facility]   IR GENERIC HISTORICAL  11/18/2015   IR ANGIOGRAM SELECTIVE EACH ADDITIONAL VESSEL 11/18/2015 [facility]   IR GENERIC HISTORICAL  11/18/2015   IR ANGIOGRAM PELVIS SELECTIVE OR  SUPRASELECTIVE 11/18/2015 [facility]   IR GENERIC HISTORICAL  02/23/2016   IR RADIOLOGIST EVAL & MGMT 02/23/2016 Wilkie Lent, MD [facility]   IR GENERIC HISTORICAL  12/02/2015   IR RADIOLOGIST EVAL & MGMT 12/02/2015 Ozell Specking, MD [facility]   IR RADIOLOGIST EVAL & MGMT  08/24/2016   WISDOM TOOTH EXTRACTION      OB History     Gravida  1   Para  1   Term      Preterm      AB      Living  1      SAB      IAB      Ectopic      Multiple      Live Births               Home  Medications    Prior to Admission medications   Medication Sig Start Date End Date Taking? Authorizing Provider  predniSONE  (DELTASONE ) 20 MG tablet Take 3 tabs PO daily x 5 days. 01/15/24  Yes Alishba Naples, FNP  azelastine  (ASTELIN ) 0.1 % nasal spray Place 1 spray into both nostrils 2 (two) times daily as needed. Use in each nostril as directed 09/14/22   Tobie Arleta SQUIBB, MD  cetirizine  (ZYRTEC  ALLERGY ) 10 MG tablet Take 1 tablet (10 mg total) by mouth daily. 09/14/22   Tobie Arleta SQUIBB, MD  DULoxetine  (CYMBALTA ) 30 MG capsule Take 1 capsule (30 mg total) by mouth daily. 12/21/22   Alvan Dorothyann BIRCH, MD  fluticasone  (FLONASE ) 50 MCG/ACT nasal spray Place 2 sprays into both nostrils daily. 09/14/22   Tobie Arleta SQUIBB, MD  gabapentin  (NEURONTIN ) 800 MG tablet TAKE 1 TABLET BY MOUTH IN THE MORNING, 1 TABLET IN THE MIDDAY, 2 TABLETS IN THE EVENING 05/05/23   Curtis Debby PARAS, MD  losartan -hydrochlorothiazide  (HYZAAR) 50-12.5 MG tablet Take 1 tablet by mouth daily. 08/25/23   Alvan Dorothyann BIRCH, MD  metFORMIN  (GLUCOPHAGE ) 500 MG tablet 1/2 tab po once daily with food 03/20/23   Bowen, Darice BRAVO, DO  Olopatadine  HCl 0.2 % SOLN Apply 1 drop to eye daily as needed (itchy watery eyes). 08/17/22   Tobie Arleta SQUIBB, MD  omeprazole  (PRILOSEC) 20 MG capsule Take 1 capsule (20 mg total) by mouth daily. 09/14/22   Tobie Arleta SQUIBB, MD  traMADol  (ULTRAM ) 50 MG tablet Take 1 tablet (50 mg total) by mouth every 8 (eight) hours as needed for moderate pain (pain score 4-6). 05/01/23   Curtis Debby PARAS, MD  tranexamic acid  (LYSTEDA ) 650 MG TABS tablet Take 2 tablets (1,300 mg total) by mouth 3 (three) times daily. Take during menses for a maximum of five days 03/08/23   Cleatus Moccasin, MD  Vitamin D , Ergocalciferol , (DRISDOL ) 1.25 MG (50000 UNIT) CAPS capsule Take 1 capsule (50,000 Units total) by mouth every 7 (seven) days. 03/20/23   Bowen, Darice BRAVO, DO    Family History Family History  Problem Relation Age of  Onset   Obesity Mother    Cancer Mother    Thyroid  disease Mother    Lung cancer Mother    Hypertension Mother    Hyperlipidemia Father    Diabetes Father    Melanoma Father    COPD Father    Sleep apnea Father    Diabetes Brother    Obesity Brother    Hypertension Other     Social History Social History   Tobacco Use   Smoking status: Former  Current packs/day: 0.00    Average packs/day: 0.5 packs/day for 15.0 years (7.5 ttl pk-yrs)    Types: Cigarettes    Start date: 04/21/1987    Quit date: 04/20/2002    Years since quitting: 21.7    Passive exposure: Past   Smokeless tobacco: Never   Tobacco comments:    Past cigarette exposure, father was a heavy smoker  Vaping Use   Vaping status: Never Used  Substance Use Topics   Alcohol use: Yes    Comment: occ   Drug use: No     Allergies   Lisinopril    Review of Systems Review of Systems  Constitutional:  Positive for fatigue and fever.  HENT:  Positive for congestion and sore throat.   Respiratory:  Positive for cough.   Musculoskeletal:  Positive for arthralgias and myalgias.  Neurological:  Positive for headaches.  All other systems reviewed and are negative.    Physical Exam Triage Vital Signs ED Triage Vitals  Encounter Vitals Group     BP      Girls Systolic BP Percentile      Girls Diastolic BP Percentile      Boys Systolic BP Percentile      Boys Diastolic BP Percentile      Pulse      Resp      Temp      Temp src      SpO2      Weight      Height      Head Circumference      Peak Flow      Pain Score      Pain Loc      Pain Education      Exclude from Growth Chart    No data found.  Updated Vital Signs BP (!) 145/77   Pulse (!) 104   Temp 99.8 F (37.7 C)   Resp 19   LMP 12/16/2023   SpO2 98%     Physical Exam Vitals and nursing note reviewed.  Constitutional:      Appearance: Normal appearance. She is normal weight. She is not ill-appearing.  HENT:     Head:  Normocephalic and atraumatic.     Right Ear: Tympanic membrane, ear canal and external ear normal.     Left Ear: Tympanic membrane, ear canal and external ear normal.     Mouth/Throat:     Mouth: Mucous membranes are moist.     Pharynx: Oropharynx is clear.  Eyes:     Extraocular Movements: Extraocular movements intact.     Conjunctiva/sclera: Conjunctivae normal.     Pupils: Pupils are equal, round, and reactive to light.  Cardiovascular:     Rate and Rhythm: Normal rate and regular rhythm.     Pulses: Normal pulses.     Heart sounds: Normal heart sounds.  Pulmonary:     Effort: Pulmonary effort is normal.     Breath sounds: Normal breath sounds. No wheezing, rhonchi or rales.  Musculoskeletal:        General: Normal range of motion.  Skin:    General: Skin is warm and dry.  Neurological:     General: No focal deficit present.     Mental Status: She is alert and oriented to person, place, and time. Mental status is at baseline.  Psychiatric:        Mood and Affect: Mood normal.        Behavior: Behavior normal.      UC  Treatments / Results  Labs (all labs ordered are listed, but only abnormal results are displayed) Labs Reviewed  POCT RAPID STREP A (OFFICE) - Normal  POCT INFLUENZA A/B  POC SOFIA SARS ANTIGEN FIA    EKG   Radiology No results found.  Procedures Procedures (including critical care time)  Medications Ordered in UC Medications - No data to display  Initial Impression / Assessment and Plan / UC Course  I have reviewed the triage vital signs and the nursing notes.  Pertinent labs & imaging results that were available during my care of the patient were reviewed by me and considered in my medical decision making (see chart for details).     MDM: 1.  Viral URI-COVID-19, flu, and strep negative; 2.  Fever unspecified- Advised patient may take OTC Tylenol  1000 mg every 6 hours for fever (oral temperature greater than 100.3).  3.  Cough, unspecified  type-Rx'd prednisone  20 mg tablet: Take 3 tablets p.o. daily x 5 days. Advised patient test were negative today.  Advised patient to take medication with food to completion.  Advised patient may take OTC Tylenol  1000 mg every 6 hours for fever (oral temperature greater than 100.3).  Encouraged increase daily water intake to 64 ounces per day when taking this occasion.  Advised if symptoms worsen and/or unresolved please follow-up with your PCP or here for further evaluation.  Patient discharged home, hemodynamic stable. Final Clinical Impressions(s) / UC Diagnoses   Final diagnoses:  Viral URI  Fever, unspecified  Cough, unspecified type     Discharge Instructions      Advised patient test were negative today.  Advised patient to take medication with food to completion.  Advised patient may take OTC Tylenol  1000 mg every 6 hours for fever (oral temperature greater than 100.3).  Encouraged increase daily water intake to 64 ounces per day when taking this occasion.  Advised if symptoms worsen and/or unresolved please follow-up with your PCP or here for further evaluation.     ED Prescriptions     Medication Sig Dispense Auth. Provider   predniSONE  (DELTASONE ) 20 MG tablet Take 3 tabs PO daily x 5 days. 15 tablet Mathilde Mcwherter, FNP      PDMP not reviewed this encounter.   Teddy Sharper, FNP 01/15/24 1428

## 2024-01-15 NOTE — Discharge Instructions (Addendum)
 Advised patient test were negative today.  Advised patient to take medication with food to completion.  Advised patient may take OTC Tylenol  1000 mg every 6 hours for fever (oral temperature greater than 100.3).  Encouraged increase daily water intake to 64 ounces per day when taking this occasion.  Advised if symptoms worsen and/or unresolved please follow-up with your PCP or here for further evaluation.

## 2024-01-15 NOTE — ED Triage Notes (Signed)
 Pt presents to uc with co sore throat, otalgia, ear fullness,body aches since Saturday. Pt reports daughter was seen here on Saturday for same and was told it was viral. Pt reports Advil  at home.

## 2024-01-21 ENCOUNTER — Encounter: Payer: Self-pay | Admitting: Emergency Medicine

## 2024-01-21 ENCOUNTER — Other Ambulatory Visit: Payer: Self-pay

## 2024-01-21 ENCOUNTER — Ambulatory Visit
Admission: EM | Admit: 2024-01-21 | Discharge: 2024-01-21 | Disposition: A | Discharge status: Home / Self Care | Attending: Family Medicine | Admitting: Family Medicine

## 2024-01-21 ENCOUNTER — Ambulatory Visit

## 2024-01-21 DIAGNOSIS — R059 Cough, unspecified: Secondary | ICD-10-CM

## 2024-01-21 DIAGNOSIS — J069 Acute upper respiratory infection, unspecified: Secondary | ICD-10-CM

## 2024-01-21 MED ORDER — DOXYCYCLINE HYCLATE 100 MG PO CAPS: 100.0000 mg | ORAL_CAPSULE | Freq: Two times a day (BID) | ORAL | 0 refills | Status: AC

## 2024-01-21 MED ORDER — HYDROCODONE BIT-HOMATROP MBR 5-1.5 MG/5ML PO SOLN: 5.0000 mL | Freq: Four times a day (QID) | ORAL | 0 refills | Status: AC | PRN

## 2024-01-21 NOTE — ED Triage Notes (Addendum)
 Patient presents to Urgent Care with complaints of productive cough, sore throat since 6 days ago. Patient reports was seen 1 week ago. Was taking Prednisone  for cough, has not helped. Sore throat is worsening. Taking Robitussin and Delsym for cough with minimum relief. Has tried Flonase  but burned sinuses

## 2024-01-21 NOTE — Discharge Instructions (Addendum)
 Patient advised of chest x-ray results with hardcopy provided.  Advised patient take medications as directed with food to completion.  Advised patient may take Hycodan cough syrup at night prior to sleep due to sedative effects.  Encouraged to increase daily water intake to 64 ounces per day while taking these medications.  Advised if symptoms worsen and/or unresolved please follow-up with your PCP or here for further evaluation.

## 2024-01-21 NOTE — ED Provider Notes (Signed)
 [facility]    CSN: [id number] Arrival date & time: 01/21/24  9185      History   Chief Complaint Chief Complaint  Patient presents with   Cough    HPI Elizabeth Copeland is a 53 y.o. female.   HPI 53 year old female returns for cough, body aches runny nose, sore throat for 1 week.  Patient evaluated by me on 01/15/2024 please see epic for that encounter note.  Patient reports prednisone  and OTC cough syrups are not working currently.  PMH significant for morbid obesity, fibromyalgia, and rheumatoid arthritis.  Is accompanied by her daughter this morning.  Past Medical History:  Diagnosis Date   ADD (attention deficit disorder)    Anemia    Anxiety    Back pain    Back pain    Complication of anesthesia    patient has not received anesthesia before but states father and brother have a hard time waking up from anesthesia   Family history of adverse reaction to anesthesia    mother has a hard time waking up and moody    Fatty liver    Fibroid    Fibromyalgia    Headache    Hypertension    Morbid obesity (HCC)    Pneumonia    Rheumatoid arthritis (HCC)    Vitamin D  deficiency     Patient Active Problem List   Diagnosis Date Noted   Polyphagia 03/20/2023   Low folic acid  02/15/2023   Prediabetes 02/15/2023   Mixed hyperlipidemia 02/15/2023   Insulin  resistance 02/15/2023   SOBOE (shortness of breath on exertion) 02/01/2023   Essential hypertension 02/01/2023   NAFLD (nonalcoholic fatty liver disease) 89/69/7975   Depression 02/01/2023   DUB (dysfunctional uterine bleeding) 02/01/2023   Depression screen 02/01/2023   Morbid obesity (HCC) 02/01/2023   Thyroid  nodule 06/27/2022   Encounter for weight management 03/07/2022   Primary osteoarthritis of both knees 08/11/2021   Vertigo 06/09/2021   Carpal tunnel syndrome, bilateral 07/31/2020   Situational mixed anxiety and depressive disorder 06/24/2020   Renal cyst, left 12/25/2019   Synovitis of  right hand 12/25/2019   Radiculitis of right cervical region 10/22/2019   Metabolic syndrome 08/14/2018   Fibromyalgia 12/02/2017   Plantar fasciitis of left foot 06/28/2017   Trochanteric bursitis of right hip 06/28/2017   Fatty liver 09/02/2016   Intramural leiomyoma of uterus 06/03/2015   Vitamin D  deficiency 06/03/2015   Anemia, iron  deficiency 06/03/2015   Microcytic anemia 05/07/2015   Essential hypertension, benign 05/06/2015   Ulnar neuropathy of right upper extremity 02/19/2014   Lumbosacral spondylosis 12/19/2012   Back pain 02/17/2011   BMI 40.0-44.9, adult (HCC) 02/17/2011    Past Surgical History:  Procedure Laterality Date   BREAST BIOPSY Right    benign   CARPAL TUNNEL RELEASE Right    CESAREAN SECTION  2000   DILATION AND CURETTAGE OF UTERUS N/A 09/01/2015   Procedure: DILATATION AND CURETTAGE;  Surgeon: Harland JAYSON Birkenhead, MD;  Location: [facility];  Service: Gynecology;  Laterality: N/A;   IR GENERIC HISTORICAL  11/18/2015   IR US  GUIDE VASC ACCESS RIGHT 11/18/2015 [facility]   IR GENERIC HISTORICAL  11/18/2015   IR ANGIOGRAM SELECTIVE EACH ADDITIONAL VESSEL 11/18/2015 [facility]   IR GENERIC HISTORICAL  11/18/2015   IR EMBO TUMOR ORGAN ISCHEMIA INFARCT INC GUIDE ROADMAPPING 11/18/2015 [facility]   IR GENERIC HISTORICAL  11/18/2015   IR ANGIOGRAM PELVIS SELECTIVE OR SUPRASELECTIVE 11/18/2015 [facility]   IR  GENERIC HISTORICAL  11/18/2015   IR ANGIOGRAM SELECTIVE EACH ADDITIONAL VESSEL 11/18/2015 [facility]   IR GENERIC HISTORICAL  11/18/2015   IR ANGIOGRAM PELVIS SELECTIVE OR SUPRASELECTIVE 11/18/2015 [facility]   IR GENERIC HISTORICAL  02/23/2016   IR RADIOLOGIST EVAL & MGMT 02/23/2016 Wilkie Lent, MD [facility]   IR GENERIC HISTORICAL  12/02/2015   IR RADIOLOGIST EVAL & MGMT 12/02/2015 Ozell Specking, MD [facility]   IR RADIOLOGIST EVAL & MGMT  08/24/2016   WISDOM TOOTH EXTRACTION      OB History     Gravida  1   Para  1   Term       Preterm      AB      Living  1      SAB      IAB      Ectopic      Multiple      Live Births               Home Medications    Prior to Admission medications   Medication Sig Start Date End Date Taking? Authorizing Provider  doxycycline (VIBRAMYCIN) 100 MG capsule Take 1 capsule (100 mg total) by mouth 2 (two) times daily for 7 days. 01/21/24 01/28/24 Yes Teddy Ozell, FNP  DULoxetine  (CYMBALTA ) 30 MG capsule Take 1 capsule (30 mg total) by mouth daily. 12/21/22  Yes Alvan Dorothyann BIRCH, MD  fluticasone  (FLONASE ) 50 MCG/ACT nasal spray Place 2 sprays into both nostrils daily. 09/14/22  Yes Patel, Arleta SQUIBB, MD  gabapentin  (NEURONTIN ) 800 MG tablet TAKE 1 TABLET BY MOUTH IN THE MORNING, 1 TABLET IN THE MIDDAY, 2 TABLETS IN THE EVENING 05/05/23  Yes Curtis Debby PARAS, MD  HYDROcodone  bit-homatropine (HYCODAN) 5-1.5 MG/5ML syrup Take 5 mLs by mouth every 6 (six) hours as needed for cough. 01/21/24  Yes Teddy Ozell, FNP  losartan -hydrochlorothiazide  (HYZAAR) 50-12.5 MG tablet Take 1 tablet by mouth daily. 08/25/23  Yes Alvan Dorothyann BIRCH, MD  metFORMIN  (GLUCOPHAGE ) 500 MG tablet 1/2 tab po once daily with food 03/20/23  Yes Bowen, Darice BRAVO, DO  Olopatadine  HCl 0.2 % SOLN Apply 1 drop to eye daily as needed (itchy watery eyes). 08/17/22  Yes Tobie Arleta SQUIBB, MD  omeprazole  (PRILOSEC) 20 MG capsule Take 1 capsule (20 mg total) by mouth daily. 09/14/22  Yes Tobie Arleta SQUIBB, MD  traMADol  (ULTRAM ) 50 MG tablet Take 1 tablet (50 mg total) by mouth every 8 (eight) hours as needed for moderate pain (pain score 4-6). 05/01/23  Yes Curtis Debby PARAS, MD  tranexamic acid  (LYSTEDA ) 650 MG TABS tablet Take 2 tablets (1,300 mg total) by mouth 3 (three) times daily. Take during menses for a maximum of five days 03/08/23  Yes Cleatus Moccasin, MD  Vitamin D , Ergocalciferol , (DRISDOL ) 1.25 MG (50000 UNIT) CAPS capsule Take 1 capsule (50,000 Units total) by mouth every 7 (seven) days.  03/20/23  Yes Bowen, Darice BRAVO, DO  azelastine  (ASTELIN ) 0.1 % nasal spray Place 1 spray into both nostrils 2 (two) times daily as needed. Use in each nostril as directed 09/14/22   Tobie Arleta SQUIBB, MD  cetirizine  (ZYRTEC  ALLERGY ) 10 MG tablet Take 1 tablet (10 mg total) by mouth daily. 09/14/22   Tobie Arleta SQUIBB, MD    Family History Family History  Problem Relation Age of Onset   Obesity Mother    Cancer Mother    Thyroid  disease Mother    Lung cancer Mother  Hypertension Mother    Hyperlipidemia Father    Diabetes Father    Melanoma Father    COPD Father    Sleep apnea Father    Diabetes Brother    Obesity Brother    Hypertension Other     Social History Social History   Tobacco Use   Smoking status: Former    Current packs/day: 0.00    Average packs/day: 0.5 packs/day for 15.0 years (7.5 ttl pk-yrs)    Types: Cigarettes    Start date: 04/21/1987    Quit date: 04/20/2002    Years since quitting: 21.7    Passive exposure: Past   Smokeless tobacco: Never   Tobacco comments:    Past cigarette exposure, father was a heavy smoker  Vaping Use   Vaping status: Never Used  Substance Use Topics   Alcohol use: Yes    Comment: occ   Drug use: No     Allergies   Lisinopril    Review of Systems Review of Systems  All other systems reviewed and are negative.    Physical Exam Triage Vital Signs ED Triage Vitals  Encounter Vitals Group     BP      Girls Systolic BP Percentile      Girls Diastolic BP Percentile      Boys Systolic BP Percentile      Boys Diastolic BP Percentile      Pulse      Resp      Temp      Temp src      SpO2      Weight      Height      Head Circumference      Peak Flow      Pain Score      Pain Loc      Pain Education      Exclude from Growth Chart    No data found.  Updated Vital Signs BP 133/79 (BP Location: Right Arm)   Pulse 95   Temp 99 F (37.2 C) (Oral)   Resp 18   LMP 12/16/2023   SpO2 94%    Physical Exam Vitals  and nursing note reviewed.  Constitutional:      General: She is not in acute distress.    Appearance: Normal appearance. She is obese. She is not ill-appearing.  HENT:     Head: Normocephalic and atraumatic.     Right Ear: Tympanic membrane, ear canal and external ear normal.     Left Ear: Tympanic membrane, ear canal and external ear normal.     Mouth/Throat:     Mouth: Mucous membranes are moist.     Pharynx: Oropharynx is clear.  Eyes:     Extraocular Movements: Extraocular movements intact.     Conjunctiva/sclera: Conjunctivae normal.     Pupils: Pupils are equal, round, and reactive to light.  Cardiovascular:     Rate and Rhythm: Normal rate and regular rhythm.     Heart sounds: Normal heart sounds. No murmur heard. Pulmonary:     Effort: Pulmonary effort is normal.     Breath sounds: No wheezing, rhonchi or rales.     Comments: Diminished breath sounds noted throughout, with frequent nonproductive cough on exam Musculoskeletal:        General: Normal range of motion.     Cervical back: Normal range of motion and neck supple.  Skin:    General: Skin is warm and dry.  Neurological:     General:  No focal deficit present.     Mental Status: She is alert and oriented to person, place, and time. Mental status is at baseline.  Psychiatric:        Mood and Affect: Mood normal.        Behavior: Behavior normal.      UC Treatments / Results  Labs (all labs ordered are listed, but only abnormal results are displayed) Labs Reviewed - No data to display  EKG   Radiology DG Chest 2 View Result Date: 01/21/2024 CLINICAL DATA:  Cough. EXAM: CHEST - 2 VIEW COMPARISON:  06/27/2022 FINDINGS: Cardiopericardial silhouette is at upper limits of normal for size. The lungs are clear without focal pneumonia, edema, pneumothorax or pleural effusion. No acute bony abnormality. IMPRESSION: Stable.  No active cardiopulmonary disease. Electronically Signed   By: Camellia Candle M.D.   On:  01/21/2024 09:06    Procedures Procedures (including critical care time)  Medications Ordered in UC Medications - No data to display  Initial Impression / Assessment and Plan / UC Course  I have reviewed the triage vital signs and the nursing notes.  Pertinent labs & imaging results that were available during my care of the patient were reviewed by me and considered in my medical decision making (see chart for details).     MDM: 1.  Acute upper respiratory infection-Rx'd doxycycline 100 mg capsule: Take 1 capsule twice daily x 7 days; 2.  Cough, unspecified type-CXR results revealed above, patient advised, Rx'd Hycodan 5-1.5 mg / 5 mL syrup: Take 5 mL every 6 hours, as needed for cough.  Patient advised of sedative  effects of this medication. Advised patient take medications as directed with food to completion.  Advised patient may take Hycodan cough syrup at night prior to sleep due to sedative effects.  Encouraged to increase daily water intake to 64 ounces per day while taking these medications.  Advised if symptoms worsen and/or unresolved please follow-up with your PCP or here for further evaluation.  Patient discharged home, hemodynamically stable. Final Clinical Impressions(s) / UC Diagnoses   Final diagnoses:  Cough, unspecified type  Acute upper respiratory infection     Discharge Instructions      Patient advised of chest x-ray results with hardcopy provided.  Advised patient take medications as directed with food to completion.  Advised patient may take Hycodan cough syrup at night prior to sleep due to sedative effects.  Encouraged to increase daily water intake to 64 ounces per day while taking these medications.  Advised if symptoms worsen and/or unresolved please follow-up with your PCP or here for further evaluation.     ED Prescriptions     Medication Sig Dispense Auth. Provider   doxycycline (VIBRAMYCIN) 100 MG capsule Take 1 capsule (100 mg total) by mouth 2  (two) times daily for 7 days. 14 capsule Rehan Holness, FNP   HYDROcodone  bit-homatropine (HYCODAN) 5-1.5 MG/5ML syrup Take 5 mLs by mouth every 6 (six) hours as needed for cough. 120 mL Teddy Sharper, FNP      I have reviewed the PDMP during this encounter.   Teddy Sharper, FNP 01/21/24 [phone]

## 2024-03-15 ENCOUNTER — Encounter: Payer: Self-pay | Admitting: Family Medicine

## 2024-03-15 DIAGNOSIS — M5416 Radiculopathy, lumbar region: Secondary | ICD-10-CM

## 2024-03-18 ENCOUNTER — Other Ambulatory Visit (HOSPITAL_BASED_OUTPATIENT_CLINIC_OR_DEPARTMENT_OTHER): Payer: Self-pay

## 2024-03-18 MED ORDER — GABAPENTIN 800 MG PO TABS
ORAL_TABLET | ORAL | 3 refills | Status: DC
  Filled 2024-03-18: qty 360, 90d supply, fill #0

## 2024-04-05 MED ORDER — GABAPENTIN 800 MG PO TABS: ORAL_TABLET | ORAL | 3 refills | Status: AC

## 2024-04-05 NOTE — Addendum Note (Signed)
 Addended by: BONNY JON DEL on: 04/05/2024 08:45 AM   Modules accepted: Orders

## 2024-04-06 ENCOUNTER — Other Ambulatory Visit: Payer: Self-pay | Admitting: Family Medicine

## 2024-04-06 DIAGNOSIS — I10 Essential (primary) hypertension: Secondary | ICD-10-CM

## 2024-04-10 NOTE — Telephone Encounter (Signed)
 Please call pt she is overdue for OV for bp, labs, and medication refills. Thanks.

## 2024-05-07 ENCOUNTER — Ambulatory Visit: Admitting: Family Medicine
